# Patient Record
Sex: Female | Born: 1955
Health system: Southern US, Community
[De-identification: ages and names within clinical notes are randomized; demographics above are authoritative.]

## PROBLEM LIST (undated history)

## (undated) DIAGNOSIS — Z972 Presence of dental prosthetic device (complete) (partial): Secondary | ICD-10-CM

## (undated) DIAGNOSIS — E079 Disorder of thyroid, unspecified: Secondary | ICD-10-CM

## (undated) DIAGNOSIS — Z8719 Personal history of other diseases of the digestive system: Secondary | ICD-10-CM

## (undated) DIAGNOSIS — E119 Type 2 diabetes mellitus without complications: Secondary | ICD-10-CM

## (undated) DIAGNOSIS — K219 Gastro-esophageal reflux disease without esophagitis: Secondary | ICD-10-CM

## (undated) DIAGNOSIS — E785 Hyperlipidemia, unspecified: Secondary | ICD-10-CM

## (undated) DIAGNOSIS — F32A Depression, unspecified: Secondary | ICD-10-CM

## (undated) DIAGNOSIS — E039 Hypothyroidism, unspecified: Secondary | ICD-10-CM

## (undated) DIAGNOSIS — M1990 Unspecified osteoarthritis, unspecified site: Secondary | ICD-10-CM

## (undated) DIAGNOSIS — T4145XA Adverse effect of unspecified anesthetic, initial encounter: Secondary | ICD-10-CM

## (undated) DIAGNOSIS — R7303 Prediabetes: Secondary | ICD-10-CM

## (undated) DIAGNOSIS — I10 Essential (primary) hypertension: Secondary | ICD-10-CM

## (undated) DIAGNOSIS — R011 Cardiac murmur, unspecified: Secondary | ICD-10-CM

## (undated) DIAGNOSIS — F329 Major depressive disorder, single episode, unspecified: Secondary | ICD-10-CM

## (undated) DIAGNOSIS — T8859XA Other complications of anesthesia, initial encounter: Secondary | ICD-10-CM

## (undated) DIAGNOSIS — G5600 Carpal tunnel syndrome, unspecified upper limb: Secondary | ICD-10-CM

## (undated) HISTORY — PX: VAGINAL HYSTERECTOMY: SUR661

## (undated) HISTORY — DX: Hyperlipidemia, unspecified: E78.5

## (undated) HISTORY — PX: LYMPH NODE DISSECTION: SHX5087

## (undated) HISTORY — PX: KNEE SURGERY: SHX244

## (undated) HISTORY — DX: Depression, unspecified: F32.A

## (undated) HISTORY — DX: Major depressive disorder, single episode, unspecified: F32.9

## (undated) HISTORY — DX: Disorder of thyroid, unspecified: E07.9

## (undated) HISTORY — PX: TONSILLECTOMY: SUR1361

---

## 2005-08-16 ENCOUNTER — Ambulatory Visit: Payer: Self-pay | Admitting: Family Medicine

## 2008-06-11 ENCOUNTER — Ambulatory Visit: Payer: Self-pay | Admitting: Family Medicine

## 2008-11-29 ENCOUNTER — Ambulatory Visit: Payer: Self-pay | Admitting: Cardiovascular Disease

## 2010-03-12 HISTORY — PX: COLONOSCOPY: SHX174

## 2010-12-06 LAB — HM COLONOSCOPY

## 2011-03-13 HISTORY — PX: GASTRIC BYPASS: SHX52

## 2011-06-27 ENCOUNTER — Ambulatory Visit: Payer: Self-pay | Admitting: Family Medicine

## 2014-06-29 ENCOUNTER — Ambulatory Visit
Admit: 2014-06-29 | Disposition: A | Discharge status: Home / Self Care | Payer: Self-pay | Attending: Family Medicine | Admitting: Family Medicine

## 2015-01-05 ENCOUNTER — Ambulatory Visit (INDEPENDENT_AMBULATORY_CARE_PROVIDER_SITE_OTHER): Payer: BLUE CROSS/BLUE SHIELD | Admitting: Family Medicine

## 2015-01-05 ENCOUNTER — Encounter: Payer: Self-pay | Admitting: Family Medicine

## 2015-01-05 ENCOUNTER — Other Ambulatory Visit: Payer: Self-pay

## 2015-01-05 VITALS — BP 130/60 | HR 84 | Ht 63.0 in | Wt 176.0 lb

## 2015-01-05 DIAGNOSIS — E039 Hypothyroidism, unspecified: Secondary | ICD-10-CM | POA: Diagnosis not present

## 2015-01-05 DIAGNOSIS — J320 Chronic maxillary sinusitis: Secondary | ICD-10-CM

## 2015-01-05 DIAGNOSIS — E785 Hyperlipidemia, unspecified: Secondary | ICD-10-CM

## 2015-01-05 DIAGNOSIS — F3341 Major depressive disorder, recurrent, in partial remission: Secondary | ICD-10-CM | POA: Diagnosis not present

## 2015-01-05 DIAGNOSIS — I10 Essential (primary) hypertension: Secondary | ICD-10-CM

## 2015-01-05 MED ORDER — CITALOPRAM HYDROBROMIDE 10 MG PO TABS: 10.0000 mg | ORAL_TABLET | Freq: Every day | ORAL | Status: DC

## 2015-01-05 MED ORDER — SIMVASTATIN 20 MG PO TABS: 20.0000 mg | ORAL_TABLET | Freq: Every day | ORAL | Status: DC

## 2015-01-05 MED ORDER — HYDROCHLOROTHIAZIDE 25 MG PO TABS: 25.0000 mg | ORAL_TABLET | Freq: Every day | ORAL | Status: DC

## 2015-01-05 MED ORDER — LEVOTHYROXINE SODIUM 75 MCG PO TABS: 75.0000 ug | ORAL_TABLET | Freq: Every day | ORAL | Status: DC

## 2015-01-05 MED ORDER — DOXYCYCLINE HYCLATE 100 MG PO TABS: 100.0000 mg | ORAL_TABLET | Freq: Two times a day (BID) | ORAL | Status: DC

## 2015-01-05 NOTE — Progress Notes (Signed)
Name: Phyllis Wagner   MRN: [id number]    DOB: 02-12-1956   Date:01/05/2015       Progress Note  Subjective  Chief Complaint  Chief Complaint  Patient presents with  . Sinusitis    "wild onion taste in mouth and sense of smell"- taking otc sinus med- not helping  . Hyperlipidemia  . Hypothyroidism  . Depression    Sinusitis This is a new problem. The current episode started 1 to 4 weeks ago. The problem has been waxing and waning since onset. There has been no fever. Her pain is at a severity of 2/10. Associated symptoms include chills, congestion, coughing, headaches, sinus pressure, a sore throat and swollen glands. Pertinent negatives include no diaphoresis, ear pain, hoarse voice, neck pain, shortness of breath or sneezing. Past treatments include oral decongestants. The treatment provided no relief.  Hyperlipidemia This is a chronic problem. The current episode started more than 1 year ago. The problem is controlled. Recent lipid tests were reviewed and are normal. She has no history of chronic renal disease, diabetes, hypothyroidism, liver disease, obesity or nephrotic syndrome. Pertinent negatives include no chest pain, focal sensory loss, focal weakness, leg pain, myalgias or shortness of breath. Current antihyperlipidemic treatment includes statins. The current treatment provides mild improvement of lipids. There are no compliance problems.  Risk factors for coronary artery disease include dyslipidemia.  Depression      The patient presents with depression.  This is a chronic problem.  The current episode started more than 1 year ago.   The onset quality is gradual.   The problem occurs daily.  The problem has been gradually improving since onset.  Associated symptoms include headaches.  Associated symptoms include no decreased concentration, no fatigue, does not have insomnia, no decreased interest, no myalgias, not sad and no suicidal ideas.     The symptoms are aggravated  by nothing.  Past treatments include SSRIs - Selective serotonin reuptake inhibitors.  Compliance with treatment is good.  Past medical history includes thyroid problem, anxiety and depression.     Pertinent negatives include no chronic fatigue syndrome and no hypothyroidism. Thyroid Problem Symptoms include anxiety. Patient reports no cold intolerance, constipation, diaphoresis, diarrhea, fatigue, hoarse voice, menstrual problem, palpitations, weight gain or weight loss. The symptoms have been stable. Past treatments include levothyroxine. Her past medical history is significant for hyperlipidemia. There is no history of diabetes or heart failure.  Hypertension This is a chronic problem. The current episode started more than 1 year ago. The problem has been gradually improving since onset. Associated symptoms include anxiety and headaches. Pertinent negatives include no blurred vision, chest pain, malaise/fatigue, neck pain, palpitations or shortness of breath. Risk factors for coronary artery disease include dyslipidemia and obesity. Past treatments include diuretics. Hypertensive end-organ damage includes a thyroid problem. There is no history of angina, kidney disease, CAD/MI, CVA, heart failure, left ventricular hypertrophy, PVD, renovascular disease or retinopathy. There is no history of chronic renal disease.    No problem-specific assessment & plan notes found for this encounter.   Past Medical History  Diagnosis Date  . Depression   . Hyperlipidemia   . Thyroid disease     Past Surgical History  Procedure Laterality Date  . Vaginal hysterectomy    . Knee surgery Left   . Lymph node dissection    . Tonsillectomy    . Gastric bypass    . Colonoscopy  2012    cleared for 10 yrs- [facility]  Family History  Problem Relation Age of Onset  . Diabetes Maternal Grandmother     Social History   Social History  . Marital Status: Married    Spouse Name: N/A  . Number of  Children: N/A  . Years of Education: N/A   Occupational History  . Not on file.   Social History Main Topics  . Smoking status: Former Games developermoker  . Smokeless tobacco: Not on file  . Alcohol Use: 0.0 oz/week    0 Standard drinks or equivalent per week  . Drug Use: No  . Sexual Activity: Yes   Other Topics Concern  . Not on file   Social History Narrative  . No narrative on file    Allergies  Allergen Reactions  . Aspirin Other (See Comments)  . Penicillins Other (See Comments)     Review of Systems  Constitutional: Positive for chills. Negative for fever, weight loss, weight gain, malaise/fatigue, diaphoresis and fatigue.  HENT: Positive for congestion, sinus pressure and sore throat. Negative for ear discharge, ear pain, hoarse voice and sneezing.   Eyes: Negative for blurred vision.  Respiratory: Positive for cough. Negative for sputum production, shortness of breath and wheezing.   Cardiovascular: Negative for chest pain, palpitations and leg swelling.  Gastrointestinal: Negative for heartburn, nausea, abdominal pain, diarrhea, constipation, blood in stool and melena.  Genitourinary: Negative for dysuria, urgency, frequency, hematuria and menstrual problem.  Musculoskeletal: Negative for myalgias, back pain, joint pain and neck pain.  Skin: Negative for rash.  Neurological: Positive for headaches. Negative for dizziness, tingling, sensory change and focal weakness.  Endo/Heme/Allergies: Negative for environmental allergies, cold intolerance and polydipsia. Does not bruise/bleed easily.  Psychiatric/Behavioral: Positive for depression. Negative for suicidal ideas and decreased concentration. The patient is nervous/anxious. The patient does not have insomnia.      Objective  Filed Vitals:   01/05/15 0816  BP: 130/60  Pulse: 84  Height: 5\' 3"  (1.6 m)  Weight: 176 lb (79.833 kg)    Physical Exam  Constitutional: She is well-developed, well-nourished, and in no  distress. No distress.  HENT:  Head: Normocephalic and atraumatic.  Right Ear: External ear normal.  Left Ear: External ear normal.  Nose: Nose normal.  Mouth/Throat: Oropharynx is clear and moist.  Eyes: Conjunctivae and EOM are normal. Pupils are equal, round, and reactive to light. Right eye exhibits no discharge. Left eye exhibits no discharge.  Neck: Normal range of motion. Neck supple. No JVD present. No thyromegaly present.  Cardiovascular: Normal rate, regular rhythm, normal heart sounds and intact distal pulses.  Exam reveals no gallop and no friction rub.   No murmur heard. Pulmonary/Chest: Effort normal and breath sounds normal.  Abdominal: Soft. Bowel sounds are normal. She exhibits no mass. There is no tenderness. There is no guarding.  Musculoskeletal: Normal range of motion. She exhibits no edema.  Lymphadenopathy:    She has no cervical adenopathy.  Neurological: She is alert. She has normal reflexes.  Skin: Skin is warm and dry. She is not diaphoretic.  Psychiatric: Mood and affect normal.      Assessment & Plan  Problem List Items Addressed This Visit      Cardiovascular and Mediastinum   Essential hypertension - Primary   Relevant Medications   hydrochlorothiazide (HYDRODIURIL) 25 MG tablet   simvastatin (ZOCOR) 20 MG tablet   Other Relevant Orders   Renal Function Panel     Endocrine   Thyroid activity decreased   Relevant Medications   levothyroxine (  SYNTHROID, LEVOTHROID) 75 MCG tablet   Other Relevant Orders   TSH     Other   Hyperlipidemia   Relevant Medications   hydrochlorothiazide (HYDRODIURIL) 25 MG tablet   simvastatin (ZOCOR) 20 MG tablet   Other Relevant Orders   Lipid Profile   Recurrent major depressive disorder, in partial remission (HCC)   Relevant Medications   citalopram (CELEXA) 10 MG tablet    Other Visit Diagnoses    Chronic maxillary sinusitis        Relevant Medications    doxycycline (VIBRA-TABS) 100 MG tablet     Other Relevant Orders    Ambulatory referral to ENT         Dr. Elizabeth Sauer [facility]  01/05/2015

## 2015-01-06 LAB — RENAL FUNCTION PANEL
Albumin: 4.5 g/dL (ref 3.5–5.5)
BUN/Creatinine Ratio: 15 (ref 9–23)
BUN: 9 mg/dL (ref 6–24)
CO2: 29 mmol/L (ref 18–29)
Calcium: 9.5 mg/dL (ref 8.7–10.2)
Chloride: 97 mmol/L (ref 97–106)
Creatinine, Ser: 0.61 mg/dL (ref 0.57–1.00)
GFR calc Af Amer: 117 mL/min/{1.73_m2} (ref >59)
GFR calc non Af Amer: 102 mL/min/{1.73_m2} (ref >59)
Glucose: 133 mg/dL — ABNORMAL HIGH (ref 65–99)
Phosphorus: 3.3 mg/dL (ref 2.5–4.5)
Potassium: 4.2 mmol/L (ref 3.5–5.2)
Sodium: 141 mmol/L (ref 136–144)

## 2015-01-06 LAB — LIPID PANEL
Chol/HDL Ratio: 2.4 ratio units (ref 0.0–4.4)
Cholesterol, Total: 180 mg/dL (ref 100–199)
HDL: 75 mg/dL (ref >39)
LDL Calculated: 87 mg/dL (ref 0–99)
Triglycerides: 88 mg/dL (ref 0–149)
VLDL Cholesterol Cal: 18 mg/dL (ref 5–40)

## 2015-01-06 LAB — TSH: TSH: 6.51 u[IU]/mL — ABNORMAL HIGH (ref 0.450–4.500)

## 2015-01-06 MED ORDER — LEVOTHYROXINE SODIUM 88 MCG PO TABS: 88.0000 ug | ORAL_TABLET | Freq: Every day | ORAL | Status: DC

## 2015-01-06 NOTE — Addendum Note (Signed)
Addended by: Everitt AmberLYNCH, Karalina Tift L on: 01/06/2015 11:42 AM   Modules accepted: Orders, Medications

## 2015-01-18 ENCOUNTER — Ambulatory Visit (INDEPENDENT_AMBULATORY_CARE_PROVIDER_SITE_OTHER): Payer: BLUE CROSS/BLUE SHIELD | Admitting: Family Medicine

## 2015-01-18 ENCOUNTER — Encounter: Payer: Self-pay | Admitting: Family Medicine

## 2015-01-18 VITALS — BP 120/64 | HR 64 | Ht 63.0 in | Wt 174.0 lb

## 2015-01-18 DIAGNOSIS — L509 Urticaria, unspecified: Secondary | ICD-10-CM

## 2015-01-18 MED ORDER — PREDNISONE 10 MG PO TABS: ORAL_TABLET | ORAL | Status: DC

## 2015-01-18 MED ORDER — BETAMETHASONE VALERATE 0.1 % EX OINT: 1.0000 "application " | TOPICAL_OINTMENT | Freq: Two times a day (BID) | CUTANEOUS | Status: DC

## 2015-01-18 NOTE — Progress Notes (Signed)
Name: Phyllis HusbandsKathleen Boyer Lupinacci   MRN: [id number]    DOB: [birth year]   Date:01/18/2015       Progress Note  Subjective  Chief Complaint  Chief Complaint  Patient presents with  . Rash    was taking Biaxin- broke out into a rash, went to see Dr Andee PolesVaught and he changed to Avelox and a pred pak. Rash hasn't gotten any better.    Rash This is a new problem. The affected locations include the torso, chest, abdomen, left lower leg, right lowerleg, left arm, right arm, right axilla, left axilla, neck and head. The rash is characterized by redness. She was exposed to nothing. Pertinent negatives include no anorexia, congestion, cough, diarrhea, facial edema, fever, joint pain, shortness of breath or sore throat. Past treatments include antibiotics. The treatment provided no relief.    No problem-specific assessment & plan notes found for this encounter.   Past Medical History  Diagnosis Date  . Depression   . Hyperlipidemia   . Thyroid disease     Past Surgical History  Procedure Laterality Date  . Vaginal hysterectomy    . Knee surgery Left   . Lymph node dissection    . Tonsillectomy    . Gastric bypass    . Colonoscopy  2012    [facility] [place] [facility] yrs[facility]    Family History  Problem Relation Age of Onset  . Diabetes Maternal Grandmother     Social History   Social History  . Marital Status: Married    Spouse Name: N/A  . Number of Children: N/A  . Years of Education: N/A   Occupational History  . Not on file.   Social History Main Topics  . Smoking status: Former Games developermoker  . Smokeless tobacco: Not on file  . Alcohol Use: 0.0 oz/week    0 Standard drinks or equivalent per week  . Drug Use: No  . Sexual Activity: Yes   Other Topics Concern  . Not on file   Social History Narrative    Allergies  Allergen Reactions  . Aspirin Other (See Comments)  . Biaxin [Clarithromycin]   . Penicillins Other (See Comments)     Review of Systems  Constitutional:  Negative for fever, chills, weight loss and malaise/fatigue.  HENT: Negative for congestion, ear discharge, ear pain and sore throat.   Eyes: Negative for blurred vision.  Respiratory: Negative for cough, sputum production, shortness of breath and wheezing.   Cardiovascular: Negative for chest pain, palpitations and leg swelling.  Gastrointestinal: Negative for heartburn, nausea, abdominal pain, diarrhea, constipation, blood in stool, melena and anorexia.  Genitourinary: Negative for dysuria, urgency, frequency and hematuria.  Musculoskeletal: Negative for myalgias, back pain, joint pain and neck pain.  Skin: Positive for rash.  Neurological: Negative for dizziness, tingling, sensory change, focal weakness and headaches.  Endo/Heme/Allergies: Negative for environmental allergies and polydipsia. Does not bruise/bleed easily.  Psychiatric/Behavioral: Negative for depression and suicidal ideas. The patient is not nervous/anxious and does not have insomnia.      Objective  Filed Vitals:   01/18/15 1507  BP: 120/64  Pulse: 64  Height: 5\' 3"  (1.6 m)  Weight: 174 lb (78.926 kg)    Physical Exam  Constitutional: She is well-developed, well-nourished, and in no distress. No distress.  HENT:  Head: Normocephalic and atraumatic.  Right Ear: External ear normal.  Left Ear: External ear normal.  Nose: Nose normal.  Mouth/Throat: Oropharynx is clear and moist.  Eyes: Conjunctivae and EOM are normal.  Pupils are equal, round, and reactive to light. Right eye exhibits no discharge. Left eye exhibits no discharge.  Neck: Normal range of motion. Neck supple. No JVD present. No thyromegaly present.  Cardiovascular: Normal rate, regular rhythm, normal heart sounds and intact distal pulses.  Exam reveals no gallop and no friction rub.   No murmur heard. Pulmonary/Chest: Effort normal and breath sounds normal.  Abdominal: Soft. Bowel sounds are normal. She exhibits no mass. There is no tenderness.  There is no guarding.  Musculoskeletal: Normal range of motion. She exhibits no edema.  Lymphadenopathy:    She has no cervical adenopathy.  Neurological: She is alert. She has normal reflexes.  Skin: Skin is warm and dry. Rash noted. She is not diaphoretic. There is erythema.  Psychiatric: Mood and affect normal.  Nursing note and vitals reviewed.     Assessment & Plan  Problem List Items Addressed This Visit    None    Visit Diagnoses    Urticaria    -  Primary    Relevant Medications    betamethasone valerate ointment (VALISONE) 0.1 %    Other Relevant Orders    Ambulatory referral to Dermatology         Dr. Elizabeth Sauer [facility]  01/18/2015

## 2015-02-02 ENCOUNTER — Other Ambulatory Visit: Payer: Self-pay | Admitting: Family Medicine

## 2015-02-15 ENCOUNTER — Ambulatory Visit (INDEPENDENT_AMBULATORY_CARE_PROVIDER_SITE_OTHER): Payer: BLUE CROSS/BLUE SHIELD | Admitting: Family Medicine

## 2015-02-15 ENCOUNTER — Encounter: Payer: Self-pay | Admitting: Family Medicine

## 2015-02-15 VITALS — BP 120/70 | HR 72 | Ht 63.0 in | Wt 173.0 lb

## 2015-02-15 DIAGNOSIS — J4521 Mild intermittent asthma with (acute) exacerbation: Secondary | ICD-10-CM

## 2015-02-15 DIAGNOSIS — J40 Bronchitis, not specified as acute or chronic: Secondary | ICD-10-CM

## 2015-02-15 MED ORDER — ALBUTEROL SULFATE HFA 108 (90 BASE) MCG/ACT IN AERS: 2.0000 | INHALATION_SPRAY | Freq: Four times a day (QID) | RESPIRATORY_TRACT | Status: DC | PRN

## 2015-02-15 MED ORDER — LEVOFLOXACIN 500 MG PO TABS: 500.0000 mg | ORAL_TABLET | Freq: Every day | ORAL | Status: DC

## 2015-02-15 NOTE — Progress Notes (Signed)
Name: Phyllis Wagner   MRN: [id number]    DOB: [birth year]   Date:02/15/2015       Progress Note  Subjective  Chief Complaint  Chief Complaint  Patient presents with  . Bronchitis    coughing little production up- wheezing when going upstairs    Cough This is a recurrent problem. The current episode started in the past 7 days. The problem has been waxing and waning. The problem occurs every few hours. The cough is non-productive. Associated symptoms include chills, headaches, nasal congestion, postnasal drip, a sore throat, shortness of breath and wheezing. Pertinent negatives include no chest pain, ear congestion, ear pain, fever, heartburn, hemoptysis, myalgias, rash, rhinorrhea, sweats or weight loss. The symptoms are aggravated by cold air and exercise. She has tried oral steroids for the symptoms. The treatment provided mild relief. Her past medical history is significant for asthma and bronchitis. There is no history of environmental allergies.    No problem-specific assessment & plan notes found for this encounter.   Past Medical History  Diagnosis Date  . Depression   . Hyperlipidemia   . Thyroid disease     Past Surgical History  Procedure Laterality Date  . Vaginal hysterectomy    . Knee surgery Left   . Lymph node dissection    . Tonsillectomy    . Gastric bypass    . Colonoscopy  2012    cleared for 10 yrs- [place] [facility]    Family History  Problem Relation Age of Onset  . Diabetes Maternal Grandmother     Social History   Social History  . Marital Status: Married    Spouse Name: N/A  . Number of Children: N/A  . Years of Education: N/A   Occupational History  . Not on file.   Social History Main Topics  . Smoking status: Former Games developer  . Smokeless tobacco: Not on file  . Alcohol Use: 0.0 oz/week    0 Standard drinks or equivalent per week  . Drug Use: No  . Sexual Activity: Yes   Other Topics Concern  . Not on file   Social History  Narrative    Allergies  Allergen Reactions  . Aspirin Other (See Comments)  . Biaxin [Clarithromycin]   . Penicillins Other (See Comments)     Review of Systems  Constitutional: Positive for chills. Negative for fever, weight loss and malaise/fatigue.  HENT: Positive for postnasal drip and sore throat. Negative for ear discharge, ear pain and rhinorrhea.   Eyes: Negative for blurred vision.  Respiratory: Positive for cough, shortness of breath and wheezing. Negative for hemoptysis and sputum production.   Cardiovascular: Negative for chest pain, palpitations and leg swelling.  Gastrointestinal: Negative for heartburn, nausea, abdominal pain, diarrhea, constipation, blood in stool and melena.  Genitourinary: Negative for dysuria, urgency, frequency and hematuria.  Musculoskeletal: Negative for myalgias, back pain, joint pain and neck pain.  Skin: Negative for rash.  Neurological: Positive for headaches. Negative for dizziness, tingling, sensory change and focal weakness.  Endo/Heme/Allergies: Negative for environmental allergies and polydipsia. Does not bruise/bleed easily.  Psychiatric/Behavioral: Negative for depression and suicidal ideas. The patient is not nervous/anxious and does not have insomnia.      Objective  Filed Vitals:   02/15/15 0817  BP: 120/70  Pulse: 72  Height:  (1.6 m)  Weight: 173 lb (78.472 kg)    Physical Exam  Constitutional: She is well-developed, well-nourished, and in no distress. No distress.  HENT:  Head: Normocephalic  and atraumatic.  Right Ear: External ear normal.  Left Ear: External ear normal.  Nose: Nose normal.  Mouth/Throat: Oropharynx is clear and moist.  Eyes: Conjunctivae and EOM are normal. Pupils are equal, round, and reactive to light. Right eye exhibits no discharge. Left eye exhibits no discharge.  Neck: Normal range of motion. Neck supple. No JVD present. No thyromegaly present.  Cardiovascular: Normal rate, regular  rhythm, normal heart sounds and intact distal pulses.  Exam reveals no gallop and no friction rub.   No murmur heard. Pulmonary/Chest: Effort normal. No respiratory distress. She has wheezes. She has no rales.  Increased I/E   Abdominal: Soft. Bowel sounds are normal. She exhibits no mass. There is no tenderness. There is no guarding.  Musculoskeletal: Normal range of motion. She exhibits no edema.  Lymphadenopathy:    She has no cervical adenopathy.  Neurological: She is alert. She has normal reflexes.  Skin: Skin is warm and dry. She is not diaphoretic.  Psychiatric: Mood and affect normal.  Nursing note and vitals reviewed.     Assessment & Plan  Problem List Items Addressed This Visit    None    Visit Diagnoses    Bronchitis    -  Primary    Relevant Medications    levofloxacin (LEVAQUIN) 500 MG tablet    Reactive airway disease, mild intermittent, with acute exacerbation        Relevant Medications    albuterol (PROVENTIL HFA;VENTOLIN HFA) 108 (90 BASE) MCG/ACT inhaler         Dr. Hayden Rasmusseneanna Jones [facility]  02/15/2015

## 2015-03-04 ENCOUNTER — Other Ambulatory Visit: Payer: Self-pay | Admitting: Family Medicine

## 2015-03-15 DIAGNOSIS — J320 Chronic maxillary sinusitis: Secondary | ICD-10-CM | POA: Insufficient documentation

## 2015-03-15 DIAGNOSIS — J310 Chronic rhinitis: Secondary | ICD-10-CM | POA: Insufficient documentation

## 2015-03-15 DIAGNOSIS — J4520 Mild intermittent asthma, uncomplicated: Secondary | ICD-10-CM | POA: Insufficient documentation

## 2015-04-24 ENCOUNTER — Other Ambulatory Visit: Payer: Self-pay | Admitting: Family Medicine

## 2015-07-15 DIAGNOSIS — L728 Other follicular cysts of the skin and subcutaneous tissue: Secondary | ICD-10-CM | POA: Diagnosis not present

## 2015-07-15 DIAGNOSIS — R238 Other skin changes: Secondary | ICD-10-CM | POA: Diagnosis not present

## 2015-08-01 DIAGNOSIS — L570 Actinic keratosis: Secondary | ICD-10-CM | POA: Diagnosis not present

## 2015-09-08 ENCOUNTER — Ambulatory Visit (INDEPENDENT_AMBULATORY_CARE_PROVIDER_SITE_OTHER): Payer: BLUE CROSS/BLUE SHIELD | Admitting: Family Medicine

## 2015-09-08 ENCOUNTER — Encounter: Payer: Self-pay | Admitting: Family Medicine

## 2015-09-08 VITALS — BP 120/62 | HR 72 | Ht 63.0 in | Wt 168.0 lb

## 2015-09-08 DIAGNOSIS — L259 Unspecified contact dermatitis, unspecified cause: Secondary | ICD-10-CM | POA: Diagnosis not present

## 2015-09-08 DIAGNOSIS — E039 Hypothyroidism, unspecified: Secondary | ICD-10-CM | POA: Diagnosis not present

## 2015-09-08 DIAGNOSIS — R5383 Other fatigue: Secondary | ICD-10-CM

## 2015-09-08 DIAGNOSIS — E785 Hyperlipidemia, unspecified: Secondary | ICD-10-CM | POA: Diagnosis not present

## 2015-09-08 DIAGNOSIS — I10 Essential (primary) hypertension: Secondary | ICD-10-CM | POA: Diagnosis not present

## 2015-09-08 DIAGNOSIS — Z23 Encounter for immunization: Secondary | ICD-10-CM

## 2015-09-08 DIAGNOSIS — F3341 Major depressive disorder, recurrent, in partial remission: Secondary | ICD-10-CM

## 2015-09-08 DIAGNOSIS — K219 Gastro-esophageal reflux disease without esophagitis: Secondary | ICD-10-CM

## 2015-09-08 MED ORDER — CITALOPRAM HYDROBROMIDE 10 MG PO TABS: 10.0000 mg | ORAL_TABLET | Freq: Every day | ORAL | Status: DC

## 2015-09-08 MED ORDER — LEVOTHYROXINE SODIUM 88 MCG PO TABS: ORAL_TABLET | ORAL | Status: DC

## 2015-09-08 MED ORDER — TRIAMCINOLONE ACETONIDE 0.1 % EX CREA: 1.0000 "application " | TOPICAL_CREAM | Freq: Two times a day (BID) | CUTANEOUS | Status: DC

## 2015-09-08 MED ORDER — HYDROCHLOROTHIAZIDE 25 MG PO TABS: 25.0000 mg | ORAL_TABLET | Freq: Every day | ORAL | Status: DC

## 2015-09-08 MED ORDER — PANTOPRAZOLE SODIUM 40 MG PO TBEC: 40.0000 mg | DELAYED_RELEASE_TABLET | Freq: Every day | ORAL | Status: DC

## 2015-09-08 MED ORDER — SIMVASTATIN 20 MG PO TABS: 20.0000 mg | ORAL_TABLET | Freq: Every day | ORAL | Status: DC

## 2015-09-08 NOTE — Progress Notes (Signed)
Name: Phyllis Wagner   MRN: [id number]    DOB: 1955-11-27   Date:09/08/2015       Progress Note  Subjective  Chief Complaint  Chief Complaint  Patient presents with  . Hypertension  . Hyperlipidemia  . Depression  . Hypothyroidism  . Gastroesophageal Reflux    Hypertension This is a chronic problem. The current episode started more than 1 year ago. The problem has been waxing and waning since onset. The problem is controlled. Pertinent negatives include no anxiety, blurred vision, chest pain, headaches, malaise/fatigue, neck pain, orthopnea, palpitations, peripheral edema, PND, shortness of breath or sweats. There are no associated agents to hypertension. There are no known risk factors for coronary artery disease. Past treatments include diuretics. The current treatment provides mild improvement. There are no compliance problems.  Hypertensive end-organ damage includes a thyroid problem. There is no history of angina, kidney disease, CAD/MI, CVA, heart failure, left ventricular hypertrophy, PVD, renovascular disease or retinopathy. There is no history of chronic renal disease or a hypertension causing med.  Hyperlipidemia This is a chronic problem. The current episode started more than 1 year ago. The problem is controlled. Recent lipid tests were reviewed and are normal. She has no history of chronic renal disease, diabetes, hypothyroidism, liver disease, obesity or nephrotic syndrome. There are no known factors aggravating her hyperlipidemia. Pertinent negatives include no chest pain, focal weakness, myalgias or shortness of breath. Current antihyperlipidemic treatment includes statins. The current treatment provides mild improvement of lipids. There are no compliance problems.  Risk factors for coronary artery disease include dyslipidemia.  Depression      The patient presents with depression.  This is a chronic problem.  The current episode started more than 1 year ago.   The onset  quality is gradual.   The problem occurs intermittently.  The problem has been waxing and waning since onset.  Associated symptoms include fatigue.  Associated symptoms include no decreased concentration, no helplessness, no hopelessness, does not have insomnia, not irritable, no restlessness, no decreased interest, no appetite change, no body aches, no myalgias, no headaches, no indigestion, not sad and no suicidal ideas.     The symptoms are aggravated by work stress.  Past treatments include SSRIs - Selective serotonin reuptake inhibitors.  Compliance with treatment is good.  Past medical history includes thyroid problem and depression.     Pertinent negatives include no chronic fatigue syndrome, no hypothyroidism, no dementia and no anxiety. Gastroesophageal Reflux She complains of heartburn. She reports no abdominal pain, no belching, no chest pain, no coughing, no nausea, no sore throat or no wheezing. This is a chronic problem. The current episode started more than 1 year ago. The symptoms are aggravated by certain foods. Associated symptoms include fatigue and weight loss. Pertinent negatives include no melena. She has tried a PPI for the symptoms. The treatment provided mild relief.  Thyroid Problem Presents for follow-up visit. Symptoms include anxiety, cold intolerance, fatigue and weight loss. Patient reports no constipation, diaphoresis, diarrhea, dry skin, hair loss, heat intolerance, palpitations or weight gain. Past treatments include levothyroxine. Her past medical history is significant for hyperlipidemia. There is no history of diabetes or heart failure.    No problem-specific assessment & plan notes found for this encounter.   Past Medical History  Diagnosis Date  . Depression   . Hyperlipidemia   . Thyroid disease     Past Surgical History  Procedure Laterality Date  . Vaginal hysterectomy    . Knee  surgery Left   . Lymph node dissection    . Tonsillectomy    . Gastric  bypass    . Colonoscopy  2012    [facility] [place] [facility] yrs[facility]    Family History  Problem Relation Age of Onset  . Diabetes Maternal Grandmother     Social History   Social History  . Marital Status: Married    Spouse Name: N/A  . Number of Children: N/A  . Years of Education: N/A   Occupational History  . Not on file.   Social History Main Topics  . Smoking status: Former Games developermoker  . Smokeless tobacco: Not on file  . Alcohol Use: 0.0 oz/week    0 Standard drinks or equivalent per week  . Drug Use: No  . Sexual Activity: Yes   Other Topics Concern  . Not on file   Social History Narrative    Allergies  Allergen Reactions  . Aspirin Other (See Comments)  . Biaxin [Clarithromycin]   . Penicillins Other (See Comments)     Review of Systems  Constitutional: Positive for weight loss and fatigue. Negative for fever, chills, weight gain, malaise/fatigue, diaphoresis and appetite change.  HENT: Negative for ear discharge, ear pain and sore throat.   Eyes: Negative for blurred vision.  Respiratory: Negative for cough, sputum production, shortness of breath and wheezing.   Cardiovascular: Negative for chest pain, palpitations, orthopnea, leg swelling and PND.  Gastrointestinal: Positive for heartburn. Negative for nausea, abdominal pain, diarrhea, constipation, blood in stool and melena.  Genitourinary: Negative for dysuria, urgency, frequency and hematuria.  Musculoskeletal: Negative for myalgias, back pain, joint pain and neck pain.  Skin: Negative for rash.  Neurological: Negative for dizziness, tingling, sensory change, focal weakness and headaches.  Endo/Heme/Allergies: Positive for cold intolerance. Negative for environmental allergies, heat intolerance and polydipsia. Does not bruise/bleed easily.  Psychiatric/Behavioral: Positive for depression. Negative for suicidal ideas and decreased concentration. The patient is nervous/anxious. The patient does not have  insomnia.      Objective  Filed Vitals:   09/08/15 0847  BP: 120/62  Pulse: 72  Height: 5\' 3"  (1.6 m)  Weight: 168 lb (76.204 kg)    Physical Exam  Constitutional: She is well-developed, well-nourished, and in no distress. She is not irritable. No distress.  HENT:  Head: Normocephalic and atraumatic.  Right Ear: External ear normal.  Left Ear: External ear normal.  Nose: Nose normal.  Mouth/Throat: Oropharynx is clear and moist.  Eyes: Conjunctivae and EOM are normal. Pupils are equal, round, and reactive to light. Right eye exhibits no discharge. Left eye exhibits no discharge.  Neck: Normal range of motion. Neck supple. No JVD present. No thyromegaly present.  Cardiovascular: Normal rate, regular rhythm, normal heart sounds and intact distal pulses.  Exam reveals no gallop and no friction rub.   No murmur heard. Pulmonary/Chest: Effort normal and breath sounds normal. She has no wheezes. She has no rales.  Abdominal: Soft. Bowel sounds are normal. She exhibits no mass. There is no tenderness. There is no guarding.  Musculoskeletal: Normal range of motion. She exhibits no edema.  Lymphadenopathy:    She has no cervical adenopathy.  Neurological: She is alert.  Skin: Skin is warm and dry. She is not diaphoretic.  Psychiatric: Mood and affect normal.  Nursing note and vitals reviewed.     Assessment & Plan  Problem List Items Addressed This Visit      Cardiovascular and Mediastinum   Essential hypertension -  Primary   Relevant Medications   hydrochlorothiazide (HYDRODIURIL) 25 MG tablet   simvastatin (ZOCOR) 20 MG tablet   Other Relevant Orders   Renal Function Panel     Endocrine   Thyroid activity decreased   Relevant Medications   levothyroxine (SYNTHROID, LEVOTHROID) 88 MCG tablet   Other Relevant Orders   TSH     Other   Hyperlipidemia   Relevant Medications   hydrochlorothiazide (HYDRODIURIL) 25 MG tablet   simvastatin (ZOCOR) 20 MG tablet   Other  Relevant Orders   Lipid Profile   Recurrent major depressive disorder, in partial remission (HCC)   Relevant Medications   citalopram (CELEXA) 10 MG tablet    Other Visit Diagnoses    Gastroesophageal reflux disease, esophagitis presence not specified        Relevant Medications    pantoprazole (PROTONIX) 40 MG tablet    Other fatigue        Relevant Orders    CBC w/Diff/Platelet    TSH    Contact dermatitis        Relevant Medications    triamcinolone cream (KENALOG) 0.1 %    Need for Tdap vaccination        Relevant Orders    Tdap vaccine greater than or equal to 7yo IM (Completed)         Dr. Hayden Rasmusseneanna Shifra Swartzentruber [facility]  09/08/2015

## 2015-09-09 LAB — CBC WITH DIFFERENTIAL/PLATELET
Basophils Absolute: 0.1 10*3/uL (ref 0.0–0.2)
Basos: 1 %
EOS (ABSOLUTE): 0.2 10*3/uL (ref 0.0–0.4)
Eos: 3 %
Hematocrit: 39.9 % (ref 34.0–46.6)
Hemoglobin: 13.8 g/dL (ref 11.1–15.9)
Immature Grans (Abs): 0 10*3/uL (ref 0.0–0.1)
Immature Granulocytes: 0 %
Lymphocytes Absolute: 1.7 10*3/uL (ref 0.7–3.1)
Lymphs: 38 %
MCH: 30.6 pg (ref 26.6–33.0)
MCHC: 34.6 g/dL (ref 31.5–35.7)
MCV: 89 fL (ref 79–97)
Monocytes Absolute: 0.4 10*3/uL (ref 0.1–0.9)
Monocytes: 8 %
Neutrophils Absolute: 2.2 10*3/uL (ref 1.4–7.0)
Neutrophils: 50 %
Platelets: 229 10*3/uL (ref 150–379)
RBC: 4.51 x10E6/uL (ref 3.77–5.28)
RDW: 13.3 % (ref 12.3–15.4)
WBC: 4.6 10*3/uL (ref 3.4–10.8)

## 2015-09-09 LAB — RENAL FUNCTION PANEL
Albumin: 4.8 g/dL (ref 3.5–5.5)
BUN/Creatinine Ratio: 17 (ref 9–23)
BUN: 11 mg/dL (ref 6–24)
CO2: 26 mmol/L (ref 18–29)
Calcium: 9.5 mg/dL (ref 8.7–10.2)
Chloride: 96 mmol/L (ref 96–106)
Creatinine, Ser: 0.64 mg/dL (ref 0.57–1.00)
GFR calc Af Amer: 115 mL/min/{1.73_m2} (ref >59)
GFR calc non Af Amer: 99 mL/min/{1.73_m2} (ref >59)
Glucose: 130 mg/dL — ABNORMAL HIGH (ref 65–99)
Phosphorus: 3.7 mg/dL (ref 2.5–4.5)
Potassium: 4.4 mmol/L (ref 3.5–5.2)
Sodium: 140 mmol/L (ref 134–144)

## 2015-09-09 LAB — LIPID PANEL
Chol/HDL Ratio: 2.1 ratio units (ref 0.0–4.4)
Cholesterol, Total: 180 mg/dL (ref 100–199)
HDL: 86 mg/dL (ref >39)
LDL Calculated: 80 mg/dL (ref 0–99)
Triglycerides: 71 mg/dL (ref 0–149)
VLDL Cholesterol Cal: 14 mg/dL (ref 5–40)

## 2015-09-09 LAB — TSH: TSH: 4.12 u[IU]/mL (ref 0.450–4.500)

## 2015-10-20 DIAGNOSIS — H04123 Dry eye syndrome of bilateral lacrimal glands: Secondary | ICD-10-CM | POA: Diagnosis not present

## 2015-11-08 DIAGNOSIS — H40033 Anatomical narrow angle, bilateral: Secondary | ICD-10-CM | POA: Diagnosis not present

## 2015-11-22 DIAGNOSIS — H179 Unspecified corneal scar and opacity: Secondary | ICD-10-CM | POA: Diagnosis not present

## 2015-12-15 DIAGNOSIS — H2513 Age-related nuclear cataract, bilateral: Secondary | ICD-10-CM | POA: Diagnosis not present

## 2016-01-04 DIAGNOSIS — H1789 Other corneal scars and opacities: Secondary | ICD-10-CM | POA: Diagnosis not present

## 2016-01-04 DIAGNOSIS — H2513 Age-related nuclear cataract, bilateral: Secondary | ICD-10-CM | POA: Diagnosis not present

## 2016-01-31 DIAGNOSIS — H1789 Other corneal scars and opacities: Secondary | ICD-10-CM | POA: Diagnosis not present

## 2016-02-16 ENCOUNTER — Ambulatory Visit (INDEPENDENT_AMBULATORY_CARE_PROVIDER_SITE_OTHER): Payer: BLUE CROSS/BLUE SHIELD | Admitting: Family Medicine

## 2016-02-16 VITALS — BP 116/82 | HR 74 | Temp 98.3°F | Ht 63.0 in | Wt 169.0 lb

## 2016-02-16 DIAGNOSIS — J0100 Acute maxillary sinusitis, unspecified: Secondary | ICD-10-CM

## 2016-02-16 DIAGNOSIS — J40 Bronchitis, not specified as acute or chronic: Secondary | ICD-10-CM | POA: Diagnosis not present

## 2016-02-16 MED ORDER — GUAIFENESIN-CODEINE 100-10 MG/5ML PO SYRP: 5.0000 mL | ORAL_SOLUTION | Freq: Three times a day (TID) | ORAL | 0 refills | Status: DC | PRN

## 2016-02-16 MED ORDER — AZITHROMYCIN 250 MG PO TABS: ORAL_TABLET | ORAL | 0 refills | Status: DC

## 2016-02-16 NOTE — Progress Notes (Signed)
Name: Phyllis HusbandsKathleen Boyer Wagner   MRN: [id number]    DOB: 1956-01-21   Date:02/16/2016       Progress Note  Subjective  Chief Complaint  Chief Complaint  Patient presents with  . Sinus Problem    pt stated sinus pressure, cough, sore throat for about 1 week.    Sinus Problem  This is a new problem. The current episode started in the past 7 days. The problem has been waxing and waning since onset. There has been no fever. Her pain is at a severity of 3/10. The pain is mild. Associated symptoms include congestion, ear pain, headaches and sinus pressure. Pertinent negatives include no chills, coughing, diaphoresis, hoarse voice, neck pain, shortness of breath or sore throat. Past treatments include acetaminophen (mucinex). The treatment provided moderate relief.    No problem-specific Assessment & Plan notes found for this encounter.   Past Medical History:  Diagnosis Date  . Depression   . Hyperlipidemia   . Thyroid disease     Past Surgical History:  Procedure Laterality Date  . COLONOSCOPY  2012   [facility]rs[facility]  . GASTRIC BYPASS    . KNEE SURGERY Left   . LYMPH NODE DISSECTION    . TONSILLECTOMY    . VAGINAL HYSTERECTOMY      Family History  Problem Relation Age of Onset  . Diabetes Maternal Grandmother     Social History   Social History  . Marital status: Married    Spouse name: N/A  . Number of children: N/A  . Years of education: N/A   Occupational History  . Not on file.   Social History Main Topics  . Smoking status: Former Games developermoker  . Smokeless tobacco: Not on file  . Alcohol use 0.0 oz/week  . Drug use: No  . Sexual activity: Yes   Other Topics Concern  . Not on file   Social History Narrative  . No narrative on file    Allergies  Allergen Reactions  . Aspirin Other (See Comments)  . Biaxin [Clarithromycin]   . Penicillins Other (See Comments)  . Sulfa Antibiotics Hives     Review of Systems  Constitutional: Negative  for chills, diaphoresis, fever, malaise/fatigue and weight loss.  HENT: Positive for congestion, ear pain and sinus pressure. Negative for ear discharge, hoarse voice, sore throat and tinnitus.   Eyes: Negative for blurred vision.  Respiratory: Negative for cough, sputum production, shortness of breath and wheezing.   Cardiovascular: Negative for chest pain, palpitations and leg swelling.  Gastrointestinal: Negative for abdominal pain, blood in stool, constipation, diarrhea, heartburn, melena and nausea.  Genitourinary: Negative for dysuria, frequency, hematuria and urgency.  Musculoskeletal: Negative for back pain, joint pain, myalgias and neck pain.  Skin: Negative for rash.  Neurological: Positive for headaches. Negative for dizziness, tingling, sensory change and focal weakness.  Endo/Heme/Allergies: Negative for environmental allergies and polydipsia. Does not bruise/bleed easily.  Psychiatric/Behavioral: Negative for depression and suicidal ideas. The patient is not nervous/anxious and does not have insomnia.      Objective  Vitals:   02/16/16 1049  BP: 116/82  Pulse: 74  Temp: 98.3 F (36.8 C)  SpO2: 98%  Weight: 169 lb (76.7 kg)  Height: 5\' 3"  (1.6 m)    Physical Exam  Constitutional: She is well-developed, well-nourished, and in no distress. No distress.  HENT:  Head: Normocephalic and atraumatic.  Right Ear: External ear normal.  Left Ear: External ear normal.  Nose: Nose normal.  Mouth/Throat: Oropharynx is clear and moist.  Eyes: Conjunctivae and EOM are normal. Pupils are equal, round, and reactive to light. Right eye exhibits no discharge. Left eye exhibits no discharge.  Neck: Normal range of motion. Neck supple. No JVD present. No thyromegaly present.  Cardiovascular: Normal rate, regular rhythm, normal heart sounds and intact distal pulses.  Exam reveals no gallop and no friction rub.   No murmur heard. Pulmonary/Chest: Effort normal and breath sounds normal.   Abdominal: Soft. Bowel sounds are normal. She exhibits no mass. There is no tenderness. There is no guarding.  Musculoskeletal: Normal range of motion. She exhibits no edema.  Lymphadenopathy:    She has no cervical adenopathy.  Neurological: She is alert.  Skin: Skin is warm and dry. She is not diaphoretic.  Psychiatric: Mood and affect normal.  Nursing note and vitals reviewed.     Assessment & Plan  Problem List Items Addressed This Visit    None    Visit Diagnoses    Acute maxillary sinusitis, recurrence not specified    -  Primary   Relevant Medications   azithromycin (ZITHROMAX) 250 MG tablet   guaiFENesin-codeine (ROBITUSSIN AC) 100-10 MG/5ML syrup   Bronchitis       Relevant Medications   guaiFENesin-codeine (ROBITUSSIN AC) 100-10 MG/5ML syrup        Dr. Hayden Rasmusseneanna Jones [facility]  02/16/16

## 2016-02-22 ENCOUNTER — Other Ambulatory Visit: Payer: Self-pay

## 2016-02-24 ENCOUNTER — Other Ambulatory Visit: Payer: Self-pay

## 2016-02-24 MED ORDER — DOXYCYCLINE HYCLATE 100 MG PO TABS: 100.0000 mg | ORAL_TABLET | Freq: Two times a day (BID) | ORAL | 0 refills | Status: DC

## 2016-03-12 HISTORY — PX: EYE SURGERY: SHX253

## 2016-03-22 DIAGNOSIS — H16223 Keratoconjunctivitis sicca, not specified as Sjogren's, bilateral: Secondary | ICD-10-CM | POA: Diagnosis not present

## 2016-03-22 DIAGNOSIS — H2513 Age-related nuclear cataract, bilateral: Secondary | ICD-10-CM | POA: Diagnosis not present

## 2016-03-22 DIAGNOSIS — H1859 Other hereditary corneal dystrophies: Secondary | ICD-10-CM | POA: Diagnosis not present

## 2016-04-10 ENCOUNTER — Ambulatory Visit (INDEPENDENT_AMBULATORY_CARE_PROVIDER_SITE_OTHER): Payer: BLUE CROSS/BLUE SHIELD | Admitting: Family Medicine

## 2016-04-10 ENCOUNTER — Encounter: Payer: Self-pay | Admitting: Family Medicine

## 2016-04-10 VITALS — BP 120/54 | HR 64 | Temp 98.0°F | Ht 63.0 in | Wt 171.0 lb

## 2016-04-10 DIAGNOSIS — E860 Dehydration: Secondary | ICD-10-CM

## 2016-04-10 DIAGNOSIS — H8309 Labyrinthitis, unspecified ear: Secondary | ICD-10-CM

## 2016-04-10 DIAGNOSIS — R739 Hyperglycemia, unspecified: Secondary | ICD-10-CM | POA: Diagnosis not present

## 2016-04-10 MED ORDER — MECLIZINE HCL 25 MG PO TABS: 25.0000 mg | ORAL_TABLET | Freq: Three times a day (TID) | ORAL | 0 refills | Status: DC | PRN

## 2016-04-10 MED ORDER — AZITHROMYCIN 250 MG PO TABS: ORAL_TABLET | ORAL | 0 refills | Status: DC

## 2016-04-10 NOTE — Progress Notes (Signed)
Name: Phyllis Wagner   MRN: [id number]    DOB: 10-06-1955   Date:04/11/2016       Progress Note  Subjective  Chief Complaint  Chief Complaint  Patient presents with  . Dizziness    feels like "I'm swaying more to the right, but back and forth"- started last night when she got up off of couch. Gets nauseated when stands up/ room is spinning    Dizziness  This is a new problem. The current episode started yesterday. The problem occurs constantly. The problem has been waxing and waning. Associated symptoms include congestion, nausea and vertigo. Pertinent negatives include no abdominal pain, anorexia, arthralgias, change in bowel habit, chest pain, chills, coughing, diaphoresis, fatigue, fever, headaches, joint swelling, myalgias, neck pain, numbness, rash, sore throat, swollen glands, urinary symptoms, visual change, vomiting or weakness. The symptoms are aggravated by bending (turn head). Treatments tried: flonase. The treatment provided mild relief.    No problem-specific Assessment & Plan notes found for this encounter.   Past Medical History:  Diagnosis Date  . Depression   . Hyperlipidemia   . Thyroid disease     Past Surgical History:  Procedure Laterality Date  . COLONOSCOPY  2012   cleared for 10 yrs- [facility]  . GASTRIC BYPASS    . KNEE SURGERY Left   . LYMPH NODE DISSECTION    . TONSILLECTOMY    . VAGINAL HYSTERECTOMY      Family History  Problem Relation Age of Onset  . Diabetes Maternal Grandmother     Social History   Social History  . Marital status: Married    Spouse name: N/A  . Number of children: N/A  . Years of education: N/A   Occupational History  . Not on file.   Social History Main Topics  . Smoking status: Former Games developer  . Smokeless tobacco: Never Used  . Alcohol use 0.0 oz/week  . Drug use: No  . Sexual activity: Yes   Other Topics Concern  . Not on file   Social History Narrative  . No narrative on file     Allergies  Allergen Reactions  . Aspirin Other (See Comments)  . Biaxin [Clarithromycin]   . Penicillins Other (See Comments)  . Sulfa Antibiotics Hives     Review of Systems  Constitutional: Negative for chills, diaphoresis, fatigue, fever, malaise/fatigue and weight loss.  HENT: Positive for congestion, sinus pain and tinnitus. Negative for ear discharge, ear pain, hearing loss, nosebleeds and sore throat.   Eyes: Negative for blurred vision.  Respiratory: Negative for cough, sputum production, shortness of breath, wheezing and stridor.   Cardiovascular: Negative for chest pain, palpitations and leg swelling.  Gastrointestinal: Positive for nausea. Negative for abdominal pain, anorexia, blood in stool, change in bowel habit, constipation, diarrhea, heartburn, melena and vomiting.  Genitourinary: Negative for dysuria, frequency, hematuria and urgency.  Musculoskeletal: Negative for arthralgias, back pain, joint pain, joint swelling, myalgias and neck pain.  Skin: Negative for rash.  Neurological: Positive for dizziness and vertigo. Negative for tingling, sensory change, focal weakness, weakness, numbness and headaches.  Endo/Heme/Allergies: Negative for environmental allergies and polydipsia. Does not bruise/bleed easily.  Psychiatric/Behavioral: Negative for depression and suicidal ideas. The patient is not nervous/anxious and does not have insomnia.      Objective  Vitals:   04/10/16 1532  BP: (!) 120/54  Pulse: 64  Temp: 98 F (36.7 C)  TempSrc: Oral  Weight: 171 lb (77.6 kg)  Height: 5\' 3"  (1.6 m)  Physical Exam  Constitutional: She is well-developed, well-nourished, and in no distress. No distress.  HENT:  Head: Normocephalic and atraumatic.  Right Ear: External ear normal.  Left Ear: External ear normal.  Nose: Nose normal.  Mouth/Throat: Oropharynx is clear and moist.  Eyes: Conjunctivae and EOM are normal. Pupils are equal, round, and reactive to light.  Right eye exhibits no discharge. Left eye exhibits no discharge.  Neck: Normal range of motion. Neck supple. No JVD present. No thyromegaly present.  Cardiovascular: Normal rate, regular rhythm, normal heart sounds and intact distal pulses.  Exam reveals no gallop and no friction rub.   No murmur heard. Pulmonary/Chest: Effort normal and breath sounds normal. She has no wheezes. She has no rales.  Abdominal: Soft. Bowel sounds are normal. She exhibits no mass. There is no tenderness. There is no guarding.  Musculoskeletal: Normal range of motion. She exhibits no edema.  Lymphadenopathy:    She has no cervical adenopathy.  Neurological: She is alert. She has normal reflexes.  Skin: Skin is warm and dry. She is not diaphoretic.  Psychiatric: Mood and affect normal.      Assessment & Plan  Problem List Items Addressed This Visit    None    Visit Diagnoses    Labyrinthitis, unspecified laterality    -  Primary   Relevant Medications   meclizine (ANTIVERT) 25 MG tablet   azithromycin (ZITHROMAX) 250 MG tablet   Hyperglycemia       Relevant Orders   Hemoglobin A1c (Completed)   POCT CBG (Fasting - Glucose) (Completed)   Dehydration       hold hctz        Dr. Hayden Rasmusseneanna Maripat Borba [facility]  04/11/16

## 2016-04-11 LAB — HEMOGLOBIN A1C
Est. average glucose Bld gHb Est-mCnc: 154 mg/dL
Hgb A1c MFr Bld: 7 % — ABNORMAL HIGH (ref 4.8–5.6)

## 2016-04-11 LAB — POCT CBG (FASTING - GLUCOSE)-MANUAL ENTRY: Glucose Fasting, POC: 191 mg/dL — AB (ref 70–99)

## 2016-07-31 ENCOUNTER — Other Ambulatory Visit: Payer: Self-pay | Admitting: Family Medicine

## 2016-07-31 DIAGNOSIS — K219 Gastro-esophageal reflux disease without esophagitis: Secondary | ICD-10-CM

## 2016-07-31 DIAGNOSIS — E039 Hypothyroidism, unspecified: Secondary | ICD-10-CM

## 2016-07-31 DIAGNOSIS — F3341 Major depressive disorder, recurrent, in partial remission: Secondary | ICD-10-CM

## 2016-07-31 DIAGNOSIS — E785 Hyperlipidemia, unspecified: Secondary | ICD-10-CM

## 2016-08-15 ENCOUNTER — Other Ambulatory Visit: Payer: Self-pay

## 2016-09-24 ENCOUNTER — Encounter: Payer: Self-pay | Admitting: Family Medicine

## 2016-09-24 ENCOUNTER — Ambulatory Visit (INDEPENDENT_AMBULATORY_CARE_PROVIDER_SITE_OTHER): Payer: BLUE CROSS/BLUE SHIELD | Admitting: Family Medicine

## 2016-09-24 VITALS — BP 120/80 | HR 64 | Ht 63.0 in | Wt 176.0 lb

## 2016-09-24 DIAGNOSIS — Z026 Encounter for examination for insurance purposes: Secondary | ICD-10-CM

## 2016-09-24 DIAGNOSIS — R69 Illness, unspecified: Secondary | ICD-10-CM

## 2016-09-24 DIAGNOSIS — F3341 Major depressive disorder, recurrent, in partial remission: Secondary | ICD-10-CM

## 2016-09-24 DIAGNOSIS — E66811 Obesity, class 1: Secondary | ICD-10-CM

## 2016-09-24 DIAGNOSIS — J301 Allergic rhinitis due to pollen: Secondary | ICD-10-CM | POA: Diagnosis not present

## 2016-09-24 DIAGNOSIS — R5381 Other malaise: Secondary | ICD-10-CM

## 2016-09-24 DIAGNOSIS — E785 Hyperlipidemia, unspecified: Secondary | ICD-10-CM | POA: Diagnosis not present

## 2016-09-24 DIAGNOSIS — E039 Hypothyroidism, unspecified: Secondary | ICD-10-CM | POA: Diagnosis not present

## 2016-09-24 DIAGNOSIS — E860 Dehydration: Secondary | ICD-10-CM

## 2016-09-24 DIAGNOSIS — I10 Essential (primary) hypertension: Secondary | ICD-10-CM

## 2016-09-24 DIAGNOSIS — R7303 Prediabetes: Secondary | ICD-10-CM

## 2016-09-24 DIAGNOSIS — R5383 Other fatigue: Secondary | ICD-10-CM

## 2016-09-24 DIAGNOSIS — E669 Obesity, unspecified: Secondary | ICD-10-CM | POA: Diagnosis not present

## 2016-09-24 MED ORDER — CITALOPRAM HYDROBROMIDE 20 MG PO TABS: 20.0000 mg | ORAL_TABLET | Freq: Every day | ORAL | 3 refills | Status: DC

## 2016-09-24 MED ORDER — FLUTICASONE PROPIONATE 50 MCG/ACT NA SUSP: 2.0000 | Freq: Every day | NASAL | 6 refills | Status: DC

## 2016-09-24 NOTE — Progress Notes (Signed)
Name: Phyllis Wagner   MRN: [id number]    DOB: 04/10/1955   Date:09/24/2016       Progress Note  Subjective  Chief Complaint  Chief Complaint  Patient presents with  . Fatigue    out of thyroid med x 1 week- but feeling tired prior to this. Taking care of mom and working full time    Thyroid Problem  Presents for follow-up visit. Symptoms include anxiety, cold intolerance, depressed mood, fatigue, weight gain and weight loss. Patient reports no constipation, diaphoresis, diarrhea, dry skin, hair loss, heat intolerance, hoarse voice, menstrual problem, nail problem or palpitations. The symptoms have been worsening.  Depression       The patient presents with depression.  This is a chronic problem.  The onset quality is gradual.   The problem occurs intermittently.  The problem has been waxing and waning since onset.  Associated symptoms include fatigue, helplessness and sad.  Associated symptoms include no hopelessness, does not have insomnia, not irritable, no restlessness, no myalgias, no headaches, no indigestion and no suicidal ideas.  Past treatments include SSRIs - Selective serotonin reuptake inhibitors.  Compliance with treatment is good.  Past medical history includes thyroid problem and depression.     No problem-specific Assessment & Plan notes found for this encounter.   Past Medical History:  Diagnosis Date  . Depression   . Hyperlipidemia   . Thyroid disease     Past Surgical History:  Procedure Laterality Date  . COLONOSCOPY  2012   [facility]  . GASTRIC BYPASS    . KNEE SURGERY Left   . LYMPH NODE DISSECTION    . TONSILLECTOMY    . VAGINAL HYSTERECTOMY      Family History  Problem Relation Age of Onset  . Diabetes Maternal Grandmother     Social History   Social History  . Marital status: Married    Spouse name: N/A  . Number of children: N/A  . Years of education: N/A   Occupational History  . Not on file.   Social  History Main Topics  . Smoking status: Former Games developermoker  . Smokeless tobacco: Never Used  . Alcohol use 0.0 oz/week  . Drug use: No  . Sexual activity: Yes   Other Topics Concern  . Not on file   Social History Narrative  . No narrative on file    Allergies  Allergen Reactions  . Aspirin Other (See Comments)  . Biaxin [Clarithromycin]   . Penicillins Other (See Comments)  . Sulfa Antibiotics Hives    Outpatient Medications Prior to Visit  Medication Sig Dispense Refill  . cholecalciferol (VITAMIN D) 1000 UNITS tablet Take 1,000 Units by mouth daily.    Marland Kitchen. glucosamine-chondroitin 500-400 MG tablet Take 2 tablets by mouth daily.    . hydrochlorothiazide (HYDRODIURIL) 25 MG tablet Take 1 tablet (25 mg total) by mouth daily. 90 tablet 1  . levothyroxine (SYNTHROID, LEVOTHROID) 88 MCG tablet TAKE 1 TABLET BY MOUTH DAILY. GENERIC EQUIVALENT FOR SYNTHROID 30 tablet 0  . Omega-3 Fatty Acids (FISH OIL) 1000 MG CAPS Take 1 capsule by mouth daily.    . pantoprazole (PROTONIX) 40 MG tablet TAKE 1 TABLET BY MOUTH DAILY 90 tablet 0  . simvastatin (ZOCOR) 20 MG tablet TAKE 1 TABLET BY MOUTH DAILY 90 tablet 0  . vitamin B-12 (CYANOCOBALAMIN) 1000 MCG tablet Take 1,000 mcg by mouth daily.    . citalopram (CELEXA) 10 MG tablet TAKE 1 TABLET BY  MOUTH DAILY 90 tablet 0  . azithromycin (ZITHROMAX) 250 MG tablet 2 today then 1 a day for 4 days 6 tablet 0  . meclizine (ANTIVERT) 25 MG tablet Take 1 tablet (25 mg total) by mouth 3 (three) times daily as needed for dizziness. 30 tablet 0  . triamcinolone cream (KENALOG) 0.1 % Apply 1 application topically 2 (two) times daily. (Patient not taking: Reported on 02/16/2016) 30 g 0   No facility-administered medications prior to visit.     Review of Systems  Constitutional: Positive for fatigue, malaise/fatigue, weight gain and weight loss. Negative for chills, diaphoresis and fever.  HENT: Negative for ear discharge, ear pain, hoarse voice and sore throat.    Eyes: Negative for blurred vision.  Respiratory: Negative for cough, sputum production, shortness of breath and wheezing.   Cardiovascular: Negative for chest pain, palpitations and leg swelling.  Gastrointestinal: Negative for abdominal pain, blood in stool, constipation, diarrhea, heartburn, melena and nausea.  Genitourinary: Negative for dysuria, frequency, hematuria, menstrual problem and urgency.  Musculoskeletal: Negative for back pain, joint pain, myalgias and neck pain.  Skin: Negative for rash.  Neurological: Negative for dizziness, tingling, sensory change, focal weakness and headaches.  Endo/Heme/Allergies: Positive for cold intolerance. Negative for environmental allergies, heat intolerance and polydipsia. Does not bruise/bleed easily.  Psychiatric/Behavioral: Positive for depression. Negative for suicidal ideas. The patient is nervous/anxious. The patient does not have insomnia.      Objective  Vitals:   09/24/16 0800  BP: 120/80  Pulse: 64  Weight: 176 lb (79.8 kg)  Height: 5\' 3"  (1.6 m)    Physical Exam  Constitutional: She is well-developed, well-nourished, and in no distress. She is not irritable. No distress.  HENT:  Head: Normocephalic and atraumatic.  Right Ear: Tympanic membrane, external ear and ear canal normal.  Left Ear: Tympanic membrane, external ear and ear canal normal.  Nose: Nose normal.  Mouth/Throat: Oropharynx is clear and moist. Mucous membranes are not pale, dry and not cyanotic.  Eyes: Pupils are equal, round, and reactive to light. Conjunctivae, EOM and lids are normal. Right eye exhibits no discharge. Left eye exhibits no discharge.  Neck: Normal range of motion. Neck supple. No JVD present. No thyromegaly present.  Cardiovascular: Normal rate, regular rhythm, normal heart sounds and intact distal pulses.  Exam reveals no gallop and no friction rub.   No murmur heard. Pulmonary/Chest: Effort normal and breath sounds normal. She has no  wheezes. She has no rales.  Abdominal: Soft. Bowel sounds are normal. She exhibits no mass. There is no hepatosplenomegaly. There is no tenderness. There is no guarding.  Musculoskeletal: Normal range of motion. She exhibits no edema.  Lymphadenopathy:    She has no cervical adenopathy.  Neurological: She is alert. She has normal reflexes.  Skin: Skin is warm and dry. She is not diaphoretic.  Psychiatric: Mood and affect normal.  Nursing note and vitals reviewed.     Assessment & Plan  Problem List Items Addressed This Visit      Cardiovascular and Mediastinum   Essential hypertension   Relevant Orders   Renal Function Panel     Endocrine   Thyroid activity decreased   Relevant Orders   TSH     Other   Hyperlipidemia   Relevant Orders   Lipid Profile   Recurrent major depressive disorder, in partial remission (HCC)   Relevant Medications   citalopram (CELEXA) 20 MG tablet    Other Visit Diagnoses    Malaise and  fatigue    -  Primary   Relevant Medications   citalopram (CELEXA) 20 MG tablet   Taking medication for chronic disease       Relevant Orders   Hepatic Function Panel (6)   Prediabetes       Relevant Orders   Hemoglobin A1c   Encounter for insurance examination       Dehydration       Obesity (BMI 30.0-34.9)       Non-seasonal allergic rhinitis due to pollen          Meds ordered this encounter  Medications  . citalopram (CELEXA) 20 MG tablet    Sig: Take 1 tablet (20 mg total) by mouth daily.    Dispense:  30 tablet    Refill:  3  . fluticasone (FLONASE) 50 MCG/ACT nasal spray    Sig: Place 2 sprays into both nostrils daily.    Dispense:  16 g    Refill:  6   I spent 40 minutes with this patient, More than 50% of that time was spent in face to face education, counseling and care coordination.   Dr. [facility]  09/24/16

## 2016-09-25 ENCOUNTER — Other Ambulatory Visit: Payer: Self-pay

## 2016-09-25 DIAGNOSIS — E039 Hypothyroidism, unspecified: Secondary | ICD-10-CM

## 2016-09-25 LAB — HEPATIC FUNCTION PANEL (6)
ALT: 16 IU/L (ref 0–32)
AST: 16 IU/L (ref 0–40)
Alkaline Phosphatase: 63 IU/L (ref 39–117)
Bilirubin Total: 0.3 mg/dL (ref 0.0–1.2)
Bilirubin, Direct: 0.12 mg/dL (ref 0.00–0.40)

## 2016-09-25 LAB — LIPID PANEL
Chol/HDL Ratio: 2.2 ratio (ref 0.0–4.4)
Cholesterol, Total: 172 mg/dL (ref 100–199)
HDL: 78 mg/dL (ref >39)
LDL Calculated: 79 mg/dL (ref 0–99)
Triglycerides: 76 mg/dL (ref 0–149)
VLDL Cholesterol Cal: 15 mg/dL (ref 5–40)

## 2016-09-25 LAB — RENAL FUNCTION PANEL
Albumin: 4.4 g/dL (ref 3.5–5.5)
BUN/Creatinine Ratio: 15 (ref 9–23)
BUN: 10 mg/dL (ref 6–24)
CO2: 26 mmol/L (ref 20–29)
Calcium: 9.2 mg/dL (ref 8.7–10.2)
Chloride: 98 mmol/L (ref 96–106)
Creatinine, Ser: 0.67 mg/dL (ref 0.57–1.00)
GFR calc Af Amer: 112 mL/min/{1.73_m2} (ref >59)
GFR calc non Af Amer: 97 mL/min/{1.73_m2} (ref >59)
Glucose: 140 mg/dL — ABNORMAL HIGH (ref 65–99)
Phosphorus: 3.3 mg/dL (ref 2.5–4.5)
Potassium: 4.2 mmol/L (ref 3.5–5.2)
Sodium: 142 mmol/L (ref 134–144)

## 2016-09-25 LAB — TSH: TSH: 2.97 u[IU]/mL (ref 0.450–4.500)

## 2016-09-25 LAB — HEMOGLOBIN A1C
Est. average glucose Bld gHb Est-mCnc: 169 mg/dL
Hgb A1c MFr Bld: 7.5 % — ABNORMAL HIGH (ref 4.8–5.6)

## 2016-09-25 MED ORDER — GLIMEPIRIDE 1 MG PO TABS: 1.0000 mg | ORAL_TABLET | Freq: Every day | ORAL | 1 refills | Status: DC

## 2016-09-25 MED ORDER — LEVOTHYROXINE SODIUM 88 MCG PO TABS: 88.0000 ug | ORAL_TABLET | Freq: Every day | ORAL | 0 refills | Status: DC

## 2016-09-25 MED ORDER — LEVOTHYROXINE SODIUM 88 MCG PO TABS: 88.0000 ug | ORAL_TABLET | Freq: Every day | ORAL | 1 refills | Status: DC

## 2016-10-02 ENCOUNTER — Telehealth: Payer: Self-pay

## 2016-10-02 ENCOUNTER — Other Ambulatory Visit: Payer: Self-pay

## 2016-10-02 MED ORDER — AZITHROMYCIN 250 MG PO TABS: ORAL_TABLET | ORAL | 0 refills | Status: DC

## 2016-10-02 NOTE — Telephone Encounter (Signed)
Pt called wanting a "Zpack called in because has a sinus infection and mom died yesterday so doesn't want to come in if doesn't have to"- [place] [facility] [place]

## 2016-10-11 ENCOUNTER — Other Ambulatory Visit: Payer: Self-pay

## 2016-10-11 DIAGNOSIS — R5383 Other fatigue: Principal | ICD-10-CM

## 2016-10-11 DIAGNOSIS — F3341 Major depressive disorder, recurrent, in partial remission: Secondary | ICD-10-CM

## 2016-10-11 DIAGNOSIS — R5381 Other malaise: Secondary | ICD-10-CM

## 2016-10-11 MED ORDER — CITALOPRAM HYDROBROMIDE 20 MG PO TABS: 20.0000 mg | ORAL_TABLET | Freq: Every day | ORAL | 1 refills | Status: DC

## 2016-10-22 ENCOUNTER — Telehealth: Payer: Self-pay | Admitting: Family Medicine

## 2016-10-22 ENCOUNTER — Other Ambulatory Visit: Payer: Self-pay | Admitting: Family Medicine

## 2016-10-22 ENCOUNTER — Other Ambulatory Visit: Payer: Self-pay

## 2016-10-22 DIAGNOSIS — E039 Hypothyroidism, unspecified: Secondary | ICD-10-CM

## 2016-10-22 DIAGNOSIS — E785 Hyperlipidemia, unspecified: Secondary | ICD-10-CM

## 2016-10-22 DIAGNOSIS — K219 Gastro-esophageal reflux disease without esophagitis: Secondary | ICD-10-CM

## 2016-10-22 MED ORDER — LEVOTHYROXINE SODIUM 88 MCG PO TABS: 88.0000 ug | ORAL_TABLET | Freq: Every day | ORAL | 0 refills | Status: DC

## 2016-10-22 NOTE — Telephone Encounter (Signed)
The first time I received a request on these meds were this am- I've sent them [facility] and have notified the pt that it was not received until today and I have sent the 3 in

## 2016-10-22 NOTE — Telephone Encounter (Signed)
Patient has requested these refills through mail order. Patient states that the mail order pharmacy is still waiting for confirmation. Patient is currently out of these meds- simvastatin (ZOCOR) 20 MG tablet  pantoprazole (PROTONIX) 40 MG  levothyroxine (SYNTHROID, LEVOTHROID) 88 MCG tablet  [facility] [place] [id number] [employer] [address]

## 2017-01-28 ENCOUNTER — Other Ambulatory Visit: Payer: Self-pay | Admitting: Family Medicine

## 2017-01-28 DIAGNOSIS — H2513 Age-related nuclear cataract, bilateral: Secondary | ICD-10-CM | POA: Diagnosis not present

## 2017-02-15 ENCOUNTER — Other Ambulatory Visit: Payer: Self-pay | Admitting: Family Medicine

## 2017-02-15 DIAGNOSIS — E785 Hyperlipidemia, unspecified: Secondary | ICD-10-CM

## 2017-02-15 DIAGNOSIS — I10 Essential (primary) hypertension: Secondary | ICD-10-CM

## 2017-02-15 DIAGNOSIS — E039 Hypothyroidism, unspecified: Secondary | ICD-10-CM

## 2017-02-15 DIAGNOSIS — K219 Gastro-esophageal reflux disease without esophagitis: Secondary | ICD-10-CM

## 2017-02-20 DIAGNOSIS — H2511 Age-related nuclear cataract, right eye: Secondary | ICD-10-CM | POA: Diagnosis not present

## 2017-02-27 DIAGNOSIS — H2512 Age-related nuclear cataract, left eye: Secondary | ICD-10-CM | POA: Diagnosis not present

## 2017-03-04 ENCOUNTER — Other Ambulatory Visit: Payer: Self-pay | Admitting: Family Medicine

## 2017-03-30 ENCOUNTER — Other Ambulatory Visit: Payer: Self-pay | Admitting: Family Medicine

## 2017-06-03 ENCOUNTER — Other Ambulatory Visit: Payer: Self-pay

## 2017-06-03 DIAGNOSIS — I10 Essential (primary) hypertension: Secondary | ICD-10-CM

## 2017-06-03 DIAGNOSIS — R5381 Other malaise: Secondary | ICD-10-CM

## 2017-06-03 DIAGNOSIS — R5383 Other fatigue: Principal | ICD-10-CM

## 2017-06-03 DIAGNOSIS — E785 Hyperlipidemia, unspecified: Secondary | ICD-10-CM

## 2017-06-03 DIAGNOSIS — K219 Gastro-esophageal reflux disease without esophagitis: Secondary | ICD-10-CM

## 2017-06-03 DIAGNOSIS — E039 Hypothyroidism, unspecified: Secondary | ICD-10-CM

## 2017-06-03 DIAGNOSIS — F3341 Major depressive disorder, recurrent, in partial remission: Secondary | ICD-10-CM

## 2017-06-03 MED ORDER — LEVOTHYROXINE SODIUM 88 MCG PO TABS: 88.0000 ug | ORAL_TABLET | Freq: Every day | ORAL | 0 refills | Status: DC

## 2017-06-03 MED ORDER — SIMVASTATIN 20 MG PO TABS: 20.0000 mg | ORAL_TABLET | Freq: Every day | ORAL | 0 refills | Status: DC

## 2017-06-03 MED ORDER — HYDROCHLOROTHIAZIDE 25 MG PO TABS: 25.0000 mg | ORAL_TABLET | Freq: Every day | ORAL | 0 refills | Status: DC

## 2017-06-03 MED ORDER — PANTOPRAZOLE SODIUM 40 MG PO TBEC: 40.0000 mg | DELAYED_RELEASE_TABLET | Freq: Every day | ORAL | 0 refills | Status: DC

## 2017-06-03 MED ORDER — CITALOPRAM HYDROBROMIDE 20 MG PO TABS: 20.0000 mg | ORAL_TABLET | Freq: Every day | ORAL | 0 refills | Status: DC

## 2017-06-06 ENCOUNTER — Encounter: Payer: Self-pay | Admitting: Family Medicine

## 2017-06-06 ENCOUNTER — Ambulatory Visit (INDEPENDENT_AMBULATORY_CARE_PROVIDER_SITE_OTHER): Payer: BLUE CROSS/BLUE SHIELD | Admitting: Family Medicine

## 2017-06-06 VITALS — BP 120/80 | HR 72 | Ht 63.0 in | Wt 176.0 lb

## 2017-06-06 DIAGNOSIS — R1084 Generalized abdominal pain: Secondary | ICD-10-CM | POA: Diagnosis not present

## 2017-06-06 DIAGNOSIS — K2100 Gastro-esophageal reflux disease with esophagitis, without bleeding: Secondary | ICD-10-CM

## 2017-06-06 DIAGNOSIS — R7303 Prediabetes: Secondary | ICD-10-CM | POA: Diagnosis not present

## 2017-06-06 DIAGNOSIS — Z1211 Encounter for screening for malignant neoplasm of colon: Secondary | ICD-10-CM | POA: Diagnosis not present

## 2017-06-06 DIAGNOSIS — Z0289 Encounter for other administrative examinations: Secondary | ICD-10-CM | POA: Diagnosis not present

## 2017-06-06 NOTE — Progress Notes (Signed)
Name: Phyllis Wagner   MRN: [id number]    DOB: [birth year]   Date:06/06/2017       Progress Note  Subjective  Chief Complaint  Chief Complaint  Patient presents with  . Abdominal Pain    starts in lower abdomen and moves up to center of chest. Burning sensation, eating TUMS and taking protonix- doesn't help. Spells can happen at any time, Tramadol helps-     Patient provides paperwork for employment physical and associated lab work.  Abdominal Pain  This is a new (reflux for ever/"I know it's not my heart") problem. The onset quality is gradual. The problem occurs intermittently (3 months). The problem has been gradually improving. The pain is located in the generalized abdominal region. The pain is at a severity of 10/10. The pain is severe (duration 30-60 minutes). The quality of the pain is aching. Pain radiation: sternal /described as "burning" Pertinent negatives include no anorexia, arthralgias, belching, constipation, diarrhea, dysuria, fever, flatus, frequency, headaches, hematochezia, hematuria, melena, myalgias, nausea, vomiting or weight loss. Nothing aggravates the pain. The pain is relieved by nothing. She has tried antacids and oral narcotic analgesics for the symptoms. The treatment provided mild relief. There is no history of abdominal surgery, colon cancer, Crohn's disease, gallstones, GERD, irritable bowel syndrome, pancreatitis, PUD or ulcerative colitis.    No problem-specific Assessment & Plan notes found for this encounter.   Past Medical History:  Diagnosis Date  . Depression   . Hyperlipidemia   . Thyroid disease     Past Surgical History:  Procedure Laterality Date  . COLONOSCOPY  2012   [facility]  . GASTRIC BYPASS    . KNEE SURGERY Left   . LYMPH NODE DISSECTION    . TONSILLECTOMY    . VAGINAL HYSTERECTOMY      Family History  Problem Relation Age of Onset  . Diabetes Maternal Grandmother     Social History    Socioeconomic History  . Marital status: Married    Spouse name: Not on file  . Number of children: Not on file  . Years of education: Not on file  . Highest education level: Not on file  Occupational History  . Not on file  Social Needs  . Financial resource strain: Not on file  . Food insecurity:    Worry: Not on file    Inability: Not on file  . Transportation needs:    Medical: Not on file    Non-medical: Not on file  Tobacco Use  . Smoking status: Former Games developermoker  . Smokeless tobacco: Never Used  Substance and Sexual Activity  . Alcohol use: Yes    Alcohol/week: 0.0 oz  . Drug use: No  . Sexual activity: Yes  Lifestyle  . Physical activity:    Days per week: Not on file    Minutes per session: Not on file  . Stress: Not on file  Relationships  . Social connections:    Talks on phone: Not on file    Gets together: Not on file    Attends religious service: Not on file    Active member of club or organization: Not on file    Attends meetings of clubs or organizations: Not on file    Relationship status: Not on file  . Intimate partner violence:    Fear of current or ex partner: Not on file    Emotionally abused: Not on file    Physically abused: Not on file  Forced sexual activity: Not on file  Other Topics Concern  . Not on file  Social History Narrative  . Not on file    Allergies  Allergen Reactions  . Aspirin Other (See Comments)  . Biaxin [Clarithromycin]   . Penicillins Other (See Comments)  . Sulfa Antibiotics Hives    Outpatient Medications Prior to Visit  Medication Sig Dispense Refill  . cholecalciferol (VITAMIN D) 1000 UNITS tablet Take 1,000 Units by mouth daily.    . citalopram (CELEXA) 20 MG tablet Take 1 tablet (20 mg total) by mouth daily. 30 tablet 0  . fluticasone (FLONASE) 50 MCG/ACT nasal spray Place 2 sprays into both nostrils daily. 16 g 6  . glucosamine-chondroitin 500-400 MG tablet Take 2 tablets by mouth daily.    .  hydrochlorothiazide (HYDRODIURIL) 25 MG tablet Take 1 tablet (25 mg total) by mouth daily. 30 tablet 0  . levothyroxine (SYNTHROID, LEVOTHROID) 88 MCG tablet Take 1 tablet (88 mcg total) by mouth daily before breakfast. 30 tablet 0  . Omega-3 Fatty Acids (FISH OIL) 1000 MG CAPS Take 1 capsule by mouth daily.    . pantoprazole (PROTONIX) 40 MG tablet Take 1 tablet (40 mg total) by mouth daily. 30 tablet 0  . simvastatin (ZOCOR) 20 MG tablet Take 1 tablet (20 mg total) by mouth daily. 30 tablet 0  . vitamin B-12 (CYANOCOBALAMIN) 1000 MCG tablet Take 1,000 mcg by mouth daily.    Marland Kitchen glimepiride (AMARYL) 1 MG tablet TAKE 1 TABLET (1 MG TOTAL) BY MOUTH DAILY WITH BREAKFAST. (NEED APPT FOR FOLLOW UP AND LABS) (Patient not taking: Reported on 06/06/2017) 30 tablet 0  . azithromycin (ZITHROMAX) 250 MG tablet Use as directed- pt says she has taken with no issues 6 tablet 0  . glimepiride (AMARYL) 1 MG tablet TAKE 1 TABLET (1 MG TOTAL) BY MOUTH DAILY WITH BREAKFAST. (NEED APPT FOR FOLLOW UP AND LABS) 30 tablet 0   No facility-administered medications prior to visit.     Review of Systems  Constitutional: Negative for chills, fever, malaise/fatigue and weight loss.  HENT: Negative for ear discharge, ear pain and sore throat.   Eyes: Negative for blurred vision.  Respiratory: Negative for cough, sputum production, shortness of breath and wheezing.   Cardiovascular: Negative for chest pain, palpitations and leg swelling.  Gastrointestinal: Positive for abdominal pain. Negative for anorexia, blood in stool, constipation, diarrhea, flatus, heartburn, hematochezia, melena, nausea and vomiting.  Genitourinary: Negative for dysuria, frequency, hematuria and urgency.  Musculoskeletal: Negative for arthralgias, back pain, joint pain, myalgias and neck pain.  Skin: Negative for rash.  Neurological: Negative for dizziness, tingling, sensory change, focal weakness and headaches.  Endo/Heme/Allergies: Negative for  environmental allergies and polydipsia. Does not bruise/bleed easily.  Psychiatric/Behavioral: Negative for depression and suicidal ideas. The patient is not nervous/anxious and does not have insomnia.      Objective  Vitals:   06/06/17 0842  BP: 120/80  Pulse: 72  Weight: 176 lb (79.8 kg)  Height: 5\' 3"  (1.6 m)    Physical Exam  Constitutional: She is well-developed, well-nourished, and in no distress. No distress.  HENT:  Head: Normocephalic and atraumatic.  Right Ear: External ear normal.  Left Ear: External ear normal.  Nose: Nose normal.  Mouth/Throat: Oropharynx is clear and moist.  Eyes: Pupils are equal, round, and reactive to light. Conjunctivae and EOM are normal. Right eye exhibits no discharge. Left eye exhibits no discharge.  Neck: Normal range of motion. Neck supple. No JVD present.  No thyromegaly present.  Cardiovascular: Normal rate, regular rhythm, normal heart sounds and intact distal pulses. Exam reveals no gallop and no friction rub.  No murmur heard. Pulmonary/Chest: Effort normal and breath sounds normal. She has no wheezes. She has no rales.  Abdominal: Soft. Bowel sounds are normal. She exhibits no mass. There is no hepatosplenomegaly. There is tenderness in the epigastric area. There is no rigidity, no rebound, no guarding and no CVA tenderness.  Genitourinary: Rectum normal. Rectal exam shows no external hemorrhoid, no mass, no tenderness and guaiac negative stool.  Musculoskeletal: Normal range of motion. She exhibits no edema.  Lymphadenopathy:    She has no cervical adenopathy.  Neurological: She is alert. She has normal reflexes.  Skin: Skin is warm and dry. She is not diaphoretic.  Psychiatric: Mood and affect normal.  Nursing note and vitals reviewed.     Assessment & Plan  Problem List Items Addressed This Visit    None    Visit Diagnoses    Physical examination of employee    -  Primary   Generalized abdominal pain       Relevant  Orders   US Abdomen Complete   US Pelvis Complete   H. pylori antibody, IgG   Lipase   Hepatic Function Panel (6)   Ambulatory referral to Gastroenterology   Reflux esophagitis       Relevant Orders   Ambulatory referral to Gastroenterology   Colon cancer screening       Prediabetes       Relevant Orders   Hemoglobin A1c      No orders of the defined types were placed in this encounter.     Dr. [facility]  06/06/17

## 2017-06-07 LAB — HEPATIC FUNCTION PANEL (6)
ALT: 14 IU/L (ref 0–32)
AST: 16 IU/L (ref 0–40)
Albumin: 4.7 g/dL (ref 3.5–5.5)
Alkaline Phosphatase: 77 IU/L (ref 39–117)
Bilirubin Total: 0.4 mg/dL (ref 0.0–1.2)
Bilirubin, Direct: 0.12 mg/dL (ref 0.00–0.40)

## 2017-06-07 LAB — LIPID PANEL WITH LDL/HDL RATIO
Cholesterol, Total: 179 mg/dL (ref 100–199)
HDL: 79 mg/dL (ref >39)
LDL Calculated: 85 mg/dL (ref 0–99)
LDl/HDL Ratio: 1.1 ratio (ref 0.0–3.2)
Triglycerides: 73 mg/dL (ref 0–149)
VLDL Cholesterol Cal: 15 mg/dL (ref 5–40)

## 2017-06-07 LAB — GLUCOSE, RANDOM: Glucose: 141 mg/dL — ABNORMAL HIGH (ref 65–99)

## 2017-06-07 LAB — LIPASE: Lipase: 28 U/L (ref 14–72)

## 2017-06-07 LAB — HEMOGLOBIN A1C
Est. average glucose Bld gHb Est-mCnc: 160 mg/dL
Hgb A1c MFr Bld: 7.2 % — ABNORMAL HIGH (ref 4.8–5.6)

## 2017-06-07 LAB — H. PYLORI ANTIBODY, IGG: H. pylori, IgG AbS: <0.8 Index Value (ref 0.00–0.79)

## 2017-06-10 ENCOUNTER — Ambulatory Visit (INDEPENDENT_AMBULATORY_CARE_PROVIDER_SITE_OTHER): Payer: BLUE CROSS/BLUE SHIELD | Admitting: Gastroenterology

## 2017-06-10 ENCOUNTER — Encounter: Payer: Self-pay | Admitting: Gastroenterology

## 2017-06-10 VITALS — BP 125/83 | HR 61 | Ht 63.0 in | Wt 183.2 lb

## 2017-06-10 DIAGNOSIS — R109 Unspecified abdominal pain: Secondary | ICD-10-CM

## 2017-06-10 MED ORDER — SUCRALFATE 1 GM/10ML PO SUSP: 1.0000 g | Freq: Four times a day (QID) | ORAL | 1 refills | Status: DC

## 2017-06-10 NOTE — Progress Notes (Signed)
Wyline Mood MD, MRCP(U.K) [address]  [address]  [place], [place] [address]  Main: [phone]  Fax: [phone]   Gastroenterology Consultation  Referring Provider:     Duanne Limerick, MD Primary Care Physician:  Duanne Limerick, MD Primary Gastroenterologist:  Dr. Wyline Mood  Reason for Consultation:     Abdominal pain         HPI:   Phyllis Wagner is a 62 y.o. y/o female referred for consultation & management  by Dr. Duanne Limerick, MD.    She has been referred for abdominal pain .   She has had reflux for many years .   Abdominal pain: Onset: 3 months, getting worse, no pattern , few times a week , each episode lasts 45 mins to 60 mins  Site :lower abdomen ,moves to her chest ,  Radiation: chest  Nature of pain: burning in nature " like burning bricks"  Aggravating factors: nothing  Relieving factors : tramadol helps a bit  Weight loss: no , no wieght gain  NSAID use: none  PPI use :Prilosec once a day, morning with coffee  Gall bladder surgery: intact  Frequency of bowel movements: every day - soft  Change in bowel movements: no  Relief with bowel movements: no  Gas/Bloating/Abdominal distension: yes -unrelated to the pain .    Colonoscopy 2011 - no polyps,no family history of colon cancer or polyps.  She has had a gastric bypass 6 years back "resection ", hysterectomy too  She was told she had a small hernia previously   Past Medical History:  Diagnosis Date  . Depression   . Hyperlipidemia   . Thyroid disease     Past Surgical History:  Procedure Laterality Date  . COLONOSCOPY  2012   cleared for 10 yrs- [employer] [facility]  . GASTRIC BYPASS    . KNEE SURGERY Left   . LYMPH NODE DISSECTION    . TONSILLECTOMY    . VAGINAL HYSTERECTOMY      Prior to Admission medications   Medication Sig Start Date End Date Taking? Authorizing Provider  cholecalciferol (VITAMIN D) 1000 UNITS tablet Take 1,000 Units by mouth daily.   Yes [provider]  citalopram (CELEXA) 20 MG tablet Take 1 tablet (20 mg total) by mouth daily. 06/03/17  Yes Duanne Limerick, MD  glucosamine-chondroitin 500-400 MG tablet Take 2 tablets by mouth daily.   Yes [provider]  hydrochlorothiazide (HYDRODIURIL) 25 MG tablet Take 1 tablet (25 mg total) by mouth daily. 06/03/17  Yes Duanne Limerick, MD  levothyroxine (SYNTHROID, LEVOTHROID) 88 MCG tablet Take 1 tablet (88 mcg total) by mouth daily before breakfast. 06/03/17  Yes Duanne Limerick, MD  Omega-3 Fatty Acids (FISH OIL) 1000 MG CAPS Take 1 capsule by mouth daily.   Yes [provider]  pantoprazole (PROTONIX) 40 MG tablet Take 1 tablet (40 mg total) by mouth daily. 06/03/17  Yes Duanne Limerick, MD  simvastatin (ZOCOR) 20 MG tablet Take 1 tablet (20 mg total) by mouth daily. 06/03/17  Yes Duanne Limerick, MD  vitamin B-12 (CYANOCOBALAMIN) 1000 MCG tablet Take 1,000 mcg by mouth daily.   Yes [provider]  fluticasone (FLONASE) 50 MCG/ACT nasal spray Place 2 sprays into both nostrils daily. Patient not taking: Reported on 06/10/2017 09/24/16   Duanne Limerick, MD  glimepiride (AMARYL) 1 MG tablet TAKE 1 TABLET (1 MG TOTAL) BY MOUTH DAILY WITH BREAKFAST. (NEED APPT FOR FOLLOW  UP AND LABS) Patient not taking: Reported on 06/10/2017 03/04/17   Duanne LimerickJones, Deanna C, MD    Family History  Problem Relation Age of Onset  . Diabetes Maternal Grandmother      Social History   Tobacco Use  . Smoking status: Former Games developermoker  . Smokeless tobacco: Never Used  Substance Use Topics  . Alcohol use: Yes    Alcohol/week: 0.0 oz  . Drug use: No    Allergies as of 06/10/2017 - Review Complete 06/10/2017  Allergen Reaction Noted  . Nsaids Hives and Swelling 04/15/2015  . Aspirin Other (See Comments) 01/05/2015  . Biaxin [clarithromycin]  01/18/2015  . Penicillins Other (See Comments) 01/05/2015  . Sulfa antibiotics Hives 01/04/2016    Review of Systems:    All systems reviewed and  negative except where noted in HPI.   Physical Exam:  BP 125/83 (BP Location: Right Arm, Patient Position: Sitting, Cuff Size: Normal)   Pulse 61   Ht 5\' 3"  (1.6 m)   Wt 183 lb 3.2 oz (83.1 kg)   BMI 32.45 kg/m  No LMP recorded. Patient has had a hysterectomy. Psych:  Alert and cooperative. Normal mood and affect. General:   Alert,  Well-developed, well-nourished, pleasant and cooperative in NAD Head:  Normocephalic and atraumatic. Eyes:  Sclera clear, no icterus.   Conjunctiva pink. Ears:  Normal auditory acuity. Nose:  No deformity, discharge, or lesions. Mouth:  No deformity or lesions,oropharynx pink & moist. Neck:  Supple; no masses or thyromegaly. Lungs:  Respirations even and unlabored.  Clear throughout to auscultation.   No wheezes, crackles, or rhonchi. No acute distress. Heart:  Regular rate and rhythm; no murmurs, clicks, rubs, or gallops. Abdomen:  Normal bowel sounds.  No bruits.  Soft, non-tender and non-distended without masses, hepatosplenomegaly or hernias noted.  No guarding or rebound tenderness.    Extremities:  No clubbing or edema.  No cyanosis. Neurologic:  Alert and oriented x3;  grossly normal neurologically. Skin:  Intact without significant lesions or rashes. No jaundice. Lymph Nodes:  No significant cervical adenopathy. Psych:  Alert and cooperative. Normal mood and affect.  Imaging Studies: No results found.  Assessment and Plan:   Phyllis Wagner is a 62 y.o. y/o female has been referred for  abdominal pain . Ashby Dawesature of the pain is not biliary in nature, has some features of GERD   Plan  1. Continue PPI, increase to 40 mg BID prilosec 2. EGD+colonoscopy  3. Carafate QID as needed  4. If above negative will get CT abdomen   I have discussed alternative options, risks & benefits,  which include, but are not limited to, bleeding, infection, perforation,respiratory complication & drug reaction.  The patient agrees with this plan & written  consent will be obtained.    Follow up in 3 months   Dr Wyline MoodKiran Yoshiko Keleher MD,MRCP(U.K)

## 2017-06-11 ENCOUNTER — Encounter: Payer: Self-pay | Admitting: Family Medicine

## 2017-06-11 ENCOUNTER — Ambulatory Visit (INDEPENDENT_AMBULATORY_CARE_PROVIDER_SITE_OTHER): Payer: BLUE CROSS/BLUE SHIELD | Admitting: Family Medicine

## 2017-06-11 ENCOUNTER — Other Ambulatory Visit: Payer: Self-pay

## 2017-06-11 ENCOUNTER — Ambulatory Visit
Admission: RE | Admit: 2017-06-11 | Discharge: 2017-06-11 | Disposition: A | Discharge status: Home / Self Care | Payer: BLUE CROSS/BLUE SHIELD | Source: Ambulatory Visit | Attending: Family Medicine | Admitting: Family Medicine

## 2017-06-11 ENCOUNTER — Ambulatory Visit: Payer: Self-pay

## 2017-06-11 DIAGNOSIS — R109 Unspecified abdominal pain: Secondary | ICD-10-CM

## 2017-06-11 DIAGNOSIS — Z90710 Acquired absence of both cervix and uterus: Secondary | ICD-10-CM | POA: Diagnosis not present

## 2017-06-11 DIAGNOSIS — R1084 Generalized abdominal pain: Secondary | ICD-10-CM | POA: Insufficient documentation

## 2017-06-11 DIAGNOSIS — R932 Abnormal findings on diagnostic imaging of liver and biliary tract: Secondary | ICD-10-CM | POA: Diagnosis not present

## 2017-06-11 DIAGNOSIS — K8050 Calculus of bile duct without cholangitis or cholecystitis without obstruction: Secondary | ICD-10-CM

## 2017-06-11 DIAGNOSIS — R1030 Lower abdominal pain, unspecified: Secondary | ICD-10-CM | POA: Diagnosis not present

## 2017-06-11 NOTE — Progress Notes (Signed)
Name: Phyllis Wagner   MRN: [id number]    DOB: 1955/08/02   Date:06/11/2017       Progress Note  Subjective  Chief Complaint  Chief Complaint  Patient presents with  . Follow-up    abd pain- scheduled appt with Dr Lemar Livings tomorrow    Abdominal Pain  This is a recurrent problem. The current episode started more than 1 month ago (overall 2 years/ increased intensity for last 4 - months). The onset quality is gradual. The problem occurs every several days. The problem has been waxing and waning. The pain is at a severity of 5/10. The quality of the pain is colicky. The abdominal pain radiates to the back. Associated symptoms include nausea. Pertinent negatives include no anorexia, arthralgias, belching, constipation, diarrhea, dysuria, fever, flatus, frequency, headaches, hematochezia, hematuria, melena, myalgias, vomiting or weight loss. Nothing aggravates the pain. The pain is relieved by nothing. She has tried proton pump inhibitors for the symptoms. The treatment provided moderate relief. Prior diagnostic workup includes ultrasound. Her past medical history is significant for gallstones. There is no history of abdominal surgery, colon cancer, Crohn's disease, GERD, irritable bowel syndrome, pancreatitis, PUD or ulcerative colitis.    No problem-specific Assessment & Plan notes found for this encounter.   Past Medical History:  Diagnosis Date  . Depression   . Hyperlipidemia   . Thyroid disease     Past Surgical History:  Procedure Laterality Date  . COLONOSCOPY  2012   cleared for 10 yrs- [employer]  . GASTRIC BYPASS    . KNEE SURGERY Left   . LYMPH NODE DISSECTION    . TONSILLECTOMY    . VAGINAL HYSTERECTOMY      Family History  Problem Relation Age of Onset  . Diabetes Maternal Grandmother     Social History   Socioeconomic History  . Marital status: Married    Spouse name: Not on file  . Number of children: Not on file  . Years of education: Not on file   . Highest education level: Not on file  Occupational History  . Not on file  Social Needs  . Financial resource strain: Not on file  . Food insecurity:    Worry: Not on file    Inability: Not on file  . Transportation needs:    Medical: Not on file    Non-medical: Not on file  Tobacco Use  . Smoking status: Former Games developer  . Smokeless tobacco: Never Used  Substance and Sexual Activity  . Alcohol use: Yes    Alcohol/week: 0.0 oz  . Drug use: No  . Sexual activity: Yes  Lifestyle  . Physical activity:    Days per week: Not on file    Minutes per session: Not on file  . Stress: Not on file  Relationships  . Social connections:    Talks on phone: Not on file    Gets together: Not on file    Attends religious service: Not on file    Active member of club or organization: Not on file    Attends meetings of clubs or organizations: Not on file    Relationship status: Not on file  . Intimate partner violence:    Fear of current or ex partner: Not on file    Emotionally abused: Not on file    Physically abused: Not on file    Forced sexual activity: Not on file  Other Topics Concern  . Not on file  Social History Narrative  .  Not on file    Allergies  Allergen Reactions  . Nsaids Hives and Swelling  . Aspirin Other (See Comments)  . Biaxin [Clarithromycin]   . Penicillins Other (See Comments)  . Sulfa Antibiotics Hives    Outpatient Medications Prior to Visit  Medication Sig Dispense Refill  . cholecalciferol (VITAMIN D) 1000 UNITS tablet Take 1,000 Units by mouth daily.    . citalopram (CELEXA) 20 MG tablet Take 1 tablet (20 mg total) by mouth daily. 30 tablet 0  . fluticasone (FLONASE) 50 MCG/ACT nasal spray Place 2 sprays into both nostrils daily. (Patient not taking: Reported on 06/10/2017) 16 g 6  . glimepiride (AMARYL) 1 MG tablet TAKE 1 TABLET (1 MG TOTAL) BY MOUTH DAILY WITH BREAKFAST. (NEED APPT FOR FOLLOW UP AND LABS) (Patient not taking: Reported on 06/10/2017)  30 tablet 0  . glucosamine-chondroitin 500-400 MG tablet Take 2 tablets by mouth daily.    . hydrochlorothiazide (HYDRODIURIL) 25 MG tablet Take 1 tablet (25 mg total) by mouth daily. 30 tablet 0  . levothyroxine (SYNTHROID, LEVOTHROID) 88 MCG tablet Take 1 tablet (88 mcg total) by mouth daily before breakfast. 30 tablet 0  . Omega-3 Fatty Acids (FISH OIL) 1000 MG CAPS Take 1 capsule by mouth daily.    . pantoprazole (PROTONIX) 40 MG tablet Take 1 tablet (40 mg total) by mouth daily. 30 tablet 0  . simvastatin (ZOCOR) 20 MG tablet Take 1 tablet (20 mg total) by mouth daily. 30 tablet 0  . sucralfate (CARAFATE) 1 GM/10ML suspension Take 10 mLs (1 g total) by mouth 4 (four) times daily. 420 mL 1  . vitamin B-12 (CYANOCOBALAMIN) 1000 MCG tablet Take 1,000 mcg by mouth daily.     No facility-administered medications prior to visit.     Review of Systems  Constitutional: Negative for chills, fever, malaise/fatigue and weight loss.  HENT: Negative for ear discharge, ear pain and sore throat.   Eyes: Negative for blurred vision.  Respiratory: Negative for cough, sputum production, shortness of breath and wheezing.   Cardiovascular: Negative for chest pain, palpitations and leg swelling.  Gastrointestinal: Positive for abdominal pain and nausea. Negative for anorexia, blood in stool, constipation, diarrhea, flatus, heartburn, hematochezia, melena and vomiting.  Genitourinary: Negative for dysuria, frequency, hematuria and urgency.  Musculoskeletal: Negative for arthralgias, back pain, joint pain, myalgias and neck pain.  Skin: Negative for rash.  Neurological: Negative for dizziness, tingling, sensory change, focal weakness and headaches.  Endo/Heme/Allergies: Negative for environmental allergies and polydipsia. Does not bruise/bleed easily.  Psychiatric/Behavioral: Negative for depression and suicidal ideas. The patient is not nervous/anxious and does not have insomnia.      Objective  There  were no vitals filed for this visit.  Physical Exam  Constitutional: She is well-developed, well-nourished, and in no distress. No distress.  HENT:  Head: Normocephalic and atraumatic.  Right Ear: External ear normal.  Left Ear: External ear normal.  Nose: Nose normal.  Mouth/Throat: Oropharynx is clear and moist.  Eyes: Pupils are equal, round, and reactive to light. Conjunctivae and EOM are normal. Right eye exhibits no discharge. Left eye exhibits no discharge.  Neck: Normal range of motion. Neck supple. No JVD present. No thyromegaly present.  Cardiovascular: Normal rate, regular rhythm, normal heart sounds and intact distal pulses. Exam reveals no gallop and no friction rub.  No murmur heard. Pulmonary/Chest: Effort normal and breath sounds normal. She has no wheezes. She has no rales.  Abdominal: Soft. Bowel sounds are normal. She exhibits  no mass. There is no hepatosplenomegaly. There is tenderness in the right upper quadrant and epigastric area. There is no rigidity, no rebound, no guarding and no CVA tenderness.  Musculoskeletal: Normal range of motion. She exhibits no edema.  Lymphadenopathy:    She has no cervical adenopathy.  Neurological: She is alert. She has normal reflexes.  Skin: Skin is warm and dry. She is not diaphoretic.  Psychiatric: Mood and affect normal.  Nursing note and vitals reviewed.     Assessment & Plan  Problem List Items Addressed This Visit    None    Visit Diagnoses    Biliary colic    -  Primary   proceed with referral to Dr Birdie Sons      No orders of the defined types were placed in this encounter.     Dr. [facility]  06/11/17

## 2017-06-12 ENCOUNTER — Encounter: Payer: Self-pay | Admitting: General Surgery

## 2017-06-12 ENCOUNTER — Telehealth: Payer: Self-pay | Admitting: Gastroenterology

## 2017-06-12 ENCOUNTER — Ambulatory Visit (INDEPENDENT_AMBULATORY_CARE_PROVIDER_SITE_OTHER): Payer: BLUE CROSS/BLUE SHIELD | Admitting: General Surgery

## 2017-06-12 VITALS — BP 132/82 | HR 60 | Resp 14 | Ht 63.0 in | Wt 181.0 lb

## 2017-06-12 DIAGNOSIS — K8020 Calculus of gallbladder without cholecystitis without obstruction: Secondary | ICD-10-CM | POA: Diagnosis not present

## 2017-06-12 NOTE — Progress Notes (Signed)
Patient ID: Phyllis Wagner, female   DOB: 07/17/1955, 62 y.o.   MRN: [id number]  Chief Complaint  Patient presents with  . Abdominal Pain    HPI Phyllis Wagner is a 62 y.o. female.  Here for evaluation of her gall bladder referred by Dr Elizabeth Sauer. She states she has had reflux for years but for 3 months it has gotten worse and more severe. She has a "spell" twice a week. She states it starts with right lower abdominal pain up to her mid chest like "burning bricks", lasting 1-2 hours, no vomiting, but nausea is daily every morning. Nothing in common triggers this. She has tried tums, tramadol, tylenol and Prilosec with no relief. She states she even tried lying in a fetal position and walking around to see if it helps. Abdominal ultrasound was 06-11-17. She had a visit with Dr Tobi Bastos on 06-10-17.  He had proposed upper and lower endoscopy. She is here with her daughter, Phyllis Wagner. The patient lost 100 pounds with a Roux-en-Y gastric bypass completed.  April 28, 2009 by Dr.Yoo at [facility].  The patient's weight has been stable over the last several years.   Past Medical History:  Diagnosis Date  . Depression   . Hyperlipidemia   . Thyroid disease     Past Surgical History:  Procedure Laterality Date  . COLONOSCOPY  2012   cleared for 10 yrs- [employer]  . GASTRIC BYPASS  2013   Dr Ethelene Hal at [facility]  . KNEE SURGERY Left   . LYMPH NODE DISSECTION    . TONSILLECTOMY    . VAGINAL HYSTERECTOMY     ovaries remain    Family History  Problem Relation Age of Onset  . Diabetes Maternal Grandmother     Social History Social History   Tobacco Use  . Smoking status: Former Games developer  . Smokeless tobacco: Never Used  Substance Use Topics  . Alcohol use: Yes    Alcohol/week: 0.0 oz  . Drug use: No    Allergies  Allergen Reactions  . Nsaids Hives and Swelling  . Aspirin Other (See Comments)  . Biaxin [Clarithromycin]   . Penicillins Other (See  Comments)  . Sulfa Antibiotics Hives    Current Outpatient Medications  Medication Sig Dispense Refill  . cholecalciferol (VITAMIN D) 1000 UNITS tablet Take 1,000 Units by mouth daily.    . citalopram (CELEXA) 20 MG tablet Take 1 tablet (20 mg total) by mouth daily. 30 tablet 0  . fluticasone (FLONASE) 50 MCG/ACT nasal spray Place 2 sprays into both nostrils daily. 16 g 6  . glimepiride (AMARYL) 1 MG tablet TAKE 1 TABLET (1 MG TOTAL) BY MOUTH DAILY WITH BREAKFAST. (NEED APPT FOR FOLLOW UP AND LABS) 30 tablet 0  . glucosamine-chondroitin 500-400 MG tablet Take 2 tablets by mouth daily.    . hydrochlorothiazide (HYDRODIURIL) 25 MG tablet Take 1 tablet (25 mg total) by mouth daily. 30 tablet 0  . levothyroxine (SYNTHROID, LEVOTHROID) 88 MCG tablet Take 1 tablet (88 mcg total) by mouth daily before breakfast. 30 tablet 0  . Omega-3 Fatty Acids (FISH OIL) 1000 MG CAPS Take 1 capsule by mouth daily.    . pantoprazole (PROTONIX) 40 MG tablet Take 1 tablet (40 mg total) by mouth daily. 30 tablet 0  . simvastatin (ZOCOR) 20 MG tablet Take 1 tablet (20 mg total) by mouth daily. 30 tablet 0  . vitamin B-12 (CYANOCOBALAMIN) 1000 MCG tablet Take 1,000 mcg by mouth daily.    Marland Kitchen  sucralfate (CARAFATE) 1 GM/10ML suspension Take 10 mLs (1 g total) by mouth 4 (four) times daily. (Patient not taking: Reported on 06/12/2017) 420 mL 1   No current facility-administered medications for this visit.     Review of Systems Review of Systems  Constitutional: Negative.   Respiratory: Negative.   Cardiovascular: Negative.   Gastrointestinal: Positive for abdominal pain and nausea. Negative for constipation, diarrhea and vomiting.    Blood pressure 132/82, pulse 60, resp. rate 14, height 5\' 3"  (1.6 m), weight 181 lb (82.1 kg), SpO2 98 %.  Physical Exam Physical Exam  Constitutional: She is oriented to person, place, and time. She appears well-developed and well-nourished.  HENT:  Mouth/Throat: Oropharynx is clear  and moist.  Eyes: Conjunctivae are normal.  Neck: Neck supple.  Cardiovascular: Normal rate, regular rhythm, normal heart sounds and intact distal pulses.  Pulses:      Femoral pulses are 2+ on the right side, and 2+ on the left side. Pulmonary/Chest: Effort normal and breath sounds normal.  Abdominal: Soft. Normal appearance and bowel sounds are normal. There is no hepatomegaly. There is tenderness in the right upper quadrant.  Lymphadenopathy:    She has no cervical adenopathy.  Neurological: She is alert and oriented to person, place, and time.  Skin: Skin is warm and dry.  Psychiatric: Her behavior is normal.    Data Reviewed 2011 operative note.  Abdominal ultrasound dated June 11, 2017 showed a contracted gallbladder with mild gallbladder wall thickening at 4 mm.  There was a stone within the neck of the gallbladder measuring 1.1 mm.  Negative sonographic Murphy sign.  No pericholecystic fluid.  Were reported indeterminate for cholecystitis.  June 06, 2017 labs were reviewed.  Random blood sugar 141.  Liver function studies of the same date were entirely normal.  Lipase normal at 28.  CBC dated September 08, 2015 showed a hemoglobin of 13.8 with an MCV of 89, white blood cell count of 4600 with normal differential, platelet count of 229,000.  Assessment    Right upper quadrant pain with radiation to the epigastrium.    Plan    The patient's symptoms are most compatible with biliary colic.  She has had no dysphasia, vomiting without associated pain and long intervals, 3-7 days between episodes, which speaks against obstruction of the Roux loop or significant stenosis at the gastrojejunostomy.  I do not think colonoscopy is going to identify any pathology related to her present symptoms, and while upper endoscopy might show an ulcer her symptoms did not just a primary gastric/anastomotic source.  Laparoscopic Cholecystectomy with Intraoperative Cholangiogram. The procedure,  including it's potential risks and complications (including but not limited to infection, bleeding, injury to intra-abdominal organs or bile ducts, bile leak, poor cosmetic result, sepsis and death) were discussed with the patient in detail. Non-operative options, including their inherent risks (acute calculous cholecystitis with possible choledocholithiasis or gallstone pancreatitis, with the risk of ascending cholangitis, sepsis, and death) were discussed as well. The patient expressed and understanding of what we discussed and wishes to proceed with laparoscopic cholecystectomy. The patient further understands that if it is technically not possible, or it is unsafe to proceed laparoscopically, that I will convert to an open cholecystectomy.    I will arrange for a repeat CBC to be sure she has not developed a metabolic anemia since 2017.  HPI, Physical Exam, Assessment and Plan have been scribed under the direction and in the presence of Earline MayotteJeffrey W. Zanaria Morell, MD. Dorathy DaftMarsha Hatch,  RN  I have completed the exam and reviewed the above documentation for accuracy and completeness.  I agree with the above.  Museum/gallery conservator has been used and any errors in dictation or transcription are unintentional.  Donnalee Curry, M.D., F.A.C.S.  Merrily Pew Merary Garguilo 06/12/2017, 5:15 PM  Patient's surgery has been scheduled for 06-21-17 at [facility].   Nicholes Mango, CMA

## 2017-06-12 NOTE — Patient Instructions (Signed)

## 2017-06-12 NOTE — Telephone Encounter (Signed)
Pt left vm  To inform Dr. Servando SnareWohl pt has had u/s and confirmed she has Doreene NestGalstone she will be having surgery and would like to cancel her upper GI procedure please call pt

## 2017-06-17 ENCOUNTER — Other Ambulatory Visit: Payer: Self-pay

## 2017-06-17 ENCOUNTER — Encounter
Admission: RE | Admit: 2017-06-17 | Discharge: 2017-06-17 | Disposition: A | Discharge status: Home / Self Care | Payer: BLUE CROSS/BLUE SHIELD | Source: Ambulatory Visit | Attending: General Surgery | Admitting: General Surgery

## 2017-06-17 HISTORY — DX: Cardiac murmur, unspecified: R01.1

## 2017-06-17 HISTORY — DX: Other complications of anesthesia, initial encounter: T88.59XA

## 2017-06-17 HISTORY — DX: Personal history of other diseases of the digestive system: Z87.19

## 2017-06-17 HISTORY — DX: Essential (primary) hypertension: I10

## 2017-06-17 HISTORY — DX: Adverse effect of unspecified anesthetic, initial encounter: T41.45XA

## 2017-06-17 HISTORY — DX: Carpal tunnel syndrome, unspecified upper limb: G56.00

## 2017-06-17 HISTORY — DX: Hypothyroidism, unspecified: E03.9

## 2017-06-17 HISTORY — DX: Gastro-esophageal reflux disease without esophagitis: K21.9

## 2017-06-17 HISTORY — DX: Type 2 diabetes mellitus without complications: E11.9

## 2017-06-17 NOTE — Patient Instructions (Signed)
Your procedure is scheduled on: 06-21-17 FRIDAY Report to Same Day Surgery 2nd floor medical mall [facility] Entrance-take elevator on left to 2nd floor.  Check in with surgery information desk.) To find out your arrival time please call [phone] between 1PM - 3PM on 06-20-17 THURSDAY  Remember: Instructions that are not followed completely may result in serious medical risk, up to and including death, or upon the discretion of your surgeon and anesthesiologist your surgery may need to be rescheduled.    _x___ 1. Do not eat food after midnight the night before your procedure. NO GUM OR HARD CANDY AFTER MIDNIGHT.  You may drink WATER up to 2 hours before you are scheduled to arrive at the hospital for your procedure.  Do not drink WATER within 2 hours of your scheduled arrival to the hospital.  Type 1 and type 2 diabetics should only drink water.     __x__ 2. No Alcohol for 24 hours before or after surgery.   __x__3. No Smoking or e-cigarettes for 24 prior to surgery.  Do not use any chewable tobacco products for at least 6 hour prior to surgery   ____  4. Bring all medications with you on the day of surgery if instructed.    __x__ 5. Notify your doctor if there is any change in your medical condition     (cold, fever, infections).    x___6. On the morning of surgery brush your teeth with toothpaste and water.  You may rinse your mouth with mouth wash if you wish.  Do not swallow any toothpaste or mouthwash.   Do not wear jewelry, make-up, hairpins, clips or nail polish.  Do not wear lotions, powders, or perfumes. You may wear deodorant.  Do not shave 48 hours prior to surgery. Men may shave face and neck.  Do not bring valuables to the hospital.    [facility] is not responsible for any belongings or valuables.               Contacts, dentures or bridgework may not be worn into surgery.  Leave your suitcase in the car. After surgery it may be brought to your room.  For patients  admitted to the hospital, discharge time is determined by your treatment team.  _  Patients discharged the day of surgery will not be allowed to drive home.  You will need someone to drive you home and stay with you the night of your procedure.    Please read over the following fact sheets that you were given:   [facility] Preparing for Surgery and or MRSA Information   _x___ TAKE THE FOLLOWING MEDICATION THE MORNING OF SURGERY. These include:  1. LEVOTHYROXINE  2. CELEXA  3. ZOLOFT  4. PROTONIX  5. TAKE AN EXTRA PROTONIX THE NIGHT BEFORE SURGERY  6.  ____Fleets enema or Magnesium Citrate as directed.   _x___ Use CHG Soap or sage wipes as directed on instruction sheet   ____ Use inhalers on the day of surgery and bring to hospital day of surgery  ____ Stop Metformin and Janumet 2 days prior to surgery.    ____ Take 1/2 of usual insulin dose the night before surgery and none on the morning surgery.   ____ Follow recommendations from Cardiologist, Pulmonologist or PCP regarding stopping Aspirin, Coumadin, Plavix ,Eliquis, Effient, or Pradaxa, and Pletal.  ____Stop Anti-inflammatories such as Advil, Aleve, Ibuprofen, Motrin, Naproxen, Naprosyn, Goodies powders or aspirin products. OK to take Tylenol  _x___ Stop supplements until after surgery-STOP GLUCOSAMINE-CHONDROITIN AND FISH OIL NOW-MAY RESUME AFTER SURGERY   ____ Bring C-Pap to the hospital.

## 2017-06-18 ENCOUNTER — Ambulatory Visit: Admit: 2017-06-18 | Payer: BLUE CROSS/BLUE SHIELD | Admitting: Gastroenterology

## 2017-06-18 SURGERY — ESOPHAGOGASTRODUODENOSCOPY (EGD) WITH PROPOFOL
Anesthesia: General

## 2017-06-19 ENCOUNTER — Encounter
Admission: RE | Admit: 2017-06-19 | Discharge: 2017-06-19 | Disposition: A | Discharge status: Home / Self Care | Payer: BLUE CROSS/BLUE SHIELD | Source: Ambulatory Visit | Attending: General Surgery | Admitting: General Surgery

## 2017-06-19 ENCOUNTER — Other Ambulatory Visit: Payer: Self-pay | Admitting: General Surgery

## 2017-06-19 DIAGNOSIS — K8010 Calculus of gallbladder with chronic cholecystitis without obstruction: Secondary | ICD-10-CM | POA: Diagnosis not present

## 2017-06-19 DIAGNOSIS — I10 Essential (primary) hypertension: Secondary | ICD-10-CM | POA: Diagnosis not present

## 2017-06-19 DIAGNOSIS — Z882 Allergy status to sulfonamides status: Secondary | ICD-10-CM | POA: Diagnosis not present

## 2017-06-19 DIAGNOSIS — K219 Gastro-esophageal reflux disease without esophagitis: Secondary | ICD-10-CM | POA: Diagnosis not present

## 2017-06-19 DIAGNOSIS — Z01810 Encounter for preprocedural cardiovascular examination: Secondary | ICD-10-CM | POA: Diagnosis not present

## 2017-06-19 DIAGNOSIS — Z9884 Bariatric surgery status: Secondary | ICD-10-CM | POA: Diagnosis not present

## 2017-06-19 DIAGNOSIS — Z880 Allergy status to penicillin: Secondary | ICD-10-CM | POA: Diagnosis not present

## 2017-06-19 DIAGNOSIS — Z87891 Personal history of nicotine dependence: Secondary | ICD-10-CM | POA: Diagnosis not present

## 2017-06-19 DIAGNOSIS — K420 Umbilical hernia with obstruction, without gangrene: Secondary | ICD-10-CM | POA: Diagnosis not present

## 2017-06-19 DIAGNOSIS — Z886 Allergy status to analgesic agent status: Secondary | ICD-10-CM | POA: Diagnosis not present

## 2017-06-19 DIAGNOSIS — J45909 Unspecified asthma, uncomplicated: Secondary | ICD-10-CM | POA: Diagnosis not present

## 2017-06-19 DIAGNOSIS — Z79890 Hormone replacement therapy (postmenopausal): Secondary | ICD-10-CM | POA: Diagnosis not present

## 2017-06-19 DIAGNOSIS — E785 Hyperlipidemia, unspecified: Secondary | ICD-10-CM | POA: Diagnosis not present

## 2017-06-19 DIAGNOSIS — Z881 Allergy status to other antibiotic agents status: Secondary | ICD-10-CM | POA: Diagnosis not present

## 2017-06-19 DIAGNOSIS — Z79899 Other long term (current) drug therapy: Secondary | ICD-10-CM | POA: Diagnosis not present

## 2017-06-19 DIAGNOSIS — F329 Major depressive disorder, single episode, unspecified: Secondary | ICD-10-CM | POA: Diagnosis not present

## 2017-06-19 LAB — BASIC METABOLIC PANEL
Anion gap: 4 — ABNORMAL LOW (ref 5–15)
BUN: 11 mg/dL (ref 6–20)
CO2: 33 mmol/L — ABNORMAL HIGH (ref 22–32)
Calcium: 9 mg/dL (ref 8.9–10.3)
Chloride: 99 mmol/L — ABNORMAL LOW (ref 101–111)
Creatinine, Ser: 0.62 mg/dL (ref 0.44–1.00)
GFR calc Af Amer: >60 mL/min (ref >60)
GFR calc non Af Amer: >60 mL/min (ref >60)
Glucose, Bld: 124 mg/dL — ABNORMAL HIGH (ref 65–99)
Potassium: 3.4 mmol/L — ABNORMAL LOW (ref 3.5–5.1)
Sodium: 136 mmol/L (ref 135–145)

## 2017-06-19 MED ORDER — POTASSIUM CHLORIDE CRYS ER 20 MEQ PO TBCR: 20.0000 meq | EXTENDED_RELEASE_TABLET | Freq: Three times a day (TID) | ORAL | 0 refills | Status: DC

## 2017-06-19 NOTE — Progress Notes (Signed)
Received notice that the patient's potassium was low at 3.4.  Will supplement tonight and tomorrow in the morning of surgery.  Patient has been contacted and is aware of instructions for use.

## 2017-06-19 NOTE — Care Management (Signed)
EKG reviewed.Poor R wave progression. Otherwise OK. No further consults required.

## 2017-06-19 NOTE — Pre-Procedure Instructions (Signed)
CALLED DR Maisie FusHOMAS REGARDING ABNORMAL EKG-MR # GIVEN TO DR Maisie FusHOMAS AND HE WILL MAKE NOTE IN Epic AS TO WHAT NEEDS TO BE DONE PRIOR TO SURGERY ON 06-21-17

## 2017-06-20 LAB — CBC WITH DIFFERENTIAL/PLATELET
Band Neutrophils: 0 %
Basophils Absolute: 0 10*3/uL (ref 0–0.1)
Basophils Relative: 0 %
Blasts: 0 %
Eosinophils Absolute: 0.2 10*3/uL (ref 0–0.7)
Eosinophils Relative: 4 %
HCT: 35.3 % (ref 35.0–47.0)
Hemoglobin: 12.3 g/dL (ref 12.0–16.0)
Lymphocytes Relative: 29 %
Lymphs Abs: 1.5 10*3/uL (ref 1.0–3.6)
MCH: 31.7 pg (ref 26.0–34.0)
MCHC: 35 g/dL (ref 32.0–36.0)
MCV: 90.5 fL (ref 80.0–100.0)
Metamyelocytes Relative: 0 %
Monocytes Absolute: 0.4 10*3/uL (ref 0.2–0.9)
Monocytes Relative: 8 %
Myelocytes: 0 %
Neutro Abs: 3.2 10*3/uL (ref 1.4–6.5)
Neutrophils Relative %: 59 %
Other: 0 %
Platelets: 200 10*3/uL (ref 150–440)
Promyelocytes Relative: 0 %
RBC: 3.9 MIL/uL (ref 3.80–5.20)
RDW: 13.3 % (ref 11.5–14.5)
WBC: 5.3 10*3/uL (ref 3.6–11.0)
nRBC: 0 /100 WBC

## 2017-06-20 MED ORDER — CEFAZOLIN SODIUM-DEXTROSE 2-4 GM/100ML-% IV SOLN
2.0000 g | INTRAVENOUS | Status: AC
  Administered 2017-06-21: 2 g via INTRAVENOUS
  Filled 2017-06-20: qty 100

## 2017-06-21 ENCOUNTER — Ambulatory Visit
Admission: RE | Admit: 2017-06-21 | Discharge: 2017-06-21 | Disposition: A | Discharge status: Home / Self Care | Payer: BLUE CROSS/BLUE SHIELD | Source: Ambulatory Visit | Attending: General Surgery | Admitting: General Surgery

## 2017-06-21 ENCOUNTER — Encounter
Admission: RE | Disposition: A | Discharge status: Home / Self Care | Payer: Self-pay | Source: Ambulatory Visit | Attending: General Surgery

## 2017-06-21 ENCOUNTER — Ambulatory Visit: Payer: BLUE CROSS/BLUE SHIELD

## 2017-06-21 ENCOUNTER — Other Ambulatory Visit: Payer: Self-pay

## 2017-06-21 ENCOUNTER — Encounter: Payer: Self-pay | Admitting: *Deleted

## 2017-06-21 ENCOUNTER — Ambulatory Visit: Payer: BLUE CROSS/BLUE SHIELD | Admitting: Anesthesiology

## 2017-06-21 DIAGNOSIS — Z87891 Personal history of nicotine dependence: Secondary | ICD-10-CM | POA: Diagnosis not present

## 2017-06-21 DIAGNOSIS — Z886 Allergy status to analgesic agent status: Secondary | ICD-10-CM | POA: Diagnosis not present

## 2017-06-21 DIAGNOSIS — K8020 Calculus of gallbladder without cholecystitis without obstruction: Secondary | ICD-10-CM

## 2017-06-21 DIAGNOSIS — F329 Major depressive disorder, single episode, unspecified: Secondary | ICD-10-CM | POA: Insufficient documentation

## 2017-06-21 DIAGNOSIS — J45909 Unspecified asthma, uncomplicated: Secondary | ICD-10-CM | POA: Diagnosis not present

## 2017-06-21 DIAGNOSIS — K219 Gastro-esophageal reflux disease without esophagitis: Secondary | ICD-10-CM | POA: Insufficient documentation

## 2017-06-21 DIAGNOSIS — Z9884 Bariatric surgery status: Secondary | ICD-10-CM | POA: Diagnosis not present

## 2017-06-21 DIAGNOSIS — Z79899 Other long term (current) drug therapy: Secondary | ICD-10-CM | POA: Diagnosis not present

## 2017-06-21 DIAGNOSIS — Z880 Allergy status to penicillin: Secondary | ICD-10-CM | POA: Diagnosis not present

## 2017-06-21 DIAGNOSIS — Z881 Allergy status to other antibiotic agents status: Secondary | ICD-10-CM | POA: Diagnosis not present

## 2017-06-21 DIAGNOSIS — Z48815 Encounter for surgical aftercare following surgery on the digestive system: Secondary | ICD-10-CM | POA: Diagnosis not present

## 2017-06-21 DIAGNOSIS — K420 Umbilical hernia with obstruction, without gangrene: Secondary | ICD-10-CM | POA: Diagnosis not present

## 2017-06-21 DIAGNOSIS — K8010 Calculus of gallbladder with chronic cholecystitis without obstruction: Secondary | ICD-10-CM | POA: Diagnosis not present

## 2017-06-21 DIAGNOSIS — Z419 Encounter for procedure for purposes other than remedying health state, unspecified: Secondary | ICD-10-CM

## 2017-06-21 DIAGNOSIS — Z79890 Hormone replacement therapy (postmenopausal): Secondary | ICD-10-CM | POA: Diagnosis not present

## 2017-06-21 DIAGNOSIS — E785 Hyperlipidemia, unspecified: Secondary | ICD-10-CM | POA: Diagnosis not present

## 2017-06-21 DIAGNOSIS — I10 Essential (primary) hypertension: Secondary | ICD-10-CM | POA: Insufficient documentation

## 2017-06-21 DIAGNOSIS — Z882 Allergy status to sulfonamides status: Secondary | ICD-10-CM | POA: Diagnosis not present

## 2017-06-21 HISTORY — PX: UMBILICAL HERNIA REPAIR: SHX196

## 2017-06-21 HISTORY — PX: CHOLECYSTECTOMY: SHX55

## 2017-06-21 LAB — GLUCOSE, CAPILLARY: Glucose-Capillary: 99 mg/dL (ref 65–99)

## 2017-06-21 SURGERY — LAPAROSCOPIC CHOLECYSTECTOMY WITH INTRAOPERATIVE CHOLANGIOGRAM
Anesthesia: General | Wound class: Clean Contaminated

## 2017-06-21 MED ORDER — ONDANSETRON HCL 4 MG/2ML IJ SOLN: 4.0000 mg | Freq: Once | INTRAMUSCULAR | Status: DC | PRN

## 2017-06-21 MED ORDER — ONDANSETRON HCL 4 MG/2ML IJ SOLN
INTRAMUSCULAR | Status: DC | PRN
  Administered 2017-06-21: 4 mg via INTRAVENOUS

## 2017-06-21 MED ORDER — BUPIVACAINE-EPINEPHRINE (PF) 0.5% -1:200000 IJ SOLN
INTRAMUSCULAR | Status: AC
  Filled 2017-06-21: qty 30

## 2017-06-21 MED ORDER — ACETAMINOPHEN 10 MG/ML IV SOLN
INTRAVENOUS | Status: DC | PRN
  Administered 2017-06-21: 1000 mg via INTRAVENOUS

## 2017-06-21 MED ORDER — PHENYLEPHRINE HCL 10 MG/ML IJ SOLN
INTRAMUSCULAR | Status: DC | PRN
  Administered 2017-06-21: 100 ug via INTRAVENOUS

## 2017-06-21 MED ORDER — MIDAZOLAM HCL 2 MG/2ML IJ SOLN
INTRAMUSCULAR | Status: DC | PRN
  Administered 2017-06-21: 2 mg via INTRAVENOUS

## 2017-06-21 MED ORDER — SUCCINYLCHOLINE CHLORIDE 20 MG/ML IJ SOLN
INTRAMUSCULAR | Status: DC | PRN
  Administered 2017-06-21: 100 mg via INTRAVENOUS

## 2017-06-21 MED ORDER — PROPOFOL 10 MG/ML IV BOLUS
INTRAVENOUS | Status: DC | PRN
  Administered 2017-06-21: 150 mg via INTRAVENOUS

## 2017-06-21 MED ORDER — SUGAMMADEX SODIUM 200 MG/2ML IV SOLN
INTRAVENOUS | Status: DC | PRN
  Administered 2017-06-21: 350 mg via INTRAVENOUS

## 2017-06-21 MED ORDER — HYDROCODONE-ACETAMINOPHEN 5-325 MG PO TABS: 1.0000 | ORAL_TABLET | ORAL | 0 refills | Status: DC | PRN

## 2017-06-21 MED ORDER — EPHEDRINE SULFATE 50 MG/ML IJ SOLN
INTRAMUSCULAR | Status: DC | PRN
  Administered 2017-06-21: 15 mg via INTRAVENOUS

## 2017-06-21 MED ORDER — LIDOCAINE HCL (CARDIAC) 20 MG/ML IV SOLN
INTRAVENOUS | Status: DC | PRN
  Administered 2017-06-21: 100 mg via INTRAVENOUS

## 2017-06-21 MED ORDER — SUGAMMADEX SODIUM 200 MG/2ML IV SOLN
INTRAVENOUS | Status: AC
  Filled 2017-06-21: qty 2

## 2017-06-21 MED ORDER — FENTANYL CITRATE (PF) 100 MCG/2ML IJ SOLN
INTRAMUSCULAR | Status: AC
  Administered 2017-06-21: 25 ug via INTRAVENOUS
  Filled 2017-06-21: qty 2

## 2017-06-21 MED ORDER — GLYCOPYRROLATE 0.2 MG/ML IJ SOLN
INTRAMUSCULAR | Status: DC | PRN
  Administered 2017-06-21: 0.2 mg via INTRAVENOUS

## 2017-06-21 MED ORDER — ROCURONIUM BROMIDE 100 MG/10ML IV SOLN
INTRAVENOUS | Status: DC | PRN
  Administered 2017-06-21: 40 mg via INTRAVENOUS
  Administered 2017-06-21: 20 mg via INTRAVENOUS
  Administered 2017-06-21: 10 mg via INTRAVENOUS

## 2017-06-21 MED ORDER — FENTANYL CITRATE (PF) 100 MCG/2ML IJ SOLN
INTRAMUSCULAR | Status: DC | PRN
  Administered 2017-06-21: 50 ug via INTRAVENOUS
  Administered 2017-06-21: 100 ug via INTRAVENOUS

## 2017-06-21 MED ORDER — SODIUM CHLORIDE 0.9 % IV SOLN
INTRAVENOUS | Status: DC
  Administered 2017-06-21 (×2): via INTRAVENOUS

## 2017-06-21 MED ORDER — FENTANYL CITRATE (PF) 100 MCG/2ML IJ SOLN
INTRAMUSCULAR | Status: AC
Start: 2017-06-21 — End: ?
  Filled 2017-06-21: qty 4

## 2017-06-21 MED ORDER — DEXAMETHASONE SODIUM PHOSPHATE 10 MG/ML IJ SOLN
INTRAMUSCULAR | Status: DC | PRN
  Administered 2017-06-21: 10 mg via INTRAVENOUS

## 2017-06-21 MED ORDER — SODIUM CHLORIDE 0.9 % IJ SOLN
INTRAMUSCULAR | Status: AC
  Filled 2017-06-21: qty 50

## 2017-06-21 MED ORDER — SODIUM CHLORIDE 0.9 % IV SOLN
INTRAVENOUS | Status: DC | PRN
  Administered 2017-06-21: 18 mL

## 2017-06-21 MED ORDER — CEFAZOLIN SODIUM-DEXTROSE 2-3 GM-%(50ML) IV SOLR
INTRAVENOUS | Status: AC
  Filled 2017-06-21: qty 50

## 2017-06-21 MED ORDER — BUPIVACAINE-EPINEPHRINE (PF) 0.5% -1:200000 IJ SOLN
INTRAMUSCULAR | Status: DC | PRN
  Administered 2017-06-21: 25 mL via PERINEURAL

## 2017-06-21 MED ORDER — ACETAMINOPHEN 10 MG/ML IV SOLN
INTRAVENOUS | Status: AC
  Filled 2017-06-21: qty 100

## 2017-06-21 MED ORDER — ROCURONIUM BROMIDE 50 MG/5ML IV SOLN
INTRAVENOUS | Status: AC
  Filled 2017-06-21: qty 1

## 2017-06-21 MED ORDER — MIDAZOLAM HCL 2 MG/2ML IJ SOLN
INTRAMUSCULAR | Status: AC
  Filled 2017-06-21: qty 2

## 2017-06-21 MED ORDER — FENTANYL CITRATE (PF) 100 MCG/2ML IJ SOLN
25.0000 ug | INTRAMUSCULAR | Status: DC | PRN
  Administered 2017-06-21 (×3): 25 ug via INTRAVENOUS

## 2017-06-21 SURGICAL SUPPLY — 39 items
APPLIER CLIP ROT 10 11.4 M/L (STAPLE) ×3
BLADE SURG 11 STRL SS SAFETY (MISCELLANEOUS) ×3 IMPLANT
CANISTER SUCT 1200ML W/VALVE (MISCELLANEOUS) ×3 IMPLANT
CANNULA DILATOR 10 W/SLV (CANNULA) ×3 IMPLANT
CANNULA DILATOR 5 W/SLV (CANNULA) ×6 IMPLANT
CATH CHOLANG 76X19 KUMAR (CATHETERS) ×3 IMPLANT
CHLORAPREP W/TINT 26ML (MISCELLANEOUS) ×3 IMPLANT
CLIP APPLIE ROT 10 11.4 M/L (STAPLE) ×2 IMPLANT
CONRAY 60ML FOR OR (MISCELLANEOUS) ×3 IMPLANT
DISSECTOR KITTNER STICK (MISCELLANEOUS) ×2 IMPLANT
DISSECTORS/KITTNER STICK (MISCELLANEOUS) ×3
DRAPE SHEET LG 3/4 BI-LAMINATE (DRAPES) ×3 IMPLANT
DRSG TEGADERM 2-3/8X2-3/4 SM (GAUZE/BANDAGES/DRESSINGS) ×12 IMPLANT
DRSG TELFA 4X3 1S NADH ST (GAUZE/BANDAGES/DRESSINGS) ×3 IMPLANT
ELECT REM PT RETURN 9FT ADLT (ELECTROSURGICAL) ×3
ELECTRODE REM PT RTRN 9FT ADLT (ELECTROSURGICAL) ×2 IMPLANT
GLOVE BIO SURGEON STRL SZ7.5 (GLOVE) ×3 IMPLANT
GLOVE INDICATOR 8.0 STRL GRN (GLOVE) ×3 IMPLANT
GOWN STRL REUS W/ TWL LRG LVL3 (GOWN DISPOSABLE) ×6 IMPLANT
GOWN STRL REUS W/TWL LRG LVL3 (GOWN DISPOSABLE) ×3
IRRIGATION STRYKERFLOW (MISCELLANEOUS) ×2 IMPLANT
IRRIGATOR STRYKERFLOW (MISCELLANEOUS) ×3
IV LACTATED RINGERS 1000ML (IV SOLUTION) ×3 IMPLANT
KIT TURNOVER KIT A (KITS) ×3 IMPLANT
LABEL OR SOLS (LABEL) ×3 IMPLANT
NDL INSUFF ACCESS 14 VERSASTEP (NEEDLE) ×3 IMPLANT
NS IRRIG 500ML POUR BTL (IV SOLUTION) ×3 IMPLANT
PACK LAP CHOLECYSTECTOMY (MISCELLANEOUS) ×3 IMPLANT
POUCH SPECIMEN RETRIEVAL 10MM (ENDOMECHANICALS) ×3 IMPLANT
SCISSORS METZENBAUM CVD 33 (INSTRUMENTS) ×3 IMPLANT
STRIP CLOSURE SKIN 1/2X4 (GAUZE/BANDAGES/DRESSINGS) ×3 IMPLANT
SUT MAXON ABS #0 GS21 30IN (SUTURE) ×3 IMPLANT
SUT VIC AB 0 CT2 27 (SUTURE) ×3 IMPLANT
SUT VIC AB 4-0 FS2 27 (SUTURE) ×3 IMPLANT
SWABSTK COMLB BENZOIN TINCTURE (MISCELLANEOUS) ×3 IMPLANT
TROCAR XCEL BLUNT TIP 100MML (ENDOMECHANICALS) ×3 IMPLANT
TROCAR XCEL NON-BLD 11X100MML (ENDOMECHANICALS) ×3 IMPLANT
TUBING INSUFFLATION (TUBING) ×3 IMPLANT
WATER STERILE IRR 1000ML POUR (IV SOLUTION) ×3 IMPLANT

## 2017-06-21 NOTE — Progress Notes (Signed)
She developed a rash in the day surgery area about 3 hours after Kefzol administration.  Past history of rash with penicillin.  No respiratory symptoms.  Normal oximetry and vital signs.  She will make use of Claritin at home followed by Benadryl if the rash persists.  She knows to come promptly to the hospital if she develops any respiratory distress.

## 2017-06-21 NOTE — Op Note (Signed)
Preoperative diagnosis: Chronic cholecystitis and cholelithiasis.  Postoperative diagnosis: Same, with umbilical hernia and incarcerated fat.  Operative procedure: Laparoscopic cholecystectomy with intraoperative cholangiograms, repair of umbilical hernia.  Operating Surgeon: Donnalee CurryJeffrey Juluis Fitzsimmons, MD.  Anesthesia: General endotracheal, Marcaine 0.5% with 1-200,000 units of epinephrine, 25 cc.  Estimated blood loss: 10 cc.  Clinical note: This 62 year old woman has had chronic, recurrent episodic right upper quadrant pain.  Ultrasound showed evidence of a 12 mm stone lodged in the neck of the gallbladder.  She was felt to be a candidate for cholecystectomy.  She received Kefzol prior to the procedure.  SCD stockings for DVT prevention.  Operative note: The patient tolerated general endotracheal anesthesia well.  Palpation of the abdomen showed a nonreducible mass at the umbilicus.  The abdomen was cleansed with ChloraPrep and draped.  An incision was made above the level of the umbilicus along the midline.  The skin was incised sharply.  The deep tissue was spread to expose the incarcerated fat at the umbilicus.  The fascia was opened to allow the fat to be reduced in the intra-abdominal cavity.  A 0 Maxon suture was used followed by placement of a Hossein cannula.  No evidence of injury from initial port placement.  The patient was placed in the reverse Trendelenburg position and rolled to the left.  An 11 mm XL port was placed in the epigastrium.  2-5 mm Step ports were placed in the right lateral abdominal wall.  The gallbladder was noted to be thickened and chronically scarred.  The gallbladder was placed on cephalad traction.  Examination of the neck of the gallbladder showed close approximation of the neck to the common bile duct and examination showed a broad short cystic duct.  This was carefully dissected free from the common hepatic duct.  Fluoroscopic cholangiograms were completed using 18 cc of  one half strength Conray 60 with a Kumar catheter.  This showed anatomy as described above.  The cystic duct was doubly clipped and divided.  The gallbladder was wedged into the fossa and a tedious dissection of this area was undertaken.  Branches of the cystic artery were doubly clipped and divided.  The gallbladder was then removed from the liver bed making use of hook cautery dissection.  The gallbladder was placed into an Endo Catch bag and then delivered to the umbilical port site.  Inspection from the epigastric site showed no evidence of injury from initial port placement.  The right upper quadrant was irrigated with lactated Ringer solution.  Good hemostasis was noted.  Clips were intact.  In particular the cystic duct was completely encompassed by both clips.  The common bile duct and common hepatic duct were preserved and not impinged.  The abdomen was then desufflated and ports removed under direct vision.  The fascia at the umbilicus was closed with 0 Maxon figure-of-eight sutures.  Subcutaneous tissue approximated with 3-0 Vicryl figure-of-eight suture.  The skin was closed with a running 4-0 Vicryl subcuticular suture.  Benzoin, Steri-Strips, Telfa and Tegaderm dressings were applied.  The patient tolerated the procedure well and was taken recovery room in stable condition.

## 2017-06-21 NOTE — Anesthesia Procedure Notes (Signed)
Procedure Name: Intubation Date/Time: 06/21/2017 2:10 PM Performed by: Nelda Marseille, CRNA Pre-anesthesia Checklist: Patient identified, Patient being monitored, Timeout performed, Emergency Drugs available and Suction available Patient Re-evaluated:Patient Re-evaluated prior to induction Oxygen Delivery Method: Circle System Utilized Preoxygenation: Pre-oxygenation with 100% oxygen Induction Type: IV induction Ventilation: Mask ventilation without difficulty Laryngoscope Size: Mac and 3 Grade View: Grade II Tube type: Oral Tube size: 7.0 mm Number of attempts: 1 Airway Equipment and Method: Stylet Placement Confirmation: ETT inserted through vocal cords under direct vision,  positive ETCO2 and breath sounds checked- equal and bilateral Secured at: 21 cm Tube secured with: Tape Dental Injury: Teeth and Oropharynx as per pre-operative assessment

## 2017-06-21 NOTE — H&P (Signed)
No interval change in clinical history or exam.  For cholecystectomy.  

## 2017-06-21 NOTE — Discharge Instructions (Signed)

## 2017-06-21 NOTE — Transfer of Care (Signed)
Immediate Anesthesia Transfer of Care Note  Patient: Phyllis Wagner  Procedure(s) Performed: Procedure(s): LAPAROSCOPIC CHOLECYSTECTOMY WITH INTRAOPERATIVE CHOLANGIOGRAM (N/A) HERNIA REPAIR UMBILICAL ADULT  Patient Location: PACU  Anesthesia Type:General  Level of Consciousness: sedated  Airway & Oxygen Therapy: Patient Spontanous Breathing and Patient connected to face mask oxygen  Post-op Assessment: Report given to RN and Post -op Vital signs reviewed and stable  Post vital signs: Reviewed and stable  Last Vitals:  Vitals:   06/21/17 1217 06/21/17 1544  BP: 136/86 127/72  Pulse: (!) 56 87  Resp: 16 16  Temp: (!) 36.1 C (!) 36.4 C  SpO2: 99% 99%    Complications: No apparent anesthesia complications

## 2017-06-21 NOTE — OR Nursing (Signed)
Pt with rash to arms/neck/chest.  Denies other symptoms.  Dr. Maisie Fushomas notified of same, and in to see pt.  Pt. Refuses benadryl, advises she will take benadryl at home if needed and will call 911 if breathing issues occur.  Dr. Lemar LivingsByrnett in to see pt as well 1720, aware re same and advises ok to d/c pt to home.

## 2017-06-21 NOTE — Anesthesia Postprocedure Evaluation (Signed)
Anesthesia Post Note  Patient: Phyllis Wagner  Procedure(s) Performed: LAPAROSCOPIC CHOLECYSTECTOMY WITH INTRAOPERATIVE CHOLANGIOGRAM (N/A ) HERNIA REPAIR UMBILICAL ADULT  Patient location during evaluation: PACU Anesthesia Type: General Level of consciousness: awake and alert Pain management: pain level controlled Vital Signs Assessment: post-procedure vital signs reviewed and stable Respiratory status: spontaneous breathing, nonlabored ventilation, respiratory function stable and patient connected to nasal cannula oxygen Cardiovascular status: blood pressure returned to baseline and stable Postop Assessment: no apparent nausea or vomiting Anesthetic complications: no     Last Vitals:  Vitals:   06/21/17 1637 06/21/17 1701  BP: 125/73 (!) 109/59  Pulse: 70 77  Resp: 18 18  Temp: 36.6 C 36.6 C  SpO2: 95% 97%    Last Pain:  Vitals:   06/21/17 1701  TempSrc: Temporal  PainSc: 2                  Mckyle Solanki S

## 2017-06-21 NOTE — Anesthesia Post-op Follow-up Note (Signed)
Anesthesia QCDR form completed.        

## 2017-06-21 NOTE — Anesthesia Preprocedure Evaluation (Signed)
Anesthesia Evaluation  Patient identified by MRN, date of birth, ID band Patient awake    Reviewed: Allergy & Precautions, NPO status , Patient's Chart, lab work & pertinent test results  History of Anesthesia Complications (+) PROLONGED EMERGENCE and history of anesthetic complications  Airway Mallampati: II  TM Distance: >3 FB     Dental no notable dental hx.    Pulmonary asthma , former smoker,    Pulmonary exam normal        Cardiovascular hypertension, Normal cardiovascular exam+ Valvular Problems/Murmurs      Neuro/Psych Depression  Neuromuscular disease    GI/Hepatic Neg liver ROS, hiatal hernia, GERD  ,  Endo/Other  diabetesHypothyroidism   Renal/GU negative Renal ROS  negative genitourinary   Musculoskeletal negative musculoskeletal ROS (+)   Abdominal Normal abdominal exam  (+)   Peds negative pediatric ROS (+)  Hematology negative hematology ROS (+)   Anesthesia Other Findings   Reproductive/Obstetrics                             Anesthesia Physical Anesthesia Plan  ASA: II  Anesthesia Plan: General   Post-op Pain Management:    Induction: Intravenous  PONV Risk Score and Plan:   Airway Management Planned: Oral ETT  Additional Equipment:   Intra-op Plan:   Post-operative Plan: Extubation in OR  Informed Consent: I have reviewed the patients History and Physical, chart, labs and discussed the procedure including the risks, benefits and alternatives for the proposed anesthesia with the patient or authorized representative who has indicated his/her understanding and acceptance.   Dental advisory given  Plan Discussed with: CRNA and Surgeon  Anesthesia Plan Comments:         Anesthesia Quick Evaluation

## 2017-06-22 ENCOUNTER — Encounter: Payer: Self-pay | Admitting: General Surgery

## 2017-06-24 ENCOUNTER — Other Ambulatory Visit: Payer: BLUE CROSS/BLUE SHIELD

## 2017-06-25 LAB — SURGICAL PATHOLOGY

## 2017-07-02 ENCOUNTER — Ambulatory Visit (INDEPENDENT_AMBULATORY_CARE_PROVIDER_SITE_OTHER): Payer: BLUE CROSS/BLUE SHIELD | Admitting: General Surgery

## 2017-07-02 ENCOUNTER — Encounter: Payer: Self-pay | Admitting: General Surgery

## 2017-07-02 VITALS — BP 130/80 | HR 84 | Resp 14 | Ht 64.0 in | Wt 181.0 lb

## 2017-07-02 DIAGNOSIS — K8020 Calculus of gallbladder without cholecystitis without obstruction: Secondary | ICD-10-CM

## 2017-07-02 NOTE — Patient Instructions (Addendum)
  Return as needed. Proper lifting techniques reviewed. The patient is aware to call back for any questions or concerns.    

## 2017-07-02 NOTE — Progress Notes (Signed)
Patient ID: Phyllis Wagner, female   DOB: Jan 16, 1956, 62 y.o.   MRN: [id number]  Chief Complaint  Patient presents with  . Routine Post Op    HPI Phyllis Wagner is a 62 y.o. female here today for her post op cholecystectomy done on 06/21/2017. Patient states she is doing well.Moving her bowels daily.   HPI  Past Medical History:  Diagnosis Date  . Carpal tunnel syndrome   . Complication of anesthesia    HARD TO WAKE UP AFTER LYMPH NODE REMOVAL AND CHILDBIRTH  . Depression   . Diabetes mellitus without complication (HCC)   . GERD (gastroesophageal reflux disease)   . Heart murmur   . History of hiatal hernia   . Hyperlipidemia   . Hypertension   . Hypothyroidism   . Thyroid disease     Past Surgical History:  Procedure Laterality Date  . CHOLECYSTECTOMY N/A 06/21/2017   Procedure: LAPAROSCOPIC CHOLECYSTECTOMY WITH INTRAOPERATIVE CHOLANGIOGRAM;  Surgeon: Earline Mayotte, MD;  Location: [facility] ORS;  Service: General;  Laterality: N/A;  . COLONOSCOPY  2012   cleared for 10 yrs- [employer]  . EYE SURGERY Bilateral 2018  . GASTRIC BYPASS  2013   Dr Ethelene Hal at [facility]  . KNEE SURGERY Left   . LYMPH NODE DISSECTION    . TONSILLECTOMY    . UMBILICAL HERNIA REPAIR  06/21/2017   Procedure: HERNIA REPAIR UMBILICAL ADULT;  Surgeon: Earline Mayotte, MD;  Location: [facility] ORS;  Service: General;;  . VAGINAL HYSTERECTOMY     ovaries remain    Family History  Problem Relation Age of Onset  . Diabetes Maternal Grandmother     Social History Social History   Tobacco Use  . Smoking status: Former Smoker    Packs/day: 1.00    Years: 20.00    Pack years: 20.00    Types: Cigarettes    Last attempt to quit: 06/18/2010    Years since quitting: 7.0  . Smokeless tobacco: Never Used  Substance Use Topics  . Alcohol use: Not Currently    Alcohol/week: 0.0 oz  . Drug use: No    Allergies  Allergen Reactions  . Aspirin Anaphylaxis, Hives and Shortness Of Breath  .  Biaxin [Clarithromycin] Anaphylaxis, Hives and Shortness Of Breath  . Nsaids Hives and Swelling  . Penicillins Anaphylaxis, Hives and Swelling    Has patient had a PCN reaction causing immediate rash, facial/tongue/throat swelling, SOB or lightheadedness with hypotension:Yes Has patient had a PCN reaction causing severe rash involving mucus membranes or skin necrosis: Unknown Has patient had a PCN reaction that required hospitalization: Unknown Has patient had a PCN reaction occurring within the last 10 years: No Childhood reaction. If all of the above answers are "NO", then may proceed with Cephalosporin use.   . Sulfa Antibiotics Hives  . Ancef [Cefazolin] Rash    Current Outpatient Medications  Medication Sig Dispense Refill  . acetaminophen (TYLENOL 8 HOUR ARTHRITIS PAIN) 650 MG CR tablet Take 1,300-1,950 mg by mouth every 8 (eight) hours as needed (for joint pain.).    Marland Kitchen acetaminophen (TYLENOL) 500 MG tablet Take 1,000-1,500 mg by mouth every 6 (six) hours as needed (for pain/headaches.).    Marland Kitchen cholecalciferol (VITAMIN D) 1000 UNITS tablet Take 1,000 Units by mouth daily.    . citalopram (CELEXA) 20 MG tablet Take 1 tablet (20 mg total) by mouth daily. (Patient taking differently: Take 20 mg by mouth every morning. ) 30 tablet 0  . fluticasone (FLONASE)  50 MCG/ACT nasal spray Place 2 sprays into both nostrils daily. (Patient taking differently: Place 2 sprays into both nostrils as needed. ) 16 g 6  . glimepiride (AMARYL) 1 MG tablet TAKE 1 TABLET (1 MG TOTAL) BY MOUTH DAILY WITH BREAKFAST. (NEED APPT FOR FOLLOW UP AND LABS) 30 tablet 0  . Glucosamine-Chondroitin (COSAMIN DS PO) Take 2 tablets by mouth daily.    . hydrochlorothiazide (HYDRODIURIL) 25 MG tablet Take 1 tablet (25 mg total) by mouth daily. 30 tablet 0  . levothyroxine (SYNTHROID, LEVOTHROID) 88 MCG tablet Take 1 tablet (88 mcg total) by mouth daily before breakfast. 30 tablet 0  . Lifitegrast (XIIDRA) 5 % SOLN Place 1 drop  into both eyes 2 (two) times daily.    . Omega-3 Fatty Acids (FISH OIL) 1000 MG CAPS Take 1,000 mg by mouth daily.     . pantoprazole (PROTONIX) 40 MG tablet Take 1 tablet (40 mg total) by mouth daily. (Patient taking differently: Take 40 mg by mouth every morning. ) 30 tablet 0  . Polyethyl Glycol-Propyl Glycol (SYSTANE ULTRA) 0.4-0.3 % SOLN Place 1 drop into both eyes 3 (three) times daily as needed (for dry eyes.).    Marland Kitchen. potassium chloride SA (K-DUR,KLOR-CON) 20 MEQ tablet Take 1 tablet (20 mEq total) by mouth 3 (three) times daily. Take one dose today, three doses tomorrow and one dose the morning of surgery. 10 tablet 0  . simvastatin (ZOCOR) 20 MG tablet Take 1 tablet (20 mg total) by mouth daily. (Patient taking differently: Take 20 mg by mouth every morning. ) 30 tablet 0  . sucralfate (CARAFATE) 1 GM/10ML suspension Take 10 mLs (1 g total) by mouth 4 (four) times daily. 420 mL 1  . vitamin B-12 (CYANOCOBALAMIN) 500 MCG tablet Take 500 mcg by mouth daily.     No current facility-administered medications for this visit.     Review of Systems Review of Systems  Constitutional: Negative.   Respiratory: Negative.   Cardiovascular: Negative.     Blood pressure 130/80, pulse 84, resp. rate 14, height 5\' 4"  (1.626 m), weight 181 lb (82.1 kg).  Physical Exam Physical Exam  Constitutional: She is oriented to person, place, and time. She appears well-developed and well-nourished.  Cardiovascular: Normal rate, regular rhythm and normal heart sounds.  Pulmonary/Chest: Effort normal and breath sounds normal.  Abdominal: Soft. Bowel sounds are normal. There is no tenderness.  Port sites are clean and healing well.   Neurological: She is alert and oriented to person, place, and time.  Skin: Skin is warm and dry.    Data Reviewed DIAGNOSIS:  A. GALLBLADDER; CHOLECYSTECTOMY:  - CHRONIC CHOLECYSTITIS AND CHOLELITHIASIS.   Assessment    Doing well post cholecystectomy.    Plan Return  as needed.Proper lifting techniques reviewed.The patient is aware to call back for any questions or concerns.  The patient was encouraged to use proper lifting techniques.  She should not be lifting bales of hay for the next few weeks. HPI, Physical Exam, Assessment and Plan have been scribed under the direction and in the presence of Donnalee CurryJeffrey Shenoa Hattabaugh, MD.  Ples SpecterJessica Qualls, CMA   I have completed the exam and reviewed the above documentation for accuracy and completeness.  I agree with the above.  [employer] has been used and any errors in dictation or transcription are unintentional.  Donnalee CurryJeffrey Nayelly Laughman, M.D., F.A.C.S.  Merrily PewJeffrey W Bosten Newstrom 07/02/2017, 10:36 AM

## 2017-07-09 DIAGNOSIS — H04123 Dry eye syndrome of bilateral lacrimal glands: Secondary | ICD-10-CM | POA: Diagnosis not present

## 2017-07-09 DIAGNOSIS — H018 Other specified inflammations of eyelid: Secondary | ICD-10-CM | POA: Diagnosis not present

## 2017-07-11 ENCOUNTER — Other Ambulatory Visit: Payer: Self-pay | Admitting: Family Medicine

## 2017-07-11 DIAGNOSIS — K219 Gastro-esophageal reflux disease without esophagitis: Secondary | ICD-10-CM

## 2017-07-11 DIAGNOSIS — E039 Hypothyroidism, unspecified: Secondary | ICD-10-CM

## 2017-07-11 DIAGNOSIS — I10 Essential (primary) hypertension: Secondary | ICD-10-CM

## 2017-07-11 DIAGNOSIS — E785 Hyperlipidemia, unspecified: Secondary | ICD-10-CM

## 2017-09-05 DIAGNOSIS — M1812 Unilateral primary osteoarthritis of first carpometacarpal joint, left hand: Secondary | ICD-10-CM | POA: Diagnosis not present

## 2017-09-05 DIAGNOSIS — S63502A Unspecified sprain of left wrist, initial encounter: Secondary | ICD-10-CM | POA: Diagnosis not present

## 2017-09-05 DIAGNOSIS — S20212A Contusion of left front wall of thorax, initial encounter: Secondary | ICD-10-CM | POA: Diagnosis not present

## 2017-09-05 DIAGNOSIS — R0781 Pleurodynia: Secondary | ICD-10-CM | POA: Diagnosis not present

## 2017-09-05 DIAGNOSIS — S299XXA Unspecified injury of thorax, initial encounter: Secondary | ICD-10-CM | POA: Diagnosis not present

## 2017-09-05 DIAGNOSIS — M25542 Pain in joints of left hand: Secondary | ICD-10-CM | POA: Diagnosis not present

## 2017-09-23 DIAGNOSIS — S63502A Unspecified sprain of left wrist, initial encounter: Secondary | ICD-10-CM | POA: Diagnosis not present

## 2017-09-23 DIAGNOSIS — M1812 Unilateral primary osteoarthritis of first carpometacarpal joint, left hand: Secondary | ICD-10-CM | POA: Diagnosis not present

## 2017-10-16 ENCOUNTER — Telehealth: Payer: Self-pay | Admitting: Gastroenterology

## 2017-10-16 NOTE — Telephone Encounter (Signed)
Pt  Is calling to speak to office manager she states she had left a prev. Message about this. She saw Dr. Tobi BastosAnna 06/10/17 and saw him for only 10 minutes to get misdiagnosed she states she received a bill for $424 that she feels she should not have to pay for misdiagnosis please call pt.

## 2017-10-21 DIAGNOSIS — M25569 Pain in unspecified knee: Secondary | ICD-10-CM | POA: Diagnosis not present

## 2017-10-22 ENCOUNTER — Other Ambulatory Visit: Payer: Self-pay | Admitting: Family Medicine

## 2017-10-22 DIAGNOSIS — R5381 Other malaise: Secondary | ICD-10-CM

## 2017-10-22 DIAGNOSIS — F3341 Major depressive disorder, recurrent, in partial remission: Secondary | ICD-10-CM

## 2017-10-22 DIAGNOSIS — R5383 Other fatigue: Principal | ICD-10-CM

## 2017-11-23 ENCOUNTER — Other Ambulatory Visit: Payer: Self-pay | Admitting: Family Medicine

## 2017-11-23 DIAGNOSIS — F3341 Major depressive disorder, recurrent, in partial remission: Secondary | ICD-10-CM

## 2017-11-23 DIAGNOSIS — R5383 Other fatigue: Principal | ICD-10-CM

## 2017-11-23 DIAGNOSIS — R5381 Other malaise: Secondary | ICD-10-CM

## 2017-11-24 ENCOUNTER — Other Ambulatory Visit: Payer: Self-pay | Admitting: Family Medicine

## 2017-11-24 DIAGNOSIS — E785 Hyperlipidemia, unspecified: Secondary | ICD-10-CM

## 2017-11-24 DIAGNOSIS — E039 Hypothyroidism, unspecified: Secondary | ICD-10-CM

## 2017-11-28 ENCOUNTER — Other Ambulatory Visit: Payer: Self-pay | Admitting: Family Medicine

## 2017-11-28 DIAGNOSIS — E039 Hypothyroidism, unspecified: Secondary | ICD-10-CM

## 2017-12-14 DIAGNOSIS — J069 Acute upper respiratory infection, unspecified: Secondary | ICD-10-CM | POA: Diagnosis not present

## 2017-12-19 ENCOUNTER — Other Ambulatory Visit: Payer: Self-pay | Admitting: Family Medicine

## 2017-12-19 DIAGNOSIS — R5383 Other fatigue: Principal | ICD-10-CM

## 2017-12-19 DIAGNOSIS — F3341 Major depressive disorder, recurrent, in partial remission: Secondary | ICD-10-CM

## 2017-12-19 DIAGNOSIS — E039 Hypothyroidism, unspecified: Secondary | ICD-10-CM

## 2017-12-19 DIAGNOSIS — R5381 Other malaise: Secondary | ICD-10-CM

## 2018-01-03 ENCOUNTER — Other Ambulatory Visit: Payer: Self-pay | Admitting: Family Medicine

## 2018-01-03 DIAGNOSIS — E039 Hypothyroidism, unspecified: Secondary | ICD-10-CM

## 2018-01-07 ENCOUNTER — Other Ambulatory Visit: Payer: Self-pay | Admitting: Family Medicine

## 2018-01-07 DIAGNOSIS — R5383 Other fatigue: Principal | ICD-10-CM

## 2018-01-07 DIAGNOSIS — R5381 Other malaise: Secondary | ICD-10-CM

## 2018-01-07 DIAGNOSIS — F3341 Major depressive disorder, recurrent, in partial remission: Secondary | ICD-10-CM

## 2018-01-13 DIAGNOSIS — H04123 Dry eye syndrome of bilateral lacrimal glands: Secondary | ICD-10-CM | POA: Diagnosis not present

## 2018-01-13 DIAGNOSIS — M25569 Pain in unspecified knee: Secondary | ICD-10-CM | POA: Diagnosis not present

## 2018-01-28 ENCOUNTER — Other Ambulatory Visit: Payer: Self-pay | Admitting: Family Medicine

## 2018-01-28 DIAGNOSIS — R5383 Other fatigue: Secondary | ICD-10-CM

## 2018-01-28 DIAGNOSIS — E039 Hypothyroidism, unspecified: Secondary | ICD-10-CM

## 2018-01-28 DIAGNOSIS — R5381 Other malaise: Secondary | ICD-10-CM

## 2018-01-28 DIAGNOSIS — F3341 Major depressive disorder, recurrent, in partial remission: Secondary | ICD-10-CM

## 2018-01-28 MED ORDER — LEVOTHYROXINE SODIUM 88 MCG PO TABS: 88.0000 ug | ORAL_TABLET | Freq: Every day | ORAL | 0 refills | Status: DC

## 2018-01-28 MED ORDER — CITALOPRAM HYDROBROMIDE 20 MG PO TABS: 20.0000 mg | ORAL_TABLET | Freq: Every day | ORAL | 0 refills | Status: DC

## 2018-02-04 ENCOUNTER — Ambulatory Visit (INDEPENDENT_AMBULATORY_CARE_PROVIDER_SITE_OTHER): Payer: BLUE CROSS/BLUE SHIELD | Admitting: Family Medicine

## 2018-02-04 ENCOUNTER — Encounter: Payer: Self-pay | Admitting: Family Medicine

## 2018-02-04 VITALS — BP 130/80 | HR 76 | Ht 63.0 in | Wt 177.0 lb

## 2018-02-04 DIAGNOSIS — R5383 Other fatigue: Secondary | ICD-10-CM

## 2018-02-04 DIAGNOSIS — E119 Type 2 diabetes mellitus without complications: Secondary | ICD-10-CM | POA: Diagnosis not present

## 2018-02-04 DIAGNOSIS — E782 Mixed hyperlipidemia: Secondary | ICD-10-CM

## 2018-02-04 DIAGNOSIS — E039 Hypothyroidism, unspecified: Secondary | ICD-10-CM

## 2018-02-04 DIAGNOSIS — J301 Allergic rhinitis due to pollen: Secondary | ICD-10-CM

## 2018-02-04 DIAGNOSIS — I10 Essential (primary) hypertension: Secondary | ICD-10-CM

## 2018-02-04 DIAGNOSIS — F3341 Major depressive disorder, recurrent, in partial remission: Secondary | ICD-10-CM

## 2018-02-04 DIAGNOSIS — R5381 Other malaise: Secondary | ICD-10-CM | POA: Diagnosis not present

## 2018-02-04 DIAGNOSIS — E785 Hyperlipidemia, unspecified: Secondary | ICD-10-CM | POA: Diagnosis not present

## 2018-02-04 DIAGNOSIS — E876 Hypokalemia: Secondary | ICD-10-CM

## 2018-02-04 DIAGNOSIS — Z23 Encounter for immunization: Secondary | ICD-10-CM | POA: Diagnosis not present

## 2018-02-04 MED ORDER — SIMVASTATIN 20 MG PO TABS: 20.0000 mg | ORAL_TABLET | Freq: Every day | ORAL | 5 refills | Status: DC

## 2018-02-04 MED ORDER — HYDROCHLOROTHIAZIDE 25 MG PO TABS: 25.0000 mg | ORAL_TABLET | Freq: Every day | ORAL | 5 refills | Status: DC

## 2018-02-04 MED ORDER — LEVOTHYROXINE SODIUM 88 MCG PO TABS: ORAL_TABLET | ORAL | 5 refills | Status: DC

## 2018-02-04 MED ORDER — CITALOPRAM HYDROBROMIDE 20 MG PO TABS: 20.0000 mg | ORAL_TABLET | Freq: Every day | ORAL | 5 refills | Status: DC

## 2018-02-04 MED ORDER — POTASSIUM CHLORIDE CRYS ER 20 MEQ PO TBCR: 20.0000 meq | EXTENDED_RELEASE_TABLET | Freq: Three times a day (TID) | ORAL | 5 refills | Status: DC

## 2018-02-04 MED ORDER — FLUTICASONE PROPIONATE 50 MCG/ACT NA SUSP: 2.0000 | Freq: Every day | NASAL | 6 refills | Status: DC

## 2018-02-04 NOTE — Progress Notes (Signed)
Date:  02/04/2018   Name:  Phyllis Wagner   DOB:  20-Jul-1955   MRN:  [id number]   Chief Complaint: Hypertension; Hypothyroidism; Depression; Hyperlipidemia; Flu Vaccine; and Diabetes (NOT TAKING aMARYL) Hypertension  This is a chronic problem. The current episode started more than 1 year ago. The problem is unchanged. The problem is controlled. Associated symptoms include headaches. Pertinent negatives include no anxiety, blurred vision, chest pain, malaise/fatigue, neck pain, orthopnea, palpitations, peripheral edema, PND, shortness of breath or sweats. There are no associated agents to hypertension. Risk factors for coronary artery disease include diabetes mellitus, dyslipidemia and post-menopausal state. Past treatments include diuretics. The current treatment provides moderate improvement. There are no compliance problems.  There is no history of angina, kidney disease, CAD/MI, CVA, heart failure, left ventricular hypertrophy, PVD or retinopathy. Identifiable causes of hypertension include a thyroid problem. There is no history of chronic renal disease, a hypertension causing med or renovascular disease.  Depression         This is a chronic problem.  The current episode started more than 1 year ago.   The onset quality is sudden.   The problem occurs intermittently.  The problem has been gradually worsening since onset.  Associated symptoms include headaches.  Associated symptoms include no decreased concentration, no fatigue, no helplessness, no hopelessness, does not have insomnia, not irritable, no restlessness, no decreased interest, no appetite change, no body aches, no myalgias, no indigestion, not sad and no suicidal ideas.     The symptoms are aggravated by nothing.  Past treatments include SSRIs - Selective serotonin reuptake inhibitors.  Compliance with treatment is good.  Previous treatment provided mild relief.  Past medical history includes hypothyroidism and thyroid  problem.     Pertinent negatives include no anxiety. Hyperlipidemia  This is a chronic problem. Recent lipid tests were reviewed and are normal. Exacerbating diseases include diabetes and hypothyroidism. She has no history of chronic renal disease or liver disease. Factors aggravating her hyperlipidemia include thiazides. Pertinent negatives include no chest pain, focal sensory loss, focal weakness, leg pain, myalgias or shortness of breath. Current antihyperlipidemic treatment includes statins. The current treatment provides moderate improvement of lipids.  Diabetes  She presents for her follow-up diabetic visit. She has type 2 diabetes mellitus. Hypoglycemia symptoms include headaches. Pertinent negatives for hypoglycemia include no confusion, dizziness, hunger, mood changes, nervousness/anxiousness, pallor, seizures, sleepiness, speech difficulty, sweats or tremors. Pertinent negatives for diabetes include no blurred vision, no chest pain, no fatigue, no foot paresthesias, no foot ulcerations, no polydipsia, no polyphagia, no polyuria, no visual change, no weakness and no weight loss. There are no hypoglycemic complications. Symptoms are stable. There are no diabetic complications. Pertinent negatives for diabetic complications include no CVA, heart disease, PVD or retinopathy. Risk factors for coronary artery disease include diabetes mellitus, dyslipidemia, hypertension and female sex. Current diabetic treatment includes diet. She is compliant with treatment most of the time. Her weight is stable. She is following a generally healthy diet. She participates in exercise three times a week. An ACE inhibitor/angiotensin II receptor blocker is not being taken. She does not see a podiatrist.Eye exam is not current.  Thyroid Problem  Presents for follow-up visit. Symptoms include hair loss. Patient reports no anxiety, cold intolerance, constipation, depressed mood, diaphoresis, diarrhea, dry skin, fatigue, heat  intolerance, hoarse voice, leg swelling, menstrual problem, nail problem, palpitations, tremors, visual change, weight gain or weight loss. The symptoms have been stable. Her past medical history is significant  for diabetes and hyperlipidemia. There is no history of heart failure.     Review of Systems  Constitutional: Negative.  Negative for appetite change, chills, diaphoresis, fatigue, fever, malaise/fatigue, unexpected weight change, weight gain and weight loss.  HENT: Negative for congestion, ear discharge, ear pain, hoarse voice, rhinorrhea, sinus pressure, sneezing and sore throat.   Eyes: Negative for blurred vision, photophobia, pain, discharge, redness and itching.  Respiratory: Negative for cough, shortness of breath, wheezing and stridor.   Cardiovascular: Negative for chest pain, palpitations, orthopnea and PND.  Gastrointestinal: Negative for abdominal pain, blood in stool, constipation, diarrhea, nausea and vomiting.  Endocrine: Negative for cold intolerance, heat intolerance, polydipsia, polyphagia and polyuria.  Genitourinary: Negative for dysuria, flank pain, frequency, hematuria, menstrual problem, pelvic pain, urgency, vaginal bleeding and vaginal discharge.  Musculoskeletal: Negative for arthralgias, back pain, myalgias and neck pain.  Skin: Negative for pallor and rash.  Allergic/Immunologic: Negative for environmental allergies and food allergies.  Neurological: Positive for headaches. Negative for dizziness, tremors, focal weakness, seizures, speech difficulty, weakness, light-headedness and numbness.  Hematological: Negative for adenopathy. Does not bruise/bleed easily.  Psychiatric/Behavioral: Positive for depression. Negative for confusion, decreased concentration, dysphoric mood and suicidal ideas. The patient is not nervous/anxious and does not have insomnia.     Patient Active Problem List   Diagnosis Date Noted  . Calculus of gallbladder without cholecystitis  without obstruction 06/12/2017  . Chronic maxillary sinusitis 03/15/2015  . Chronic rhinitis 03/15/2015  . Mild intermittent asthma without complication 03/15/2015  . Oroantral fistula 03/15/2015  . Essential hypertension 01/05/2015  . Thyroid activity decreased 01/05/2015  . Hyperlipidemia 01/05/2015  . Recurrent major depressive disorder, in partial remission (HCC) 01/05/2015    Allergies  Allergen Reactions  . Aspirin Anaphylaxis, Hives and Shortness Of Breath  . Biaxin [Clarithromycin] Anaphylaxis, Hives and Shortness Of Breath  . Nsaids Hives and Swelling  . Penicillins Anaphylaxis, Hives and Swelling    Has patient had a PCN reaction causing immediate rash, facial/tongue/throat swelling, SOB or lightheadedness with hypotension:Yes Has patient had a PCN reaction causing severe rash involving mucus membranes or skin necrosis: Unknown Has patient had a PCN reaction that required hospitalization: Unknown Has patient had a PCN reaction occurring within the last 10 years: No Childhood reaction. If all of the above answers are "NO", then may proceed with Cephalosporin use.   . Sulfa Antibiotics Hives  . Ancef [Cefazolin] Rash    Past Surgical History:  Procedure Laterality Date  . CHOLECYSTECTOMY N/A 06/21/2017   Procedure: LAPAROSCOPIC CHOLECYSTECTOMY WITH INTRAOPERATIVE CHOLANGIOGRAM;  Surgeon: Earline Mayotte, MD;  Location: [facility] ORS;  Service: General;  Laterality: N/A;  . COLONOSCOPY  2012   cleared for 10 yrs- [employer]  . EYE SURGERY Bilateral 2018  . GASTRIC BYPASS  2013   Dr Ethelene Hal at [facility]  . KNEE SURGERY Left   . LYMPH NODE DISSECTION    . TONSILLECTOMY    . UMBILICAL HERNIA REPAIR  06/21/2017   Procedure: HERNIA REPAIR UMBILICAL ADULT;  Surgeon: Earline Mayotte, MD;  Location: [facility] ORS;  Service: General;;  . VAGINAL HYSTERECTOMY     ovaries remain    Social History   Tobacco Use  . Smoking status: Former Smoker    Packs/day: 1.00    Years: 20.00     Pack years: 20.00    Types: Cigarettes    Last attempt to quit: 06/18/2010    Years since quitting: 7.6  . Smokeless tobacco: Never Used  Substance  Use Topics  . Alcohol use: Not Currently    Alcohol/week: 0.0 standard drinks  . Drug use: No     Medication list has been reviewed and updated.  Current Meds  Medication Sig  . acetaminophen (TYLENOL) 500 MG tablet Take 1,000-1,500 mg by mouth every 6 (six) hours as needed (for pain/headaches.).  Marland Kitchen cholecalciferol (VITAMIN D) 1000 UNITS tablet Take 1,000 Units by mouth daily.  . citalopram (CELEXA) 20 MG tablet Take 1 tablet (20 mg total) by mouth daily. (Patient taking differently: Take 20 mg by mouth every morning. )  . citalopram (CELEXA) 20 MG tablet Take 1 tablet (20 mg total) by mouth daily.  . fluticasone (FLONASE) 50 MCG/ACT nasal spray Place 2 sprays into both nostrils daily.  . Glucosamine-Chondroitin (COSAMIN DS PO) Take 2 tablets by mouth daily.  . hydrochlorothiazide (HYDRODIURIL) 25 MG tablet Take 1 tablet (25 mg total) by mouth daily.  Marland Kitchen levothyroxine (SYNTHROID, LEVOTHROID) 88 MCG tablet TAKE 1 TABLET (88 MCG TOTAL) BY MOUTH DAILY BEFORE BREAKFAST. (NEEDS OV AND LABS)  . Lifitegrast (XIIDRA) 5 % SOLN Place 1 drop into both eyes 2 (two) times daily.  . Omega-3 Fatty Acids (FISH OIL) 1000 MG CAPS Take 1,000 mg by mouth daily.   Bertram Gala Glycol-Propyl Glycol (SYSTANE ULTRA) 0.4-0.3 % SOLN Place 1 drop into both eyes 3 (three) times daily as needed (for dry eyes.).  Marland Kitchen potassium chloride SA (K-DUR,KLOR-CON) 20 MEQ tablet Take 1 tablet (20 mEq total) by mouth 3 (three) times daily. Take one dose today, three doses tomorrow and one dose the morning of surgery.  . simvastatin (ZOCOR) 20 MG tablet Take 1 tablet (20 mg total) by mouth daily.  . vitamin B-12 (CYANOCOBALAMIN) 500 MCG tablet Take 500 mcg by mouth daily.  . [DISCONTINUED] citalopram (CELEXA) 20 MG tablet Take 1 tablet (20 mg total) by mouth daily.  . [DISCONTINUED]  fluticasone (FLONASE) 50 MCG/ACT nasal spray Place 2 sprays into both nostrils daily. (Patient taking differently: Place 2 sprays into both nostrils as needed. )  . [DISCONTINUED] hydrochlorothiazide (HYDRODIURIL) 25 MG tablet TAKE 1 TABLET BY MOUTH EVERY DAY  . [DISCONTINUED] levothyroxine (SYNTHROID, LEVOTHROID) 88 MCG tablet TAKE 1 TABLET (88 MCG TOTAL) BY MOUTH DAILY BEFORE BREAKFAST. (NEEDS OV AND LABS)  . [DISCONTINUED] potassium chloride SA (K-DUR,KLOR-CON) 20 MEQ tablet Take 1 tablet (20 mEq total) by mouth 3 (three) times daily. Take one dose today, three doses tomorrow and one dose the morning of surgery.  . [DISCONTINUED] simvastatin (ZOCOR) 20 MG tablet TAKE 1 TABLET BY MOUTH EVERY DAY    PHQ 2/9 Scores 02/04/2018 06/06/2017 09/08/2015  PHQ - 2 Score 0 0 0  PHQ- 9 Score 1 3 -    Physical Exam  Constitutional: She is oriented to person, place, and time. She appears well-developed and well-nourished. She is not irritable.  HENT:  Head: Normocephalic.  Right Ear: External ear normal.  Left Ear: External ear normal.  Mouth/Throat: Oropharynx is clear and moist.  Eyes: Pupils are equal, round, and reactive to light. Conjunctivae and EOM are normal. Lids are everted and swept, no foreign bodies found. Left eye exhibits no hordeolum. No foreign body present in the left eye. Right conjunctiva is not injected. Left conjunctiva is not injected. No scleral icterus.  Neck: Normal range of motion. Neck supple. No JVD present. No tracheal deviation present. No thyromegaly present.  Cardiovascular: Normal rate, regular rhythm, normal heart sounds and intact distal pulses. Exam reveals no gallop and no friction  rub.  No murmur heard. Pulmonary/Chest: Effort normal and breath sounds normal. No respiratory distress. She has no wheezes. She has no rales.  Abdominal: Soft. Bowel sounds are normal. She exhibits no mass. There is no hepatosplenomegaly. There is no tenderness. There is no rebound and no  guarding.  Musculoskeletal: Normal range of motion. She exhibits no edema or tenderness.  Lymphadenopathy:    She has no cervical adenopathy.  Neurological: She is alert and oriented to person, place, and time. She has normal strength. She displays normal reflexes. No cranial nerve deficit.  Skin: Skin is warm. No rash noted.  Psychiatric: She has a normal mood and affect. Her mood appears not anxious. She does not exhibit a depressed mood.  Nursing note and vitals reviewed.   BP 130/80   Pulse 76   Ht 5\' 3"  (1.6 m)   Wt 177 lb (80.3 kg)   BMI 31.35 kg/m   Assessment and Plan:  1. Essential hypertension Stable on meds- refill HCTZ/ draw renal panel - hydrochlorothiazide (HYDRODIURIL) 25 MG tablet; Take 1 tablet (25 mg total) by mouth daily.  Dispense: 30 tablet; Refill: 5 - Renal Function Panel  2. Hyperlipidemia Stable on med- refill Simvastatin/ draw lipid panel - simvastatin (ZOCOR) 20 MG tablet; Take 1 tablet (20 mg total) by mouth daily.  Dispense: 30 tablet; Refill: 5 - Lipid panel  3. Hypothyroidism, unspecified type Stable on med- refill Levothyroxine/ draw TSH - levothyroxine (SYNTHROID, LEVOTHROID) 88 MCG tablet; TAKE 1 TABLET (88 MCG TOTAL) BY MOUTH DAILY BEFORE BREAKFAST. (NEEDS OV AND LABS)  Dispense: 30 tablet; Refill: 5 - TSH  4. Malaise and fatigue Stable on med- refill citalopram - citalopram (CELEXA) 20 MG tablet; Take 1 tablet (20 mg total) by mouth daily.  Dispense: 30 tablet; Refill: 5  5. Recurrent major depressive disorder, in partial remission (HCC) Stable on med- refill citalopram - citalopram (CELEXA) 20 MG tablet; Take 1 tablet (20 mg total) by mouth daily.  Dispense: 30 tablet; Refill: 5  6. Hypokalemia Stable on med- refill potassium chloride - potassium chloride SA (K-DUR,KLOR-CON) 20 MEQ tablet; Take 1 tablet (20 mEq total) by mouth 3 (three) times daily. Take one dose today, three doses tomorrow and one dose the morning of surgery.   Dispense: 30 tablet; Refill: 5  7. Seasonal allergic rhinitis due to pollen Stable on med- refill Flonase use as directed - fluticasone (FLONASE) 50 MCG/ACT nasal spray; Place 2 sprays into both nostrils daily.  Dispense: 16 g; Refill: 6  8. Type 2 diabetes mellitus without complication, without long-term current use of insulin (HCC) Pt stopped Amaryl/ draw A1C and renal panel - Hemoglobin A1c - Renal Function Panel  9. Flu vaccine need Discussed and administered - Flu Vaccine QUAD 6+ mos PF IM (Fluarix Quad PF)   Dr. Elizabeth Sauer [facility]  02/04/2018

## 2018-02-05 ENCOUNTER — Other Ambulatory Visit: Payer: Self-pay

## 2018-02-05 DIAGNOSIS — E039 Hypothyroidism, unspecified: Secondary | ICD-10-CM

## 2018-02-05 LAB — HEMOGLOBIN A1C
Est. average glucose Bld gHb Est-mCnc: 160 mg/dL
Hgb A1c MFr Bld: 7.2 % — ABNORMAL HIGH (ref 4.8–5.6)

## 2018-02-05 LAB — TSH: TSH: 14.97 u[IU]/mL — ABNORMAL HIGH (ref 0.450–4.500)

## 2018-02-05 LAB — RENAL FUNCTION PANEL
Albumin: 4.9 g/dL (ref 3.5–5.5)
BUN/Creatinine Ratio: 26 — ABNORMAL HIGH (ref 9–23)
BUN: 16 mg/dL (ref 6–24)
CO2: 25 mmol/L (ref 20–29)
Calcium: 9.5 mg/dL (ref 8.7–10.2)
Chloride: 96 mmol/L (ref 96–106)
Creatinine, Ser: 0.62 mg/dL (ref 0.57–1.00)
GFR calc Af Amer: 114 mL/min/{1.73_m2} (ref >59)
GFR calc non Af Amer: 99 mL/min/{1.73_m2} (ref >59)
Glucose: 123 mg/dL — ABNORMAL HIGH (ref 65–99)
Phosphorus: 3.9 mg/dL (ref 2.5–4.5)
Potassium: 4 mmol/L (ref 3.5–5.2)
Sodium: 137 mmol/L (ref 134–144)

## 2018-02-05 LAB — LIPID PANEL
Chol/HDL Ratio: 2.5 ratio (ref 0.0–4.4)
Cholesterol, Total: 197 mg/dL (ref 100–199)
HDL: 80 mg/dL (ref >39)
LDL Calculated: 101 mg/dL — ABNORMAL HIGH (ref 0–99)
Triglycerides: 79 mg/dL (ref 0–149)
VLDL Cholesterol Cal: 16 mg/dL (ref 5–40)

## 2018-04-17 ENCOUNTER — Other Ambulatory Visit: Payer: Self-pay | Admitting: Family Medicine

## 2018-04-21 DIAGNOSIS — H04123 Dry eye syndrome of bilateral lacrimal glands: Secondary | ICD-10-CM | POA: Diagnosis not present

## 2018-05-15 ENCOUNTER — Other Ambulatory Visit: Payer: Self-pay | Admitting: Family Medicine

## 2018-05-19 ENCOUNTER — Ambulatory Visit: Payer: Self-pay

## 2018-05-29 ENCOUNTER — Ambulatory Visit: Payer: Self-pay

## 2018-06-03 ENCOUNTER — Ambulatory Visit: Payer: Self-pay | Admitting: Family Medicine

## 2018-06-10 ENCOUNTER — Other Ambulatory Visit: Payer: Self-pay | Admitting: Family Medicine

## 2018-06-11 ENCOUNTER — Other Ambulatory Visit: Payer: Self-pay

## 2018-07-04 ENCOUNTER — Encounter: Payer: Self-pay | Admitting: Family Medicine

## 2018-07-04 ENCOUNTER — Ambulatory Visit (INDEPENDENT_AMBULATORY_CARE_PROVIDER_SITE_OTHER): Payer: BLUE CROSS/BLUE SHIELD | Admitting: Family Medicine

## 2018-07-04 ENCOUNTER — Other Ambulatory Visit: Payer: Self-pay

## 2018-07-04 VITALS — BP 120/62 | HR 80 | Ht 63.0 in | Wt 185.0 lb

## 2018-07-04 DIAGNOSIS — E782 Mixed hyperlipidemia: Secondary | ICD-10-CM

## 2018-07-04 DIAGNOSIS — R5381 Other malaise: Secondary | ICD-10-CM

## 2018-07-04 DIAGNOSIS — E039 Hypothyroidism, unspecified: Secondary | ICD-10-CM

## 2018-07-04 DIAGNOSIS — R69 Illness, unspecified: Secondary | ICD-10-CM | POA: Diagnosis not present

## 2018-07-04 DIAGNOSIS — R7303 Prediabetes: Secondary | ICD-10-CM

## 2018-07-04 DIAGNOSIS — I10 Essential (primary) hypertension: Secondary | ICD-10-CM | POA: Diagnosis not present

## 2018-07-04 DIAGNOSIS — F3341 Major depressive disorder, recurrent, in partial remission: Secondary | ICD-10-CM

## 2018-07-04 DIAGNOSIS — R5383 Other fatigue: Secondary | ICD-10-CM

## 2018-07-04 MED ORDER — CITALOPRAM HYDROBROMIDE 20 MG PO TABS: 20.0000 mg | ORAL_TABLET | Freq: Every day | ORAL | 1 refills | Status: DC

## 2018-07-04 MED ORDER — LEVOTHYROXINE SODIUM 88 MCG PO TABS: ORAL_TABLET | ORAL | 1 refills | Status: DC

## 2018-07-04 MED ORDER — HYDROCHLOROTHIAZIDE 25 MG PO TABS: 25.0000 mg | ORAL_TABLET | Freq: Every day | ORAL | 1 refills | Status: DC

## 2018-07-04 MED ORDER — SIMVASTATIN 20 MG PO TABS: 20.0000 mg | ORAL_TABLET | Freq: Every day | ORAL | 1 refills | Status: DC

## 2018-07-04 MED ORDER — GLIMEPIRIDE 1 MG PO TABS: ORAL_TABLET | ORAL | 1 refills | Status: DC

## 2018-07-04 NOTE — Progress Notes (Signed)
Date:  07/04/2018   Name:  Phyllis Wagner   DOB:  [birth year]   MRN:  [id number]   Chief Complaint: Hypertension; Hypothyroidism; Hyperlipidemia; Diabetes; and Depression (PHQ9=2)  Hypertension  This is a chronic problem. The current episode started more than 1 year ago. The problem has been gradually worsening since onset. The problem is controlled. Pertinent negatives include no anxiety, blurred vision, chest pain, headaches, malaise/fatigue, neck pain, orthopnea, palpitations, peripheral edema, PND, shortness of breath or sweats. There are no associated agents to hypertension. Risk factors for coronary artery disease include diabetes mellitus, dyslipidemia and obesity. Past treatments include diuretics. The current treatment provides moderate improvement. There are no compliance problems.  There is no history of angina, kidney disease, CAD/MI, CVA, heart failure, left ventricular hypertrophy or retinopathy. Identifiable causes of hypertension include a thyroid problem. There is no history of chronic renal disease, a hypertension causing med or renovascular disease.  Hyperlipidemia  This is a chronic problem. The current episode started more than 1 year ago. The problem is controlled. Recent lipid tests were reviewed and are normal. She has no history of chronic renal disease, diabetes, hypothyroidism, liver disease, obesity or nephrotic syndrome. Factors aggravating her hyperlipidemia include thiazides. Pertinent negatives include no chest pain, focal sensory loss, focal weakness, leg pain, myalgias or shortness of breath. Current antihyperlipidemic treatment includes statins. The current treatment provides moderate improvement of lipids. There are no compliance problems.  Risk factors for coronary artery disease include dyslipidemia, diabetes mellitus, hypertension, female sex and post-menopausal.  Diabetes  She presents for her follow-up diabetic visit. She has type 2 diabetes mellitus.  Her disease course has been stable. There are no hypoglycemic associated symptoms. Pertinent negatives for hypoglycemia include no dizziness, headaches, nervousness/anxiousness, sweats or tremors. Pertinent negatives for diabetes include no blurred vision, no chest pain, no fatigue, no foot paresthesias, no foot ulcerations, no polydipsia, no polyphagia, no polyuria, no visual change, no weakness and no weight loss. There are no hypoglycemic complications. Symptoms are stable. There are no diabetic complications. Pertinent negatives for diabetic complications include no autonomic neuropathy, CVA, heart disease, impotence, nephropathy, peripheral neuropathy or retinopathy. Risk factors for coronary artery disease include diabetes mellitus and dyslipidemia. Current diabetic treatment includes oral agent (monotherapy). She is compliant with treatment most of the time. Meal planning includes avoidance of concentrated sweets and carbohydrate counting. She participates in exercise intermittently. There is no change in her home blood glucose trend. Her breakfast blood glucose is taken between 8-9 am. Her breakfast blood glucose range is generally 110-130 mg/dl. She does not see a podiatrist.Eye exam is not current.  Depression         This is a chronic (PHQ 2) problem.  The current episode started more than 1 year ago.   The problem occurs intermittently.  The problem has been gradually improving since onset.  Associated symptoms include no decreased concentration, no fatigue, no helplessness, no hopelessness, no appetite change, no body aches, no myalgias, no headaches and not sad.     The symptoms are aggravated by work stress.  Past treatments include SSRIs - Selective serotonin reuptake inhibitors.  Compliance with treatment is good.  Past compliance problems include medical issues.  Previous treatment provided moderate relief.  Past medical history includes thyroid problem.     Pertinent negatives include no  hypothyroidism and no anxiety. Thyroid Problem  Presents for follow-up visit. Patient reports no anxiety, cold intolerance, constipation, depressed mood, diaphoresis, diarrhea, dry skin, fatigue, hair loss,  heat intolerance, hoarse voice, leg swelling, menstrual problem, nail problem, palpitations, tremors, visual change, weight gain or weight loss. (HYSTERECTOMY) The symptoms have been stable. Her past medical history is significant for hyperlipidemia. There is no history of diabetes or heart failure.    Review of Systems  Constitutional: Negative.  Negative for appetite change, chills, diaphoresis, fatigue, fever, malaise/fatigue, unexpected weight change, weight gain and weight loss.  HENT: Negative for congestion, ear discharge, ear pain, hoarse voice, rhinorrhea, sinus pressure, sneezing and sore throat.   Eyes: Negative for blurred vision, photophobia, pain, discharge, redness and itching.  Respiratory: Negative for cough, shortness of breath, wheezing and stridor.   Cardiovascular: Negative for chest pain, palpitations, orthopnea and PND.  Gastrointestinal: Negative for abdominal pain, blood in stool, constipation, diarrhea, nausea and vomiting.  Endocrine: Negative for cold intolerance, heat intolerance, polydipsia, polyphagia and polyuria.  Genitourinary: Negative for dysuria, flank pain, frequency, hematuria, impotence, menstrual problem, pelvic pain, urgency, vaginal bleeding and vaginal discharge.  Musculoskeletal: Negative for arthralgias, back pain, myalgias and neck pain.  Skin: Negative for rash.  Allergic/Immunologic: Negative for environmental allergies and food allergies.  Neurological: Negative for dizziness, tremors, focal weakness, weakness, light-headedness, numbness and headaches.  Hematological: Negative for adenopathy. Does not bruise/bleed easily.  Psychiatric/Behavioral: Positive for depression. Negative for decreased concentration and dysphoric mood. The patient is not  nervous/anxious.     Patient Active Problem List   Diagnosis Date Noted   Calculus of gallbladder without cholecystitis without obstruction 06/12/2017   Chronic maxillary sinusitis 03/15/2015   Chronic rhinitis 03/15/2015   Mild intermittent asthma without complication 03/15/2015   Oroantral fistula 03/15/2015   Essential hypertension 01/05/2015   Thyroid activity decreased 01/05/2015   Hyperlipidemia 01/05/2015   Recurrent major depressive disorder, in partial remission (HCC) 01/05/2015    Allergies  Allergen Reactions   Aspirin Anaphylaxis, Hives and Shortness Of Breath   Biaxin [Clarithromycin] Anaphylaxis, Hives and Shortness Of Breath   Nsaids Hives and Swelling   Penicillins Anaphylaxis, Hives and Swelling    Has patient had a PCN reaction causing immediate rash, facial/tongue/throat swelling, SOB or lightheadedness with hypotension:Yes Has patient had a PCN reaction causing severe rash involving mucus membranes or skin necrosis: Unknown Has patient had a PCN reaction that required hospitalization: Unknown Has patient had a PCN reaction occurring within the last 10 years: No Childhood reaction. If all of the above answers are "NO", then may proceed with Cephalosporin use.    Sulfa Antibiotics Hives   Ancef [Cefazolin] Rash    Past Surgical History:  Procedure Laterality Date   CHOLECYSTECTOMY N/A 06/21/2017   Procedure: LAPAROSCOPIC CHOLECYSTECTOMY WITH INTRAOPERATIVE CHOLANGIOGRAM;  Surgeon: Earline MayotteByrnett, Jeffrey W, MD;  Location: [facility] OR[facility];  Service: General;  Laterality: N/A;   COLONOSCOPY  2012   cleared for 10 yrs- [place] [facility]   EYE SURGERY Bilateral 2018   [facility]   KNEE SURGERY Left    LYMPH NODE DISSECTION     TONSILLECTOMY     UMBILICAL HERNIA REPAIR  06/21/2017   Procedure: HERNIA REPAIR UMBILICAL ADULT;  Surgeon: Earline MayotteByrnett, Jeffrey W, MD;  Location: [facility] OR[facility];  Service: General;;   VAGINAL HYSTERECTOMY      ovaries remain    Social History   Tobacco Use   Smoking status: Former Smoker    Packs/day: 1.00    Years: 20.00    Pack years: 20.00    Types: Cigarettes    Last attempt to quit:  06/18/2010    Years since quitting: 8.0   Smokeless tobacco: Never Used  Substance Use Topics   Alcohol use: Not Currently    Alcohol/week: 0.0 standard drinks   Drug use: No     Medication list has been reviewed and updated.  Current Meds  Medication Sig   acetaminophen (TYLENOL) 500 MG tablet Take 1,000-1,500 mg by mouth every 6 (six) hours as needed (for pain/headaches.).   ascorbic acid (VITAMIN C) 250 MG CHEW Chew 250 mg by mouth daily.   cholecalciferol (VITAMIN D) 1000 UNITS tablet Take 1,000 Units by mouth daily.   citalopram (CELEXA) 20 MG tablet Take 1 tablet (20 mg total) by mouth daily. (Patient taking differently: Take 20 mg by mouth every morning. )   fluticasone (FLONASE) 50 MCG/ACT nasal spray Place 2 sprays into both nostrils daily.   glimepiride (AMARYL) 1 MG tablet TAKE 1 TABLET (1 MG TOTAL) BY MOUTH DAILY WITH BREAKFAST. (NEED APPT FOR FOLLOW UP AND LABS) (Patient taking differently: Half tablet daily)   Glucosamine-Chondroitin (COSAMIN DS PO) Take 2 tablets by mouth daily.   hydrochlorothiazide (HYDRODIURIL) 25 MG tablet Take 1 tablet (25 mg total) by mouth daily.   levothyroxine (SYNTHROID, LEVOTHROID) 88 MCG tablet TAKE 1 TABLET (88 MCG TOTAL) BY MOUTH DAILY BEFORE BREAKFAST. (NEEDS OV AND LABS)   Omega-3 Fatty Acids (FISH OIL) 1000 MG CAPS Take 1,000 mg by mouth daily.    Polyethyl Glycol-Propyl Glycol (SYSTANE ULTRA) 0.4-0.3 % SOLN Place 1 drop into both eyes 3 (three) times daily as needed (for dry eyes.).   Potassium 99 MG TABS Take 2 tablets by mouth daily. otc   simvastatin (ZOCOR) 20 MG tablet Take 1 tablet (20 mg total) by mouth daily.   vitamin B-12 (CYANOCOBALAMIN) 500 MCG tablet Take 500 mcg by mouth daily.   [DISCONTINUED] citalopram (CELEXA) 20 MG  tablet Take 1 tablet (20 mg total) by mouth daily.    PHQ 2/9 Scores 07/04/2018 02/04/2018 06/06/2017 09/08/2015  PHQ - 2 Score 0 0 0 0  PHQ- 9 Score 2 1 3  -    BP Readings from Last 3 Encounters:  07/04/18 120/62  02/04/18 130/80  07/02/17 130/80    Physical Exam Vitals signs and nursing note reviewed.  Constitutional:      General: She is not in acute distress.    Appearance: She is not diaphoretic.  HENT:     Head: Normocephalic and atraumatic.     Right Ear: External ear normal.     Left Ear: External ear normal.     Nose: Nose normal.     Mouth/Throat:     Mouth: Mucous membranes are moist.  Eyes:     General:        Right eye: No discharge.        Left eye: No discharge.     Conjunctiva/sclera: Conjunctivae normal.     Pupils: Pupils are equal, round, and reactive to light.  Neck:     Musculoskeletal: Normal range of motion and neck supple.     Thyroid: No thyromegaly.     Vascular: No JVD.  Cardiovascular:     Rate and Rhythm: Normal rate and regular rhythm.     Heart sounds: Normal heart sounds. No murmur. No friction rub. No gallop.   Pulmonary:     Effort: Pulmonary effort is normal.     Breath sounds: Normal breath sounds.  Abdominal:     General: Bowel sounds are normal.     Palpations:  Abdomen is soft. There is no mass.     Tenderness: There is no abdominal tenderness. There is no guarding.  Musculoskeletal: Normal range of motion.  Lymphadenopathy:     Cervical: No cervical adenopathy.  Skin:    General: Skin is warm and dry.     Capillary Refill: Capillary refill takes less than 2 seconds.  Neurological:     Mental Status: She is alert.     Deep Tendon Reflexes: Reflexes are normal and symmetric.     Wt Readings from Last 3 Encounters:  07/04/18 185 lb (83.9 kg)  02/04/18 177 lb (80.3 kg)  07/02/17 181 lb (82.1 kg)    BP 120/62    Pulse 80    Ht  (1.6 m)    Wt 185 lb (83.9 kg)    BMI 32.77 kg/m   Assessment and Plan:  1. Malaise  and fatigue Patient with a history of depression for which she is taking citalopram 20 mg once a day.  We will continue this regimen PHQ was noted to be 2 - citalopram (CELEXA) 20 MG tablet; Take 1 tablet (20 mg total) by mouth daily.  Dispense: 90 tablet; Refill: 1  2. Recurrent major depressive disorder, in partial remission (HCC) As noted PHQ is 2 and patient is tolerating citalopram 10 mg once a day - citalopram (CELEXA) 20 MG tablet; Take 1 tablet (20 mg total) by mouth daily.  Dispense: 90 tablet; Refill: 1  3. Essential hypertension Chronic controlled continue hydrochlorothiazide 25 mg once a day recheck renal function panel. - Renal Function Panel - hydrochlorothiazide (HYDRODIURIL) 25 MG tablet; Take 1 tablet (25 mg total) by mouth daily.  Dispense: 90 tablet; Refill: 1  4. Hypothyroidism, unspecified type Chronic controlled last TSH was elevated because patient was not taking medication patient is currently back on 88 mcg levothyroxine and will check TSH today - TSH - levothyroxine (SYNTHROID) 88 MCG tablet; TAKE 1 TABLET (88 MCG TOTAL) BY MOUTH DAILY BEFORE BREAKFAST. (NEEDS OV AND LABS)  Dispense: 90 tablet; Refill: 1  5. Mixed hyperlipidemia  Conti simvastatin 20 mg once a day will check lipid panel nue - Lipid Panel With LDL/HDL Ratio - simvastatin (ZOCOR) 20 MG tablet; Take 1 tablet (20 mg total) by mouth daily.  Dispense: 90 tablet; Refill: 1  6. Prediabetes Chronic controlled continue Amaryl 1 mg a day will check microalbuminuria and hemoglobin A1c. - Microalbumin, urine - HgB A1c  7. Taking medication for chronic disease Currently on statin and will check hepatic function panel to evaluate for hepatotoxicity. - Hepatic function panel

## 2018-07-05 LAB — RENAL FUNCTION PANEL
Albumin: 4.5 g/dL (ref 3.8–4.9)
BUN/Creatinine Ratio: 15 (ref 12–28)
BUN: 11 mg/dL (ref 8–27)
CO2: 25 mmol/L (ref 20–29)
Calcium: 9.8 mg/dL (ref 8.7–10.3)
Chloride: 100 mmol/L (ref 96–106)
Creatinine, Ser: 0.71 mg/dL (ref 0.57–1.00)
GFR calc Af Amer: 107 mL/min/{1.73_m2} (ref >59)
GFR calc non Af Amer: 93 mL/min/{1.73_m2} (ref >59)
Glucose: 131 mg/dL — ABNORMAL HIGH (ref 65–99)
Phosphorus: 3.8 mg/dL (ref 3.0–4.3)
Potassium: 4.3 mmol/L (ref 3.5–5.2)
Sodium: 140 mmol/L (ref 134–144)

## 2018-07-05 LAB — HEPATIC FUNCTION PANEL
ALT: 31 IU/L (ref 0–32)
AST: 35 IU/L (ref 0–40)
Alkaline Phosphatase: 79 IU/L (ref 39–117)
Bilirubin Total: 0.3 mg/dL (ref 0.0–1.2)
Bilirubin, Direct: 0.06 mg/dL (ref 0.00–0.40)
Total Protein: 7.9 g/dL (ref 6.0–8.5)

## 2018-07-05 LAB — LIPID PANEL WITH LDL/HDL RATIO
Cholesterol, Total: 190 mg/dL (ref 100–199)
HDL: 71 mg/dL (ref >39)
LDL Calculated: 96 mg/dL (ref 0–99)
LDl/HDL Ratio: 1.4 ratio (ref 0.0–3.2)
Triglycerides: 116 mg/dL (ref 0–149)
VLDL Cholesterol Cal: 23 mg/dL (ref 5–40)

## 2018-07-05 LAB — HEMOGLOBIN A1C
Est. average glucose Bld gHb Est-mCnc: 151 mg/dL
Hgb A1c MFr Bld: 6.9 % — ABNORMAL HIGH (ref 4.8–5.6)

## 2018-07-05 LAB — MICROALBUMIN, URINE: Microalbumin, Urine: 20.6 ug/mL

## 2018-07-05 LAB — TSH: TSH: 0.65 u[IU]/mL (ref 0.450–4.500)

## 2018-09-08 DIAGNOSIS — M2012 Hallux valgus (acquired), left foot: Secondary | ICD-10-CM | POA: Diagnosis not present

## 2018-09-08 DIAGNOSIS — M2011 Hallux valgus (acquired), right foot: Secondary | ICD-10-CM | POA: Diagnosis not present

## 2019-01-06 ENCOUNTER — Other Ambulatory Visit: Payer: Self-pay | Admitting: Family Medicine

## 2019-01-12 ENCOUNTER — Ambulatory Visit: Payer: Self-pay | Admitting: Family Medicine

## 2019-01-14 ENCOUNTER — Other Ambulatory Visit: Payer: Self-pay

## 2019-01-14 ENCOUNTER — Encounter: Payer: Self-pay | Admitting: Family Medicine

## 2019-01-14 ENCOUNTER — Ambulatory Visit (INDEPENDENT_AMBULATORY_CARE_PROVIDER_SITE_OTHER): Payer: BC Managed Care – PPO | Admitting: Family Medicine

## 2019-01-14 VITALS — BP 120/70 | HR 64 | Ht 63.0 in | Wt 185.0 lb

## 2019-01-14 DIAGNOSIS — E782 Mixed hyperlipidemia: Secondary | ICD-10-CM | POA: Diagnosis not present

## 2019-01-14 DIAGNOSIS — J301 Allergic rhinitis due to pollen: Secondary | ICD-10-CM

## 2019-01-14 DIAGNOSIS — I10 Essential (primary) hypertension: Secondary | ICD-10-CM

## 2019-01-14 DIAGNOSIS — F3341 Major depressive disorder, recurrent, in partial remission: Secondary | ICD-10-CM

## 2019-01-14 DIAGNOSIS — R5381 Other malaise: Secondary | ICD-10-CM | POA: Diagnosis not present

## 2019-01-14 DIAGNOSIS — Z23 Encounter for immunization: Secondary | ICD-10-CM | POA: Diagnosis not present

## 2019-01-14 DIAGNOSIS — E039 Hypothyroidism, unspecified: Secondary | ICD-10-CM

## 2019-01-14 DIAGNOSIS — R5383 Other fatigue: Secondary | ICD-10-CM

## 2019-01-14 DIAGNOSIS — H6983 Other specified disorders of Eustachian tube, bilateral: Secondary | ICD-10-CM

## 2019-01-14 DIAGNOSIS — R7303 Prediabetes: Secondary | ICD-10-CM

## 2019-01-14 DIAGNOSIS — H6993 Unspecified Eustachian tube disorder, bilateral: Secondary | ICD-10-CM

## 2019-01-14 MED ORDER — LEVOTHYROXINE SODIUM 88 MCG PO TABS: ORAL_TABLET | ORAL | 1 refills | Status: DC

## 2019-01-14 MED ORDER — METFORMIN HCL ER 500 MG PO TB24: 500.0000 mg | ORAL_TABLET | Freq: Every day | ORAL | 1 refills | Status: DC

## 2019-01-14 MED ORDER — GLIMEPIRIDE 1 MG PO TABS: ORAL_TABLET | ORAL | 0 refills | Status: DC

## 2019-01-14 MED ORDER — FLUTICASONE PROPIONATE 50 MCG/ACT NA SUSP: 2.0000 | Freq: Every day | NASAL | 11 refills | Status: DC

## 2019-01-14 MED ORDER — CITALOPRAM HYDROBROMIDE 20 MG PO TABS: 20.0000 mg | ORAL_TABLET | Freq: Every day | ORAL | 1 refills | Status: DC

## 2019-01-14 MED ORDER — HYDROCHLOROTHIAZIDE 25 MG PO TABS: 25.0000 mg | ORAL_TABLET | Freq: Every day | ORAL | 1 refills | Status: DC

## 2019-01-14 MED ORDER — SIMVASTATIN 20 MG PO TABS: 20.0000 mg | ORAL_TABLET | Freq: Every day | ORAL | 1 refills | Status: DC

## 2019-01-14 NOTE — Progress Notes (Signed)
Date:  01/14/2019   Name:  Phyllis Wagner   DOB:  January 29, 1956   MRN:  [id number]   Chief Complaint: Depression, Diabetes, Allergic Rhinitis , Hypertension, Hyperlipidemia, and Hypothyroidism  Depression        This is a chronic problem.  The current episode started more than 1 year ago.   The onset quality is sudden.   The problem occurs intermittently.  The problem has been gradually improving since onset.  Associated symptoms include fatigue.  Associated symptoms include no decreased concentration, no helplessness, no hopelessness, does not have insomnia, not irritable, no restlessness, no decreased interest, no appetite change, no body aches, no myalgias, no headaches, no indigestion, not sad and no suicidal ideas.  Past treatments include SSRIs - Selective serotonin reuptake inhibitors.  Compliance with treatment is good.  Previous treatment provided moderate relief.  Past medical history includes thyroid problem.     Pertinent negatives include no anxiety. Diabetes She presents for her follow-up diabetic visit. She has type 2 diabetes mellitus. Her disease course has been stable. Pertinent negatives for hypoglycemia include no confusion, dizziness, headaches, hunger, mood changes, nervousness/anxiousness, pallor, seizures, sleepiness, speech difficulty, sweats or tremors. Associated symptoms include fatigue. Pertinent negatives for diabetes include no blurred vision, no chest pain, no foot paresthesias, no foot ulcerations, no polydipsia, no polyphagia, no polyuria, no visual change, no weakness and no weight loss. Symptoms are stable. Pertinent negatives for diabetic complications include no CVA, PVD or retinopathy. There are no known risk factors for coronary artery disease. Current diabetic treatment includes oral agent (monotherapy) (amaryl). She is compliant with treatment most of the time. Her weight is stable. She is following a generally unhealthy (skips meal) diet. Her breakfast  blood glucose range is generally 70-90 mg/dl.  Hypertension This is a chronic problem. The current episode started in the past 7 days. The problem is controlled. Pertinent negatives include no anxiety, blurred vision, chest pain, headaches, malaise/fatigue, neck pain, orthopnea, palpitations, peripheral edema, PND, shortness of breath or sweats. Risk factors for coronary artery disease include dyslipidemia and diabetes mellitus. Past treatments include diuretics. The current treatment provides moderate improvement. There are no compliance problems.  There is no history of angina, kidney disease, CAD/MI, CVA, heart failure, left ventricular hypertrophy, PVD or retinopathy. Identifiable causes of hypertension include a thyroid problem. There is no history of chronic renal disease, a hypertension causing med or renovascular disease.  Hyperlipidemia This is a chronic problem. The problem is controlled. Recent lipid tests were reviewed and are normal. She has no history of chronic renal disease. Factors aggravating her hyperlipidemia include thiazides. Pertinent negatives include no chest pain, myalgias or shortness of breath. Current antihyperlipidemic treatment includes statins. The current treatment provides mild improvement of lipids. There are no compliance problems.   Thyroid Problem Presents for follow-up visit. Symptoms include fatigue. Patient reports no anxiety, constipation, depressed mood, diaphoresis, diarrhea, hair loss, palpitations, tremors, visual change or weight loss. (Pica) The symptoms have been stable. Her past medical history is significant for hyperlipidemia. There is no history of heart failure.  Otalgia  There is pain in the right ear. This is a new problem. The current episode started in the past 7 days. The pain is moderate. Pertinent negatives include no abdominal pain, coughing, diarrhea, ear discharge, headaches, hearing loss, neck pain, rash, rhinorrhea, sore throat or vomiting.  She has tried NSAIDs for the symptoms. The treatment provided mild relief.    Review of Systems  Constitutional: Positive for  fatigue. Negative for appetite change, chills, diaphoresis, fever, malaise/fatigue and weight loss.  HENT: Positive for ear pain. Negative for drooling, ear discharge, hearing loss, rhinorrhea and sore throat.   Eyes: Negative for blurred vision.  Respiratory: Negative for cough, shortness of breath and wheezing.   Cardiovascular: Negative for chest pain, palpitations, orthopnea, leg swelling and PND.  Gastrointestinal: Negative for abdominal pain, blood in stool, constipation, diarrhea, nausea and vomiting.  Endocrine: Negative for polydipsia, polyphagia and polyuria.  Genitourinary: Negative for dysuria, frequency, hematuria and urgency.  Musculoskeletal: Negative for back pain, myalgias and neck pain.  Skin: Negative for pallor and rash.  Allergic/Immunologic: Negative for environmental allergies.  Neurological: Negative for dizziness, tremors, seizures, speech difficulty, weakness and headaches.  Hematological: Does not bruise/bleed easily.  Psychiatric/Behavioral: Positive for depression. Negative for confusion, decreased concentration and suicidal ideas. The patient is not nervous/anxious and does not have insomnia.     Patient Active Problem List   Diagnosis Date Noted  . Calculus of gallbladder without cholecystitis without obstruction 06/12/2017  . Chronic maxillary sinusitis 03/15/2015  . Chronic rhinitis 03/15/2015  . Mild intermittent asthma without complication 03/15/2015  . Oroantral fistula 03/15/2015  . Essential hypertension 01/05/2015  . Thyroid activity decreased 01/05/2015  . Hyperlipidemia 01/05/2015  . Recurrent major depressive disorder, in partial remission (HCC) 01/05/2015    Allergies  Allergen Reactions  . Aspirin Anaphylaxis, Hives and Shortness Of Breath  . Biaxin [Clarithromycin] Anaphylaxis, Hives and Shortness Of Breath  .  Nsaids Hives and Swelling  . Penicillins Anaphylaxis, Hives and Swelling    Has patient had a PCN reaction causing immediate rash, facial/tongue/throat swelling, SOB or lightheadedness with hypotension:Yes Has patient had a PCN reaction causing severe rash involving mucus membranes or skin necrosis: Unknown Has patient had a PCN reaction that required hospitalization: Unknown Has patient had a PCN reaction occurring within the last 10 years: No Childhood reaction. If all of the above answers are "NO", then may proceed with Cephalosporin use.   . Sulfa Antibiotics Hives  . Ancef [Cefazolin] Rash    Past Surgical History:  Procedure Laterality Date  . CHOLECYSTECTOMY N/A 06/21/2017   Procedure: LAPAROSCOPIC CHOLECYSTECTOMY WITH INTRAOPERATIVE CHOLANGIOGRAM;  Surgeon: Earline Mayotte, MD;  Location: [facility] ORS;  Service: General;  Laterality: N/A;  . COLONOSCOPY  2012   cleared for 10 yrs- [employer]  . EYE SURGERY Bilateral 2018  . GASTRIC BYPASS  2013   Dr Ethelene Hal at [facility]  . KNEE SURGERY Left   . LYMPH NODE DISSECTION    . TONSILLECTOMY    . UMBILICAL HERNIA REPAIR  06/21/2017   Procedure: HERNIA REPAIR UMBILICAL ADULT;  Surgeon: Earline Mayotte, MD;  Location: [facility] ORS;  Service: General;;  . VAGINAL HYSTERECTOMY     ovaries remain    Social History   Tobacco Use  . Smoking status: Former Smoker    Packs/day: 1.00    Years: 20.00    Pack years: 20.00    Types: Cigarettes    Quit date: 06/18/2010    Years since quitting: 8.5  . Smokeless tobacco: Never Used  Substance Use Topics  . Alcohol use: Not Currently    Alcohol/week: 0.0 standard drinks  . Drug use: No     Medication list has been reviewed and updated.  Current Meds  Medication Sig  . ascorbic acid (VITAMIN C) 250 MG CHEW Chew 250 mg by mouth daily.  . cholecalciferol (VITAMIN D) 1000 UNITS tablet Take 1,000 Units by mouth  daily.  Marland Kitchen CINNAMON PO Take 8,000 mg by mouth daily.  . citalopram (CELEXA) 20 MG  tablet Take 1 tablet (20 mg total) by mouth daily.  . fluticasone (FLONASE) 50 MCG/ACT nasal spray Place 2 sprays into both nostrils daily.  . Glucosamine-Chondroitin (COSAMIN DS PO) Take 2 tablets by mouth daily.  . hydrochlorothiazide (HYDRODIURIL) 25 MG tablet Take 1 tablet (25 mg total) by mouth daily.  Marland Kitchen levothyroxine (SYNTHROID) 88 MCG tablet TAKE 1 TABLET (88 MCG TOTAL) BY MOUTH DAILY BEFORE BREAKFAST. (NEEDS OV AND LABS)  . Omega-3 Fatty Acids (FISH OIL) 1000 MG CAPS Take 1,000 mg by mouth daily.   Bertram Gala Glycol-Propyl Glycol (SYSTANE ULTRA) 0.4-0.3 % SOLN Place 1 drop into both eyes 3 (three) times daily as needed (for dry eyes.).  Marland Kitchen simvastatin (ZOCOR) 20 MG tablet Take 1 tablet (20 mg total) by mouth daily.    PHQ 2/9 Scores 01/14/2019 07/04/2018 02/04/2018 06/06/2017  PHQ - 2 Score 0 0 0 0  PHQ- 9 Score 4 2 1 3     BP Readings from Last 3 Encounters:  01/14/19 120/70  07/04/18 120/62  02/04/18 130/80    Physical Exam Vitals signs and nursing note reviewed.  Constitutional:      General: She is not irritable.    Appearance: She is well-developed.  HENT:     Head: Normocephalic.     Jaw: There is normal jaw occlusion.     Right Ear: External ear normal. Tympanic membrane is retracted.     Left Ear: External ear normal. Tympanic membrane is retracted.  Eyes:     General: Lids are everted, no foreign bodies appreciated. No scleral icterus.       Left eye: No foreign body or hordeolum.     Conjunctiva/sclera: Conjunctivae normal.     Right eye: Right conjunctiva is not injected.     Left eye: Left conjunctiva is not injected.     Pupils: Pupils are equal, round, and reactive to light.  Neck:     Musculoskeletal: Normal range of motion and neck supple.     Thyroid: No thyromegaly.     Vascular: No JVD.     Trachea: No tracheal deviation.  Cardiovascular:     Rate and Rhythm: Normal rate and regular rhythm.     Heart sounds: Normal heart sounds, S1 normal and S2  normal. No murmur. No systolic murmur. No diastolic murmur. No friction rub. No gallop. No S3 or S4 sounds.   Pulmonary:     Effort: Pulmonary effort is normal. No respiratory distress.     Breath sounds: Normal breath sounds. No wheezing or rales.  Abdominal:     General: Bowel sounds are normal.     Palpations: Abdomen is soft. There is no mass.     Tenderness: There is no abdominal tenderness. There is no guarding or rebound.  Musculoskeletal: Normal range of motion.        General: No tenderness.     Right lower leg: No edema.     Left lower leg: No edema.  Lymphadenopathy:     Cervical: No cervical adenopathy.  Skin:    General: Skin is warm.     Findings: No rash.  Neurological:     Mental Status: She is alert and oriented to person, place, and time.     Cranial Nerves: No cranial nerve deficit.     Deep Tendon Reflexes: Reflexes normal.  Psychiatric:        Mood and  Affect: Mood is not anxious or depressed.     Wt Readings from Last 3 Encounters:  01/14/19 185 lb (83.9 kg)  07/04/18 185 lb (83.9 kg)  02/04/18 177 lb (80.3 kg)    BP 120/70   Pulse 64   Ht 5\' 3"  (1.6 m)   Wt 185 lb (83.9 kg)   BMI 32.77 kg/m   Assessment and Plan: 1. Malaise and fatigue Patient with some persistence of malaise and fatigue.  PHQ was noted to be a 4 with primarily fatigue as the concern.  We will check a CBC to rule out any anemia in the meantime we will continue to monitor for diabetes with A1c. - HgB A1c - citalopram (CELEXA) 20 MG tablet; Take 1 tablet (20 mg total) by mouth daily.  Dispense: 90 tablet; Refill: 1 - CBC  2. Recurrent major depressive disorder, in partial remission (HCC) PHQ 4 chronic.  Controlled.  Stable.  Continue citalopram 20 mg once a day.  Will recheck in 6 months - citalopram (CELEXA) 20 MG tablet; Take 1 tablet (20 mg total) by mouth daily.  Dispense: 90 tablet; Refill: 1  3. Essential hypertension Chronic.  Controlled.  Stable.  We will continue  hydrochlorothiazide 25 mg once a day.  Will check renal function panel.  Will recheck in 6 months. - Renal Function Panel - hydrochlorothiazide (HYDRODIURIL) 25 MG tablet; Take 1 tablet (25 mg total) by mouth daily.  Dispense: 90 tablet; Refill: 1  4. Hypothyroidism, unspecified type Chronic.  Controlled.  Stable.  Will continue levothyroxine 88 mcg daily.  Will check thyroid panel with TSH. - Thyroid Panel With TSH - levothyroxine (SYNTHROID) 88 MCG tablet; TAKE 1 TABLET (88 MCG TOTAL) BY MOUTH DAILY BEFORE BREAKFAST. (NEEDS OV AND LABS)  Dispense: 90 tablet; Refill: 1  5. Mixed hyperlipidemia Chronic.  Controlled.  Stable.  Continue simvastatin 20 mg once a day.  Will check lipid panel. - Lipid Panel With LDL/HDL Ratio - simvastatin (ZOCOR) 20 MG tablet; Take 1 tablet (20 mg total) by mouth daily.  Dispense: 90 tablet; Refill: 1  6. Seasonal allergic rhinitis due to pollen Patient's been having some issues with seasonal allergies patient will resume her Flonase 2 sprays in both nostrils daily - fluticasone (FLONASE) 50 MCG/ACT nasal spray; Place 2 sprays into both nostrils daily.  Dispense: 16 g; Refill: 11  7. Eustachian tube dysfunction, bilateral Patient's had discomfort and her left ear.  It was noted to have bilateral eustachian tube dysfunction indicated by retracted tympanic membranes.  Flonase will be initiated.  Have suggested over-the-counter Sudafed.  8. Prediabetes Patient has had in the 6.8-7 range on her A1c's but has not been taking her Amaryl on a regular basis due to a drop in her sugar too much.  On review of her inability to take Metformin is because it tears her stomach/likely diarrhea.  Patient was called me on 500 mg sustained release to see how she tolerates this and she will stop her Amaryl.  Upon review of her A1c will determine when we will next check that. - metFORMIN (GLUCOPHAGE-XR) 500 MG 24 hr tablet; Take 1 tablet (500 mg total) by mouth daily with breakfast.   Dispense: 30 tablet; Refill: 1  9. Need for immunization against influenza Discussed and administered. - Flu Vaccine QUAD 36+ mos IM

## 2019-01-15 ENCOUNTER — Other Ambulatory Visit: Payer: Self-pay

## 2019-01-15 DIAGNOSIS — E039 Hypothyroidism, unspecified: Secondary | ICD-10-CM

## 2019-01-15 LAB — HEMOGLOBIN A1C
Est. average glucose Bld gHb Est-mCnc: 157 mg/dL
Hgb A1c MFr Bld: 7.1 % — ABNORMAL HIGH (ref 4.8–5.6)

## 2019-01-15 LAB — THYROID PANEL WITH TSH
Free Thyroxine Index: 2.4 (ref 1.2–4.9)
T3 Uptake Ratio: 27 % (ref 24–39)
T4, Total: 8.9 ug/dL (ref 4.5–12.0)
TSH: 0.294 u[IU]/mL — ABNORMAL LOW (ref 0.450–4.500)

## 2019-01-15 LAB — RENAL FUNCTION PANEL
Albumin: 4.3 g/dL (ref 3.8–4.9)
BUN/Creatinine Ratio: 16 (ref 12–28)
BUN: 10 mg/dL (ref 8–27)
CO2: 27 mmol/L (ref 20–29)
Calcium: 9.1 mg/dL (ref 8.7–10.3)
Chloride: 100 mmol/L (ref 96–106)
Creatinine, Ser: 0.63 mg/dL (ref 0.57–1.00)
GFR calc Af Amer: 113 mL/min/{1.73_m2} (ref >59)
GFR calc non Af Amer: 98 mL/min/{1.73_m2} (ref >59)
Glucose: 123 mg/dL — ABNORMAL HIGH (ref 65–99)
Phosphorus: 3.3 mg/dL (ref 3.0–4.3)
Potassium: 4.6 mmol/L (ref 3.5–5.2)
Sodium: 140 mmol/L (ref 134–144)

## 2019-01-15 LAB — LIPID PANEL WITH LDL/HDL RATIO
Cholesterol, Total: 169 mg/dL (ref 100–199)
HDL: 76 mg/dL (ref >39)
LDL Chol Calc (NIH): 78 mg/dL (ref 0–99)
LDL/HDL Ratio: 1 ratio (ref 0.0–3.2)
Triglycerides: 81 mg/dL (ref 0–149)
VLDL Cholesterol Cal: 15 mg/dL (ref 5–40)

## 2019-01-15 LAB — CBC
Hematocrit: 35.4 % (ref 34.0–46.6)
Hemoglobin: 11.8 g/dL (ref 11.1–15.9)
MCH: 28.5 pg (ref 26.6–33.0)
MCHC: 33.3 g/dL (ref 31.5–35.7)
MCV: 86 fL (ref 79–97)
Platelets: 256 10*3/uL (ref 150–450)
RBC: 4.14 x10E6/uL (ref 3.77–5.28)
RDW: 13.6 % (ref 11.7–15.4)
WBC: 5.7 10*3/uL (ref 3.4–10.8)

## 2019-01-15 MED ORDER — LEVOTHYROXINE SODIUM 75 MCG PO TABS: ORAL_TABLET | ORAL | 0 refills | Status: DC

## 2019-02-03 IMAGING — US US TRANSVAGINAL NON-OB
1 series · 14 of 25 positions shown · non-contrast
Comparison: None

CLINICAL DATA: Patient with lower abdominal pain for 3 months.
Prior hysterectomy.

EXAM:
TRANSABDOMINAL AND TRANSVAGINAL ULTRASOUND OF PELVIS
TECHNIQUE: Both transabdominal and transvaginal ultrasound examinations of the
pelvis were performed. Transabdominal technique was performed for
global imaging of the pelvis including uterus, ovaries, adnexal
regions, and pelvic cul-de-sac. It was necessary to proceed with
endovaginal exam following the transabdominal exam to visualize the
pelvic structures.

[Series 1: us transvaginal non-ob · 0.23mm/px · 14 of 111 slices shown]
[im 1/111]
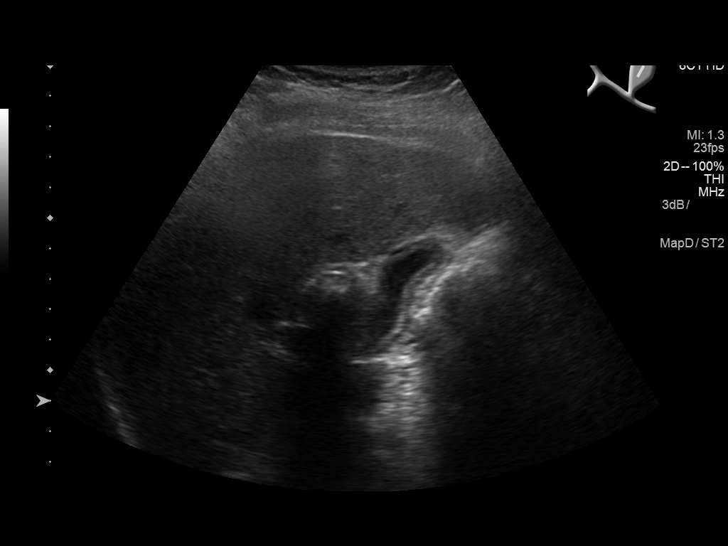
[im 10/111]
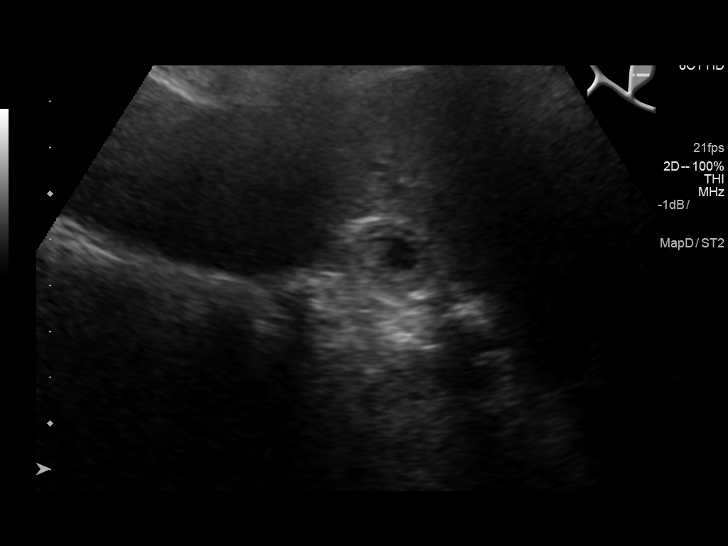
[im 19/111]
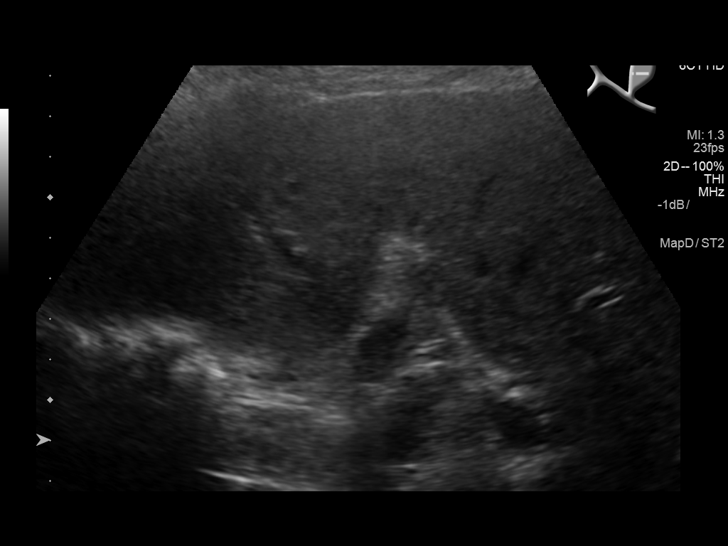
[im 28/111]
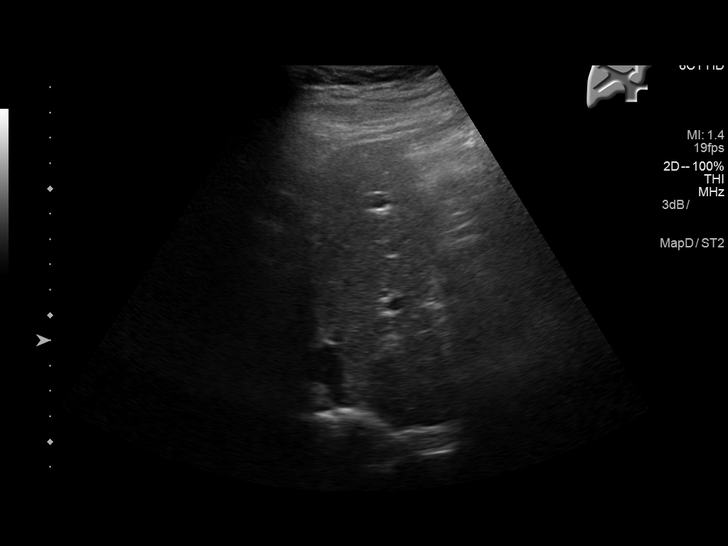
[im 37/111]
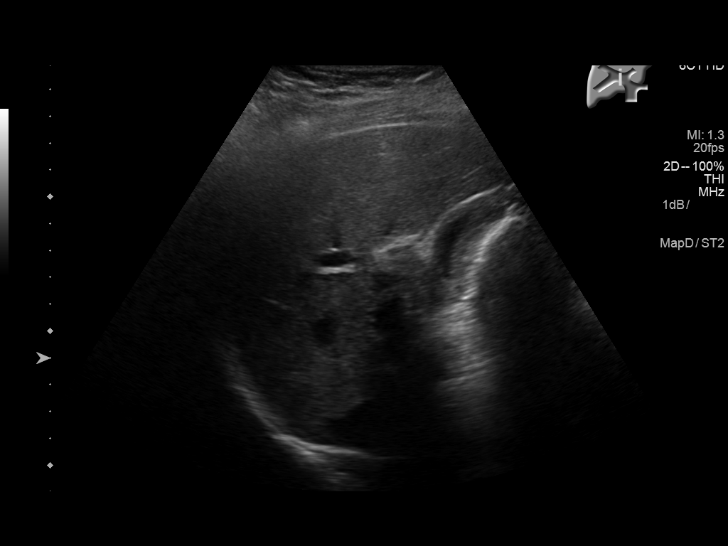
[im 42/111]
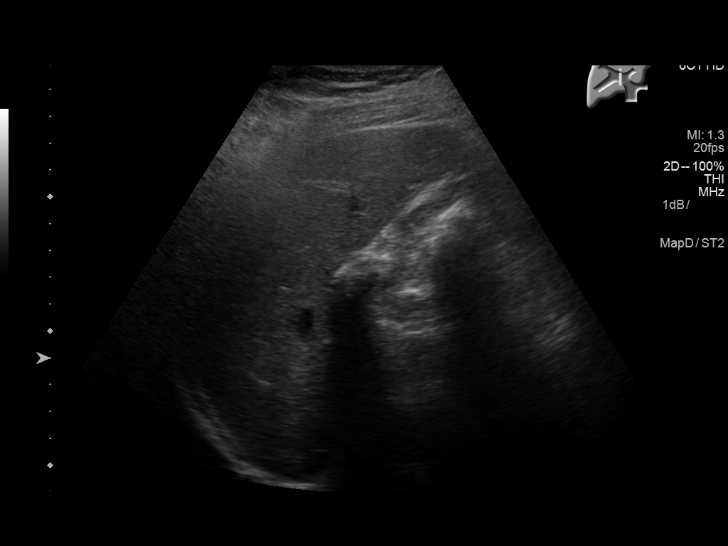
[im 51/111]
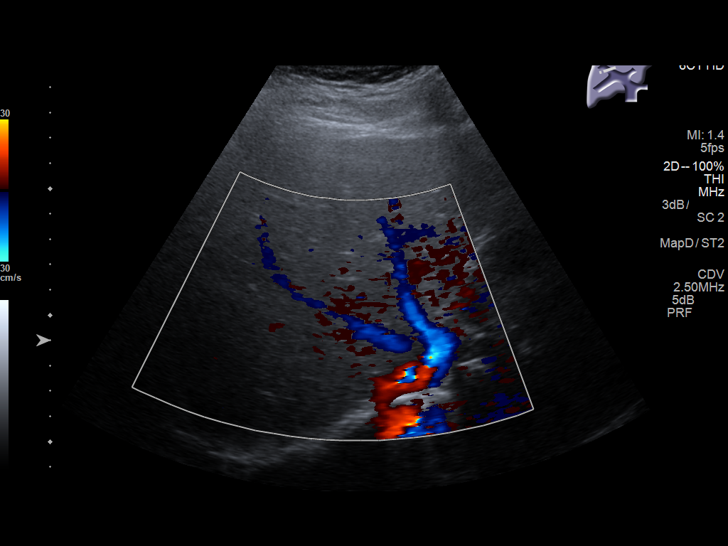
[im 60/111]
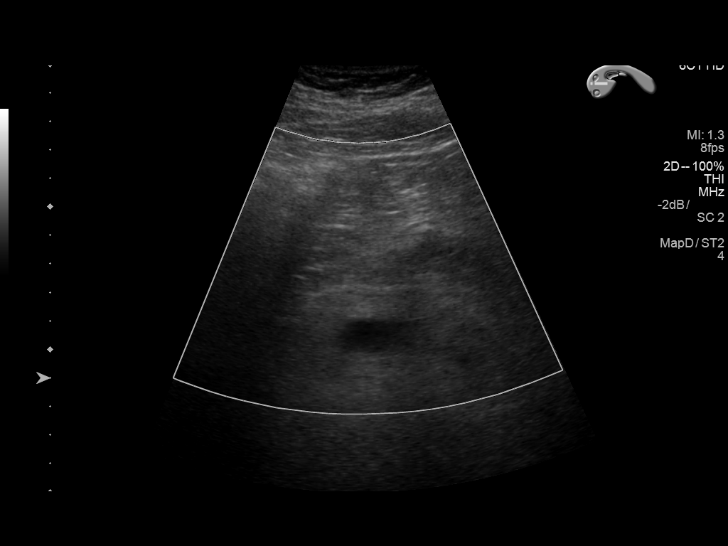
[im 69/111]
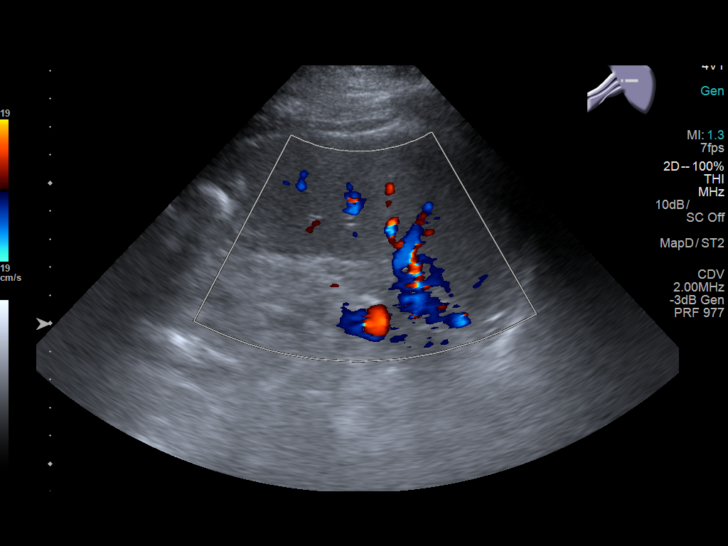
[im 74/111]
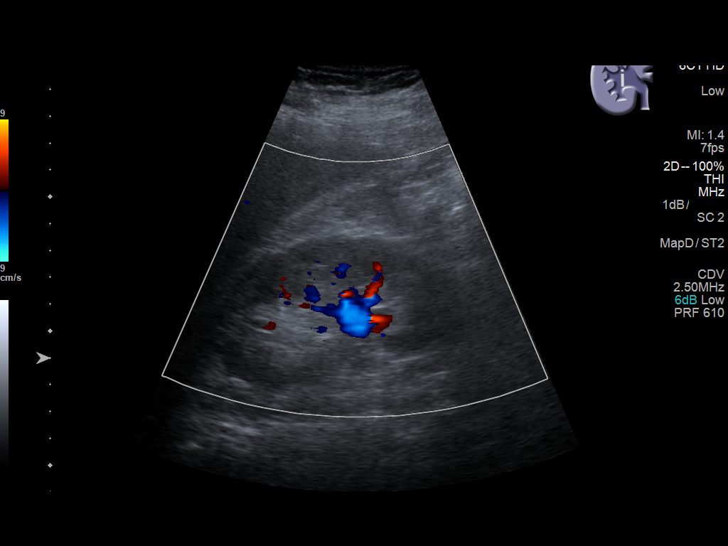
[im 83/111]
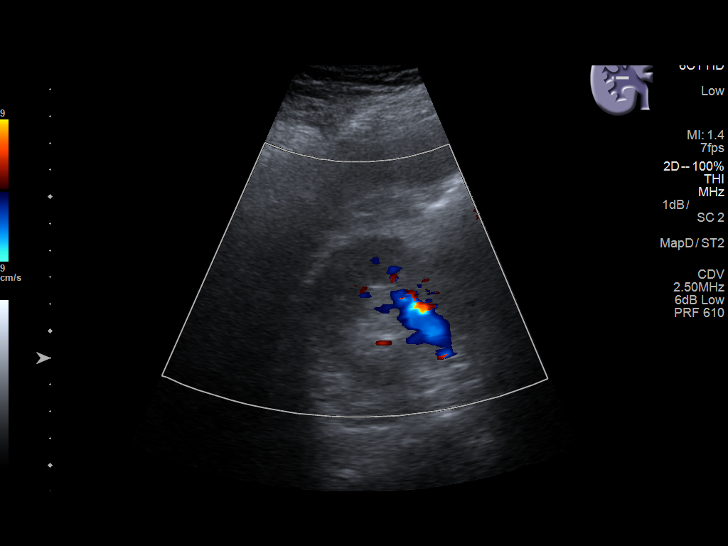
[im 92/111]
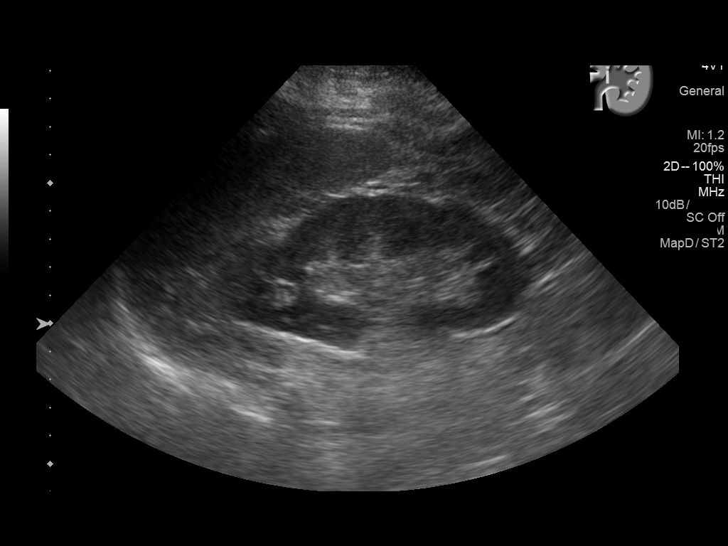
[im 101/111]
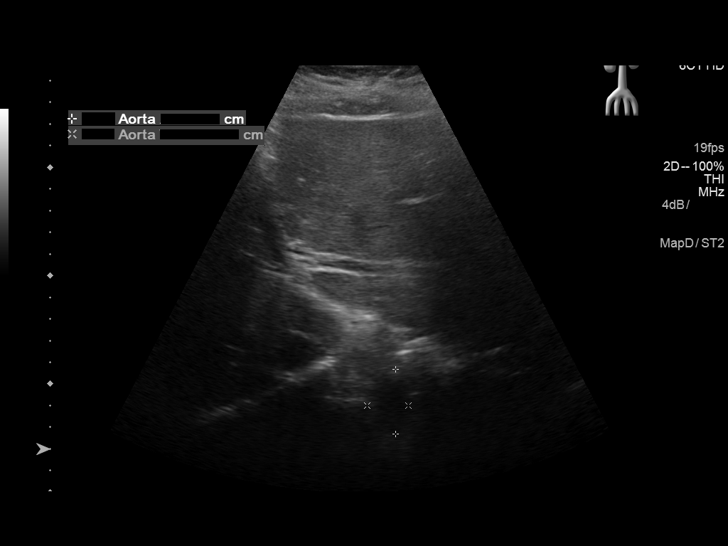
[im 111/111]
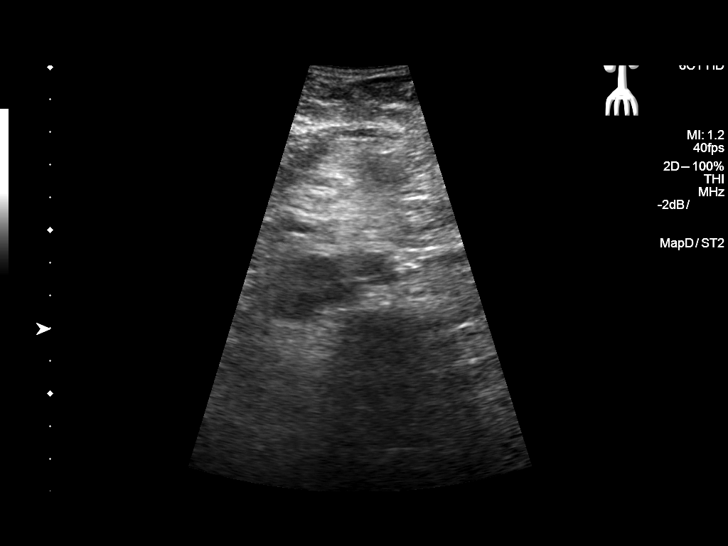

[14 of 25 positions shown; findings below may reference images not displayed]

FINDINGS: Uterus

Surgically absent.

Right ovary

Not visualized.

Left ovary

Not visualized.

Other findings

No abnormal free fluid.
IMPRESSION: Prior hysterectomy.

The ovaries are not visualized and therefore not assessed.

No pelvic mass identified.

## 2019-02-06 ENCOUNTER — Other Ambulatory Visit: Payer: Self-pay | Admitting: Family Medicine

## 2019-02-06 DIAGNOSIS — R7303 Prediabetes: Secondary | ICD-10-CM

## 2019-03-05 ENCOUNTER — Other Ambulatory Visit: Payer: Self-pay | Admitting: Family Medicine

## 2019-03-05 DIAGNOSIS — R7303 Prediabetes: Secondary | ICD-10-CM

## 2019-04-01 ENCOUNTER — Other Ambulatory Visit: Payer: Self-pay | Admitting: Family Medicine

## 2019-04-01 DIAGNOSIS — R7303 Prediabetes: Secondary | ICD-10-CM

## 2019-04-07 ENCOUNTER — Other Ambulatory Visit: Payer: Self-pay | Admitting: Family Medicine

## 2019-04-07 DIAGNOSIS — E039 Hypothyroidism, unspecified: Secondary | ICD-10-CM

## 2019-04-21 ENCOUNTER — Ambulatory Visit: Payer: Self-pay | Admitting: Family Medicine

## 2019-05-01 ENCOUNTER — Other Ambulatory Visit: Payer: Self-pay | Admitting: Family Medicine

## 2019-05-01 DIAGNOSIS — R7303 Prediabetes: Secondary | ICD-10-CM

## 2019-05-02 ENCOUNTER — Other Ambulatory Visit: Payer: Self-pay | Admitting: Family Medicine

## 2019-05-02 DIAGNOSIS — E039 Hypothyroidism, unspecified: Secondary | ICD-10-CM

## 2019-05-07 ENCOUNTER — Ambulatory Visit: Payer: BC Managed Care – PPO | Admitting: Family Medicine

## 2019-05-09 ENCOUNTER — Other Ambulatory Visit: Payer: Self-pay | Admitting: Family Medicine

## 2019-05-09 DIAGNOSIS — R5381 Other malaise: Secondary | ICD-10-CM

## 2019-05-09 DIAGNOSIS — F3341 Major depressive disorder, recurrent, in partial remission: Secondary | ICD-10-CM

## 2019-05-09 DIAGNOSIS — R5383 Other fatigue: Secondary | ICD-10-CM

## 2019-05-28 ENCOUNTER — Encounter: Payer: Self-pay | Admitting: Family Medicine

## 2019-05-28 ENCOUNTER — Ambulatory Visit (INDEPENDENT_AMBULATORY_CARE_PROVIDER_SITE_OTHER): Payer: BC Managed Care – PPO | Admitting: Family Medicine

## 2019-05-28 ENCOUNTER — Other Ambulatory Visit: Payer: Self-pay

## 2019-05-28 DIAGNOSIS — R7303 Prediabetes: Secondary | ICD-10-CM | POA: Diagnosis not present

## 2019-05-28 DIAGNOSIS — E039 Hypothyroidism, unspecified: Secondary | ICD-10-CM | POA: Diagnosis not present

## 2019-05-28 DIAGNOSIS — E782 Mixed hyperlipidemia: Secondary | ICD-10-CM

## 2019-05-28 DIAGNOSIS — I10 Essential (primary) hypertension: Secondary | ICD-10-CM | POA: Diagnosis not present

## 2019-05-28 DIAGNOSIS — F3341 Major depressive disorder, recurrent, in partial remission: Secondary | ICD-10-CM

## 2019-05-28 DIAGNOSIS — R5381 Other malaise: Secondary | ICD-10-CM

## 2019-05-28 DIAGNOSIS — R5383 Other fatigue: Secondary | ICD-10-CM

## 2019-05-28 MED ORDER — SIMVASTATIN 20 MG PO TABS: 20.0000 mg | ORAL_TABLET | Freq: Every day | ORAL | 1 refills | Status: DC

## 2019-05-28 MED ORDER — CITALOPRAM HYDROBROMIDE 20 MG PO TABS: 20.0000 mg | ORAL_TABLET | Freq: Every day | ORAL | 1 refills | Status: DC

## 2019-05-28 MED ORDER — LEVOTHYROXINE SODIUM 75 MCG PO TABS: ORAL_TABLET | ORAL | 1 refills | Status: DC

## 2019-05-28 MED ORDER — METFORMIN HCL ER 500 MG PO TB24: ORAL_TABLET | ORAL | 1 refills | Status: DC

## 2019-05-28 MED ORDER — HYDROCHLOROTHIAZIDE 25 MG PO TABS: 25.0000 mg | ORAL_TABLET | Freq: Every day | ORAL | 1 refills | Status: DC

## 2019-05-28 NOTE — Progress Notes (Signed)
Date:  05/28/2019   Name:  Phyllis Wagner   DOB:  03-23-1955   MRN:  [id number]   Chief Complaint: Hypertension, Hyperlipidemia, Diabetes, Allergic Rhinitis , Hypothyroidism, and Depression  Hypertension This is a chronic problem. The current episode started more than 1 year ago. The problem has been gradually improving since onset. The problem is controlled. Pertinent negatives include no anxiety, blurred vision, chest pain, headaches, malaise/fatigue, neck pain, orthopnea, palpitations, peripheral edema, PND, shortness of breath or sweats. There are no associated agents to hypertension. There are no known risk factors for coronary artery disease. Past treatments include diuretics. The current treatment provides moderate improvement. There are no compliance problems.  There is no history of angina, kidney disease, CAD/MI, CVA, heart failure, left ventricular hypertrophy, PVD or retinopathy. Identifiable causes of hypertension include a thyroid problem. There is no history of chronic renal disease, a hypertension causing med or renovascular disease.  Hyperlipidemia This is a chronic problem. The current episode started more than 1 year ago. The problem is controlled. Recent lipid tests were reviewed and are normal. Exacerbating diseases include diabetes and hypothyroidism. She has no history of chronic renal disease. Factors aggravating her hyperlipidemia include thiazides. Pertinent negatives include no chest pain, focal sensory loss, focal weakness, leg pain, myalgias or shortness of breath. Current antihyperlipidemic treatment includes statins. The current treatment provides moderate improvement of lipids. There are no compliance problems.   Diabetes She presents for her follow-up diabetic visit. Her disease course has been stable. Pertinent negatives for hypoglycemia include no dizziness, headaches, nervousness/anxiousness or sweats. Pertinent negatives for diabetes include no blurred  vision, no chest pain, no fatigue, no foot paresthesias, no foot ulcerations, no polydipsia, no polyphagia, no polyuria, no visual change, no weakness and no weight loss. Symptoms are stable. Pertinent negatives for diabetic complications include no CVA, heart disease, impotence, nephropathy, peripheral neuropathy, PVD or retinopathy. Risk factors for coronary artery disease include dyslipidemia, diabetes mellitus, hypertension and obesity. Current diabetic treatment includes oral agent (monotherapy). She is following a generally healthy diet. Her breakfast blood glucose is taken between 8-9 am. Her breakfast blood glucose range is generally 110-130 mg/dl. Eye exam is current.  Depression      The patient presents with depression.  This is a chronic problem.  The current episode started more than 1 year ago.   The onset quality is gradual.   Associated symptoms include helplessness, insomnia and irritable.  Associated symptoms include no decreased concentration, no fatigue, no hopelessness, no decreased interest, no myalgias, no headaches, not sad and no suicidal ideas.     The symptoms are aggravated by work stress and family issues.  Past treatments include SSRIs - Selective serotonin reuptake inhibitors.  Compliance with treatment is variable.  Previous treatment provided moderate relief.  Past medical history includes hypothyroidism, thyroid problem and depression.     Pertinent negatives include no anxiety. Thyroid Problem Presents for follow-up visit. Symptoms include depressed mood. Patient reports no anxiety, cold intolerance, constipation, diaphoresis, diarrhea, fatigue, hair loss, heat intolerance, menstrual problem, nail problem, palpitations, visual change, weight gain or weight loss. The symptoms have been stable. Her past medical history is significant for diabetes and hyperlipidemia. There is no history of heart failure.    Lab Results  Component Value Date   CREATININE 0.63 01/14/2019   BUN  10 01/14/2019   NA 140 01/14/2019   K 4.6 01/14/2019   CL 100 01/14/2019   CO2 27 01/14/2019   Lab Results  Component Value Date   CHOL 169 01/14/2019   HDL 76 01/14/2019   LDLCALC 78 01/14/2019   TRIG 81 01/14/2019   CHOLHDL 2.5 02/04/2018   Lab Results  Component Value Date   TSH 0.294 (L) 01/14/2019   Lab Results  Component Value Date   HGBA1C 7.1 (H) 01/14/2019   Lab Results  Component Value Date   WBC 5.7 01/14/2019   HGB 11.8 01/14/2019   HCT 35.4 01/14/2019   MCV 86 01/14/2019   PLT 256 01/14/2019   Lab Results  Component Value Date   ALT 31 07/04/2018   AST 35 07/04/2018   ALKPHOS 79 07/04/2018   BILITOT 0.3 07/04/2018     Review of Systems  Constitutional: Negative.  Negative for chills, diaphoresis, fatigue, fever, malaise/fatigue, unexpected weight change, weight gain and weight loss.  HENT: Negative for congestion, ear discharge, ear pain, rhinorrhea, sinus pressure, sneezing and sore throat.   Eyes: Negative for blurred vision, photophobia, pain, discharge, redness and itching.  Respiratory: Negative for cough, shortness of breath, wheezing and stridor.   Cardiovascular: Negative for chest pain, palpitations, orthopnea and PND.  Gastrointestinal: Negative for abdominal pain, blood in stool, constipation, diarrhea, nausea and vomiting.  Endocrine: Negative for cold intolerance, heat intolerance, polydipsia, polyphagia and polyuria.  Genitourinary: Negative for dysuria, flank pain, frequency, hematuria, impotence, menstrual problem, pelvic pain, urgency, vaginal bleeding and vaginal discharge.  Musculoskeletal: Negative for arthralgias, back pain, myalgias and neck pain.  Skin: Negative for rash.  Allergic/Immunologic: Negative for environmental allergies and food allergies.  Neurological: Negative for dizziness, focal weakness, weakness, light-headedness, numbness and headaches.  Hematological: Negative for adenopathy. Does not bruise/bleed easily.    Psychiatric/Behavioral: Positive for depression. Negative for decreased concentration, dysphoric mood and suicidal ideas. The patient has insomnia. The patient is not nervous/anxious.     Patient Active Problem List   Diagnosis Date Noted  . Calculus of gallbladder without cholecystitis without obstruction 06/12/2017  . Chronic maxillary sinusitis 03/15/2015  . Chronic rhinitis 03/15/2015  . Mild intermittent asthma without complication 03/15/2015  . Oroantral fistula 03/15/2015  . Essential hypertension 01/05/2015  . Thyroid activity decreased 01/05/2015  . Hyperlipidemia 01/05/2015  . Recurrent major depressive disorder, in partial remission (HCC) 01/05/2015    Allergies  Allergen Reactions  . Aspirin Anaphylaxis, Hives and Shortness Of Breath  . Biaxin [Clarithromycin] Anaphylaxis, Hives and Shortness Of Breath  . Nsaids Hives and Swelling  . Penicillins Anaphylaxis, Hives and Swelling    Has patient had a PCN reaction causing immediate rash, facial/tongue/throat swelling, SOB or lightheadedness with hypotension:Yes Has patient had a PCN reaction causing severe rash involving mucus membranes or skin necrosis: Unknown Has patient had a PCN reaction that required hospitalization: Unknown Has patient had a PCN reaction occurring within the last 10 years: No Childhood reaction. If all of the above answers are "NO", then may proceed with Cephalosporin use.   . Sulfa Antibiotics Hives  . Ancef [Cefazolin] Rash    Past Surgical History:  Procedure Laterality Date  . CHOLECYSTECTOMY N/A 06/21/2017   Procedure: LAPAROSCOPIC CHOLECYSTECTOMY WITH INTRAOPERATIVE CHOLANGIOGRAM;  Surgeon: Earline Mayotte, MD;  Location: [facility] ORS;  Service: General;  Laterality: N/A;  . COLONOSCOPY  2012   cleared for 10 yrs- [employer]  . EYE SURGERY Bilateral 2018  . GASTRIC BYPASS  2013   [facility] Ethelene Hal at [facility]  . KNEE SURGERY Left   . LYMPH NODE DISSECTION    . TONSILLECTOMY    . UMBILICAL  HERNIA  REPAIR  06/21/2017   Procedure: HERNIA REPAIR UMBILICAL ADULT;  Surgeon: Robert Bellow, MD;  Location: [facility] ORS;  Service: General;;  . VAGINAL HYSTERECTOMY     ovaries remain    Social History   Tobacco Use  . Smoking status: Former Smoker    Packs/day: 1.00    Years: 20.00    Pack years: 20.00    Types: Cigarettes    Quit date: 06/18/2010    Years since quitting: 8.9  . Smokeless tobacco: Never Used  Substance Use Topics  . Alcohol use: Not Currently    Alcohol/week: 0.0 standard drinks  . Drug use: No     Medication list has been reviewed and updated.  Current Meds  Medication Sig  . ascorbic acid (VITAMIN C) 250 MG CHEW Chew 250 mg by mouth daily.  . cholecalciferol (VITAMIN D) 1000 UNITS tablet Take 1,000 Units by mouth daily.  Marland Kitchen CINNAMON PO Take 8,000 mg by mouth daily.  . citalopram (CELEXA) 20 MG tablet TAKE 1 TABLET BY MOUTH EVERY DAY  . fluticasone (FLONASE) 50 MCG/ACT nasal spray Place 2 sprays into both nostrils daily.  . Glucosamine-Chondroitin (COSAMIN DS PO) Take 2 tablets by mouth daily.  . hydrochlorothiazide (HYDRODIURIL) 25 MG tablet Take 1 tablet (25 mg total) by mouth daily.  Marland Kitchen levothyroxine (SYNTHROID) 75 MCG tablet TAKE 1 TABLET (75 MCG TOTAL) BY MOUTH DAILY BEFORE BREAKFAST.  . metFORMIN (GLUCOPHAGE-XR) 500 MG 24 hr tablet TAKE 1 TABLET BY MOUTH EVERY DAY WITH BREAKFAST  . Omega-3 Fatty Acids (FISH OIL) 1000 MG CAPS Take 1,000 mg by mouth daily.   Vladimir Faster Glycol-Propyl Glycol (SYSTANE ULTRA) 0.4-0.3 % SOLN Place 1 drop into both eyes 3 (three) times daily as needed (for dry eyes.).  Marland Kitchen simvastatin (ZOCOR) 20 MG tablet Take 1 tablet (20 mg total) by mouth daily.    PHQ 2/9 Scores 05/28/2019 01/14/2019 07/04/2018 02/04/2018  PHQ - 2 Score 0 0 0 0  PHQ- 9 Score 0 4 2 1     BP Readings from Last 3 Encounters:  05/28/19 122/62  01/14/19 120/70  07/04/18 120/62    Physical Exam Constitutional:      General: She is irritable. She is not in  acute distress.    Appearance: Normal appearance. She is not diaphoretic.  HENT:     Head: Normocephalic and atraumatic.     Right Ear: Tympanic membrane, ear canal and external ear normal. There is no impacted cerumen.     Left Ear: Tympanic membrane, ear canal and external ear normal. There is no impacted cerumen.     Nose: Nose normal. No congestion.     Mouth/Throat:     Mouth: Mucous membranes are moist.     Pharynx: No oropharyngeal exudate or posterior oropharyngeal erythema.  Eyes:     General:        Right eye: No discharge.        Left eye: No discharge.     Conjunctiva/sclera: Conjunctivae normal.     Pupils: Pupils are equal, round, and reactive to light.  Neck:     Thyroid: No thyromegaly.     Vascular: No JVD.  Cardiovascular:     Rate and Rhythm: Normal rate and regular rhythm.     Heart sounds: Normal heart sounds. No murmur. No friction rub. No gallop.   Pulmonary:     Effort: Pulmonary effort is normal.     Breath sounds: Normal breath sounds. No wheezing or rhonchi.  Abdominal:     General: Bowel sounds are normal.     Palpations: Abdomen is soft. There is no mass.     Tenderness: There is no abdominal tenderness. There is no guarding.  Musculoskeletal:        General: Normal range of motion.     Cervical back: Normal range of motion and neck supple.  Lymphadenopathy:     Cervical: No cervical adenopathy.  Skin:    General: Skin is warm and dry.  Neurological:     General: No focal deficit present.     Mental Status: She is alert.     Deep Tendon Reflexes: Reflexes are normal and symmetric.     Wt Readings from Last 3 Encounters:  05/28/19 177 lb (80.3 kg)  01/14/19 185 lb (83.9 kg)  07/04/18 185 lb (83.9 kg)    BP 122/62   Pulse 80   Ht 5\' 3"  (1.6 m)   Wt 177 lb (80.3 kg)   BMI 31.35 kg/m   Assessment and Plan:  1. Malaise and fatigue Chronic.  Controlled.  Stable Continue citalopram 20 mg once a day.  Will recheck in 6 months. -  citalopram (CELEXA) 20 MG tablet; Take 1 tablet (20 mg total) by mouth daily.  Dispense: 90 tablet; Refill: 1  2. Recurrent major depressive disorder, in partial remission (HCC) .  Controlled.  Stable.  PHQ 0  .  Continue citalopram 20 mg once a day. - citalopram (CELEXA) 20 MG tablet; Take 1 tablet (20 mg total) by mouth daily.  Dispense: 90 tablet; Refill: 1  3. Essential hypertension Chronic.  Controlled.  Stable.  Blood pressure doing excellent.  Continue hydrochlorothiazide 25 mg once a day.  Will check CMP.  Recheck patient in 6 months. - hydrochlorothiazide (HYDRODIURIL) 25 MG tablet; Take 1 tablet (25 mg total) by mouth daily.  Dispense: 90 tablet; Refill: 1 - Comprehensive Metabolic Panel (CMET)  4. Hypothyroidism, unspecified type Chronic.  Controlled.  Stable.  Continue levothyroxine 75 mcg daily we will check TSH with thyroid panel. - levothyroxine (SYNTHROID) 75 MCG tablet; One tablet daily  Dispense: 90 tablet; Refill: 1 - Thyroid Panel With TSH  5. Prediabetes Panic.  Controlled.  Stable.  Fasting blood sugars in the 80-1 30 range.  We will continue Metformin 500 mg XR daily.  Will check CMP for GFR and hemoglobin A1c. - metFORMIN (GLUCOPHAGE-XR) 500 MG 24 hr tablet; TAKE 1 TABLET BY MOUTH EVERY DAY WITH BREAKFAST  Dispense: 90 tablet; Refill: 1 - HgB A1c - Comprehensive Metabolic Panel (CMET)  6. Mixed hyperlipidemia Chronic.  Controlled.  Stable.  Continue simvastatin 20 mg once a day.  Will check lipid panel. - simvastatin (ZOCOR) 20 MG tablet; Take 1 tablet (20 mg total) by mouth daily.  Dispense: 90 tablet; Refill: 1 - Lipid Panel With LDL/HDL Ratio

## 2019-05-29 LAB — COMPREHENSIVE METABOLIC PANEL
ALT: 13 IU/L (ref 0–32)
AST: 22 IU/L (ref 0–40)
Albumin/Globulin Ratio: 1.6 (ref 1.2–2.2)
Albumin: 4.6 g/dL (ref 3.8–4.8)
Alkaline Phosphatase: 79 IU/L (ref 39–117)
BUN/Creatinine Ratio: 14 (ref 12–28)
BUN: 10 mg/dL (ref 8–27)
Bilirubin Total: 0.3 mg/dL (ref 0.0–1.2)
CO2: 24 mmol/L (ref 20–29)
Calcium: 9.3 mg/dL (ref 8.7–10.3)
Chloride: 97 mmol/L (ref 96–106)
Creatinine, Ser: 0.71 mg/dL (ref 0.57–1.00)
GFR calc Af Amer: 106 mL/min/{1.73_m2} (ref >59)
GFR calc non Af Amer: 92 mL/min/{1.73_m2} (ref >59)
Globulin, Total: 2.8 g/dL (ref 1.5–4.5)
Glucose: 117 mg/dL — ABNORMAL HIGH (ref 65–99)
Potassium: 4.3 mmol/L (ref 3.5–5.2)
Sodium: 139 mmol/L (ref 134–144)
Total Protein: 7.4 g/dL (ref 6.0–8.5)

## 2019-05-29 LAB — HEMOGLOBIN A1C
Est. average glucose Bld gHb Est-mCnc: 151 mg/dL
Hgb A1c MFr Bld: 6.9 % — ABNORMAL HIGH (ref 4.8–5.6)

## 2019-05-29 LAB — THYROID PANEL WITH TSH
Free Thyroxine Index: 1.9 (ref 1.2–4.9)
T3 Uptake Ratio: 24 % (ref 24–39)
T4, Total: 7.9 ug/dL (ref 4.5–12.0)
TSH: 0.517 u[IU]/mL (ref 0.450–4.500)

## 2019-05-29 LAB — LIPID PANEL WITH LDL/HDL RATIO
Cholesterol, Total: 169 mg/dL (ref 100–199)
HDL: 75 mg/dL (ref >39)
LDL Chol Calc (NIH): 77 mg/dL (ref 0–99)
LDL/HDL Ratio: 1 ratio (ref 0.0–3.2)
Triglycerides: 91 mg/dL (ref 0–149)
VLDL Cholesterol Cal: 17 mg/dL (ref 5–40)

## 2019-06-04 ENCOUNTER — Ambulatory Visit: Payer: Self-pay | Attending: Internal Medicine

## 2019-06-04 DIAGNOSIS — Z23 Encounter for immunization: Secondary | ICD-10-CM

## 2019-06-04 NOTE — Progress Notes (Signed)
   Covid-19 Vaccination Clinic  Name:  Rusty Glodowski    MRN: [id number] DOB: Sep 28, 1955  06/04/2019  Ms. Croll was observed post Covid-19 immunization for 15 minutes without incident. She was provided with Vaccine Information Sheet and instruction to access the V-Safe system.   Ms. Berkley was instructed to call 911 with any severe reactions post vaccine: Marland Kitchen Difficulty breathing  . Swelling of face and throat  . A fast heartbeat  . A bad rash all over body  . Dizziness and weakness   Immunizations Administered    Name Date Dose VIS Date Route   Pfizer COVID-19 Vaccine 06/04/2019  8:49 AM 0.3 mL 02/20/2019 Intramuscular   Manufacturer: [employer], [employer]   Lot: [id number]   NDC: [address]-5

## 2019-06-30 ENCOUNTER — Ambulatory Visit: Payer: Self-pay | Attending: Internal Medicine

## 2019-06-30 DIAGNOSIS — Z23 Encounter for immunization: Secondary | ICD-10-CM

## 2019-06-30 NOTE — Progress Notes (Signed)
   Covid-19 Vaccination Clinic  Name:  Phyllis Wagner    MRN: [id number] DOB: September 28, 1955  06/30/2019  [name] was observed post Covid-19 immunization for 15 minutes without incident. She was provided with Vaccine Information Sheet and instruction to access the V-Safe system.   [name] was instructed to call 911 with any severe reactions post vaccine: Marland Kitchen Difficulty breathing  . Swelling of face and throat  . A fast heartbeat  . A bad rash all over body  . Dizziness and weakness   Immunizations Administered    Name Date Dose VIS Date Route   Pfizer COVID-19 Vaccine 06/30/2019  8:33 AM 0.3 mL 05/06/2018 Intramuscular   Manufacturer: [employer], [employer]   Lot: [id number]   NDC: [address]-6

## 2019-10-13 ENCOUNTER — Encounter: Payer: Self-pay | Admitting: Family Medicine

## 2019-10-13 ENCOUNTER — Ambulatory Visit (INDEPENDENT_AMBULATORY_CARE_PROVIDER_SITE_OTHER): Payer: BC Managed Care – PPO | Admitting: Family Medicine

## 2019-10-13 ENCOUNTER — Other Ambulatory Visit: Payer: Self-pay

## 2019-10-13 VITALS — BP 116/70 | HR 58 | Ht 63.0 in | Wt 174.0 lb

## 2019-10-13 DIAGNOSIS — J301 Allergic rhinitis due to pollen: Secondary | ICD-10-CM

## 2019-10-13 DIAGNOSIS — E782 Mixed hyperlipidemia: Secondary | ICD-10-CM

## 2019-10-13 DIAGNOSIS — R5381 Other malaise: Secondary | ICD-10-CM

## 2019-10-13 DIAGNOSIS — I10 Essential (primary) hypertension: Secondary | ICD-10-CM | POA: Diagnosis not present

## 2019-10-13 DIAGNOSIS — F3341 Major depressive disorder, recurrent, in partial remission: Secondary | ICD-10-CM | POA: Diagnosis not present

## 2019-10-13 DIAGNOSIS — E039 Hypothyroidism, unspecified: Secondary | ICD-10-CM

## 2019-10-13 DIAGNOSIS — Z1211 Encounter for screening for malignant neoplasm of colon: Secondary | ICD-10-CM

## 2019-10-13 DIAGNOSIS — R7303 Prediabetes: Secondary | ICD-10-CM | POA: Diagnosis not present

## 2019-10-13 DIAGNOSIS — R5383 Other fatigue: Secondary | ICD-10-CM

## 2019-10-13 MED ORDER — FLUTICASONE PROPIONATE 50 MCG/ACT NA SUSP: 2.0000 | Freq: Every day | NASAL | 11 refills | Status: DC

## 2019-10-13 MED ORDER — LEVOTHYROXINE SODIUM 75 MCG PO TABS: ORAL_TABLET | ORAL | 1 refills | Status: DC

## 2019-10-13 MED ORDER — CITALOPRAM HYDROBROMIDE 20 MG PO TABS: 20.0000 mg | ORAL_TABLET | Freq: Every day | ORAL | 1 refills | Status: DC

## 2019-10-13 MED ORDER — METFORMIN HCL ER 500 MG PO TB24: ORAL_TABLET | ORAL | 1 refills | Status: DC

## 2019-10-13 MED ORDER — SIMVASTATIN 20 MG PO TABS: 20.0000 mg | ORAL_TABLET | Freq: Every day | ORAL | 1 refills | Status: DC

## 2019-10-13 MED ORDER — FISH OIL 1000 MG PO CAPS: 1000.0000 mg | ORAL_CAPSULE | Freq: Every day | ORAL | 12 refills | Status: AC

## 2019-10-13 MED ORDER — HYDROCHLOROTHIAZIDE 25 MG PO TABS: 25.0000 mg | ORAL_TABLET | Freq: Every day | ORAL | 1 refills | Status: DC

## 2019-10-13 NOTE — Progress Notes (Signed)
Date:  10/13/2019   Name:  Phyllis Wagner   DOB:  [birth year]   MRN:  [id number]   Chief Complaint: Diabetes (follow up, has been between 80-140) and Colon Cancer Screening (fit or cologuard )  Diabetes She presents for her follow-up diabetic visit. She has type 2 diabetes mellitus. Her disease course has been stable. There are no hypoglycemic associated symptoms. Pertinent negatives for hypoglycemia include no dizziness, headaches, nervousness/anxiousness or tremors. There are no diabetic associated symptoms. Pertinent negatives for diabetes include no chest pain, no fatigue, no polydipsia, no polyphagia, no polyuria, no visual change, no weakness and no weight loss. There are no hypoglycemic complications. Symptoms are stable. There are no diabetic complications. Risk factors for coronary artery disease include diabetes mellitus and dyslipidemia. Current diabetic treatment includes oral agent (monotherapy). She is compliant with treatment all of the time. She is following a generally healthy diet. Meal planning includes avoidance of concentrated sweets and carbohydrate counting. She participates in exercise intermittently. Her home blood glucose trend is fluctuating minimally. Her breakfast blood glucose is taken between 8-9 am. Her breakfast blood glucose range is generally 90-110 mg/dl. An ACE inhibitor/angiotensin II receptor blocker is not being taken.  Thyroid Problem Presents for follow-up visit. Symptoms include cold intolerance. Patient reports no anxiety, constipation, depressed mood, diaphoresis, diarrhea, dry skin, fatigue, hair loss, heat intolerance, hoarse voice, leg swelling, menstrual problem, nail problem, palpitations, tremors, visual change, weight gain or weight loss. The symptoms have been stable. Her past medical history is significant for hyperlipidemia. There is no history of diabetes.  Hyperlipidemia This is a chronic problem. The current episode started more than 1  year ago. The problem is controlled. Recent lipid tests were reviewed and are normal. She has no history of chronic renal disease, diabetes, hypothyroidism, liver disease, obesity or nephrotic syndrome. There are no known factors aggravating her hyperlipidemia. Pertinent negatives include no chest pain, focal sensory loss, focal weakness, leg pain, myalgias or shortness of breath. Current antihyperlipidemic treatment includes statins. The current treatment provides moderate improvement of lipids. There are no compliance problems.  Risk factors for coronary artery disease include hypertension and dyslipidemia.  Depression        This is a chronic problem.  The current episode started more than 1 year ago.   The onset quality is gradual.   The problem occurs intermittently.  The problem has been gradually improving since onset.  Associated symptoms include no decreased concentration, no fatigue, no helplessness, no hopelessness, does not have insomnia, no restlessness, no decreased interest, no appetite change, no body aches, no myalgias, no headaches, no indigestion, not sad and no suicidal ideas.  Past treatments include SSRIs - Selective serotonin reuptake inhibitors.  Past medical history includes thyroid problem.     Pertinent negatives include no hypothyroidism. Hypertension This is a chronic problem. The current episode started more than 1 year ago. The problem has been gradually improving since onset. The problem is controlled. Pertinent negatives include no chest pain, headaches, palpitations or shortness of breath. Identifiable causes of hypertension include a thyroid problem. There is no history of chronic renal disease.  URI  This is a chronic (for allergic rhinitis) problem. There has been no fever. Pertinent negatives include no abdominal pain, chest pain, congestion, coughing, diarrhea, dysuria, ear pain, headaches, nausea, rash, rhinorrhea, sneezing, sore throat, vomiting or wheezing.    Lab  Results  Component Value Date   CREATININE 0.71 05/28/2019   BUN 10 05/28/2019  NA 139 05/28/2019   K 4.3 05/28/2019   CL 97 05/28/2019   CO2 24 05/28/2019   Lab Results  Component Value Date   CHOL 169 05/28/2019   HDL 75 05/28/2019   LDLCALC 77 05/28/2019   TRIG 91 05/28/2019   CHOLHDL 2.5 02/04/2018   Lab Results  Component Value Date   TSH 0.517 05/28/2019   Lab Results  Component Value Date   HGBA1C 6.9 (H) 05/28/2019   Lab Results  Component Value Date   WBC 5.7 01/14/2019   HGB 11.8 01/14/2019   HCT 35.4 01/14/2019   MCV 86 01/14/2019   PLT 256 01/14/2019   Lab Results  Component Value Date   ALT 13 05/28/2019   AST 22 05/28/2019   ALKPHOS 79 05/28/2019   BILITOT 0.3 05/28/2019     Review of Systems  Constitutional: Negative.  Negative for appetite change, chills, diaphoresis, fatigue, fever, unexpected weight change, weight gain and weight loss.  HENT: Negative for congestion, ear discharge, ear pain, hoarse voice, rhinorrhea, sinus pressure, sneezing and sore throat.   Eyes: Negative for photophobia, pain, discharge, redness and itching.  Respiratory: Negative for cough, shortness of breath, wheezing and stridor.   Cardiovascular: Negative for chest pain and palpitations.  Gastrointestinal: Negative for abdominal pain, blood in stool, constipation, diarrhea, nausea and vomiting.  Endocrine: Positive for cold intolerance. Negative for heat intolerance, polydipsia, polyphagia and polyuria.  Genitourinary: Negative for dysuria, flank pain, frequency, hematuria, menstrual problem, pelvic pain, urgency, vaginal bleeding and vaginal discharge.  Musculoskeletal: Negative for arthralgias, back pain and myalgias.  Skin: Negative for rash.  Allergic/Immunologic: Negative for environmental allergies and food allergies.  Neurological: Negative for dizziness, tremors, focal weakness, weakness, light-headedness, numbness and headaches.  Hematological: Negative for  adenopathy. Does not bruise/bleed easily.  Psychiatric/Behavioral: Positive for depression. Negative for decreased concentration, dysphoric mood and suicidal ideas. The patient is not nervous/anxious and does not have insomnia.     Patient Active Problem List   Diagnosis Date Noted  . Calculus of gallbladder without cholecystitis without obstruction 06/12/2017  . Chronic maxillary sinusitis 03/15/2015  . Chronic rhinitis 03/15/2015  . Mild intermittent asthma without complication 03/15/2015  . Oroantral fistula 03/15/2015  . Essential hypertension 01/05/2015  . Thyroid activity decreased 01/05/2015  . Hyperlipidemia 01/05/2015  . Recurrent major depressive disorder, in partial remission (HCC) 01/05/2015    Allergies  Allergen Reactions  . Aspirin Anaphylaxis, Hives and Shortness Of Breath  . Biaxin [Clarithromycin] Anaphylaxis, Hives and Shortness Of Breath  . Nsaids Hives and Swelling  . Penicillins Anaphylaxis, Hives and Swelling    Has patient had a PCN reaction causing immediate rash, facial/tongue/throat swelling, SOB or lightheadedness with hypotension:Yes Has patient had a PCN reaction causing severe rash involving mucus membranes or skin necrosis: Unknown Has patient had a PCN reaction that required hospitalization: Unknown Has patient had a PCN reaction occurring within the last 10 years: No Childhood reaction. If all of the above answers are "NO", then may proceed with Cephalosporin use.   . Sulfa Antibiotics Hives  . Ancef [Cefazolin] Rash    Past Surgical History:  Procedure Laterality Date  . CHOLECYSTECTOMY N/A 06/21/2017   Procedure: LAPAROSCOPIC CHOLECYSTECTOMY WITH INTRAOPERATIVE CHOLANGIOGRAM;  Surgeon: Earline MayotteByrnett, Jeffrey W, MD;  Location: [facility] ORS;  Service: General;  Laterality: N/A;  . COLONOSCOPY  2012   [facility]  . EYE SURGERY Bilateral 2018  . GA[facility] BYPASS  [facility]  .  KNEE SURGERY Left   . LYMPH NODE DISSECTION     . TONSILLECTOMY    . UMBILICAL HERNIA REPAIR  06/21/2017   Procedure: HERNIA REPAIR UMBILICAL ADULT;  Surgeon: Earline Mayotte, MD;  Location: [facility] ORS;  Service: General;;  . VAGINAL HYSTERECTOMY     ovaries remain    Social History   Tobacco Use  . Smoking status: Former Smoker    Packs/day: 1.00    Years: 20.00    Pack years: 20.00    Types: Cigarettes    Quit date: 06/18/2010    Years since quitting: 9.3  . Smokeless tobacco: Never Used  Vaping Use  . Vaping Use: Never used  Substance Use Topics  . Alcohol use: Not Currently    Alcohol/week: 0.0 standard drinks  . Drug use: No     Medication list has been reviewed and updated.  Current Meds  Medication Sig  . APPLE CIDER VINEGAR PO Take 2 tablets by mouth daily.  Marland Kitchen ascorbic acid (VITAMIN C) 250 MG CHEW Chew 250 mg by mouth daily.  . cholecalciferol (VITAMIN D) 1000 UNITS tablet Take 1,000 Units by mouth daily.  Marland Kitchen CINNAMON PO Take 8,000 mg by mouth daily.  . citalopram (CELEXA) 20 MG tablet Take 1 tablet (20 mg total) by mouth daily.  . fluticasone (FLONASE) 50 MCG/ACT nasal spray Place 2 sprays into both nostrils daily.  . Glucosamine-Chondroitin (COSAMIN DS PO) Take 2 tablets by mouth daily.  . hydrochlorothiazide (HYDRODIURIL) 25 MG tablet Take 1 tablet (25 mg total) by mouth daily.  Marland Kitchen levothyroxine (SYNTHROID) 75 MCG tablet One tablet daily  . metFORMIN (GLUCOPHAGE-XR) 500 MG 24 hr tablet TAKE 1 TABLET BY MOUTH EVERY DAY WITH BREAKFAST  . Omega-3 Fatty Acids (FISH OIL) 1000 MG CAPS Take 1,000 mg by mouth daily.   Bertram Gala Glycol-Propyl Glycol (SYSTANE ULTRA) 0.4-0.3 % SOLN Place 1 drop into both eyes 3 (three) times daily as needed (for dry eyes.).  Marland Kitchen simvastatin (ZOCOR) 20 MG tablet Take 1 tablet (20 mg total) by mouth daily.  . vitamin B-12 (CYANOCOBALAMIN) 500 MCG tablet Take 500 mcg by mouth daily.    PHQ 2/9 Scores 10/13/2019 05/28/2019 01/14/2019 07/04/2018  PHQ - 2 Score 0 0 0 0  PHQ- 9 Score 0 0 4 2      GAD 7 : Generalized Anxiety Score 10/13/2019 05/28/2019 01/14/2019  Nervous, Anxious, on Edge 0 0 0  Control/stop worrying 0 0 0  Worry too much - different things 0 0 0  Trouble relaxing 0 0 0  Restless 0 0 0  Easily annoyed or irritable 0 0 0  Afraid - awful might happen 0 0 0  Total GAD 7 Score 0 0 0  Anxiety Difficulty Not difficult at all - -    BP Readings from Last 3 Encounters:  10/13/19 116/70  05/28/19 122/62  01/14/19 120/70    Physical Exam Vitals and nursing note reviewed.  Constitutional:      Appearance: She is well-developed.  HENT:     Head: Normocephalic.     Right Ear: Tympanic membrane, ear canal and external ear normal. There is no impacted cerumen.     Left Ear: Tympanic membrane, ear canal and external ear normal. There is no impacted cerumen.     Nose: Nose normal.  Eyes:     General: Lids are everted, no foreign bodies appreciated. No scleral icterus.       Left eye: No foreign body or hordeolum.  Conjunctiva/sclera: Conjunctivae normal.     Right eye: Right conjunctiva is not injected.     Left eye: Left conjunctiva is not injected.     Pupils: Pupils are equal, round, and reactive to light.  Neck:     Thyroid: No thyromegaly.     Vascular: No JVD.     Trachea: No tracheal deviation.  Cardiovascular:     Rate and Rhythm: Normal rate and regular rhythm.     Heart sounds: Normal heart sounds. No murmur heard.  No friction rub. No gallop.   Pulmonary:     Effort: Pulmonary effort is normal. No respiratory distress.     Breath sounds: Normal breath sounds. No wheezing, rhonchi or rales.  Abdominal:     General: Bowel sounds are normal.     Palpations: Abdomen is soft. There is no mass.     Tenderness: There is no abdominal tenderness. There is no guarding or rebound.  Musculoskeletal:        General: No tenderness. Normal range of motion.     Cervical back: Normal range of motion and neck supple.  Lymphadenopathy:     Cervical: No  cervical adenopathy.  Skin:    General: Skin is warm.     Findings: No rash.  Neurological:     Mental Status: She is alert and oriented to person, place, and time.     Cranial Nerves: No cranial nerve deficit.     Deep Tendon Reflexes: Reflexes normal.  Psychiatric:        Mood and Affect: Mood is not anxious or depressed.     Wt Readings from Last 3 Encounters:  10/13/19 174 lb (78.9 kg)  05/28/19 177 lb (80.3 kg)  01/14/19 185 lb (83.9 kg)    BP 116/70   Pulse (!) 58   Ht  (1.6 m)   Wt 174 lb (78.9 kg)   SpO2 97%   BMI 30.82 kg/m   Assessment and Plan:  1. Essential hypertension Chronic.  Controlled.  Stable.  We will continue hydrochlorothiazide 25 mg once a day.  Will check renal function panel. - hydrochlorothiazide (HYDRODIURIL) 25 MG tablet; Take 1 tablet (25 mg total) by mouth daily.  Dispense: 90 tablet; Refill: 1 - Renal Function Panel  2. Hypothyroidism, unspecified type Chronic.  Controlled.  Stable.  Continue levothyroxine currently at 75 mcg until we have TSH results and will adjust accordingly. - levothyroxine (SYNTHROID) 75 MCG tablet; One tablet daily  Dispense: 90 tablet; Refill: 1 - TSH  3. Malaise and fatigue Chronic.  Controlled.  Stable.  PHQ was noted to be 0 as well as gad score is 0.  We will continue on patient's preference to continue citalopram 20 mg once a day. - citalopram (CELEXA) 20 MG tablet; Take 1 tablet (20 mg total) by mouth daily.  Dispense: 90 tablet; Refill: 1  4. Recurrent major depressive disorder, in partial remission (HCC) Chronic.  Controlled.  Stable.  Continue citalopram 20 mg once a day. - citalopram (CELEXA) 20 MG tablet; Take 1 tablet (20 mg total) by mouth daily.  Dispense: 90 tablet; Refill: 1  5. Mixed hyperlipidemia Chronic.  Controlled.  Stable.  Continue simvastatin 20 mg once a day.  As well as OTC omega-3 1 g daily. - simvastatin (ZOCOR) 20 MG tablet; Take 1 tablet (20 mg total) by mouth daily.  Dispense:  90 tablet; Refill: 1 - Omega-3 Fatty Acids (FISH OIL) 1000 MG CAPS; Take 1 capsule (1,000 mg total) by  mouth daily.  Dispense: 100 capsule; Refill: 12 - Lipid Panel With LDL/HDL Ratio  6. Prediabetes Chronic.  Controlled.  Stable.  Patient relates no polyuria polydipsia previous A1c's have been in the prediabetic range.  Patient is currently on Metformin XR 500 mg once a day.  We will continue with this as well as check renal function panel to assess GFR and electrolytes. - metFORMIN (GLUCOPHAGE-XR) 500 MG 24 hr tablet; TAKE 1 TABLET BY MOUTH EVERY DAY WITH BREAKFAST  Dispense: 90 tablet; Refill: 1 - Hemoglobin A1c - Renal Function Panel  7. Seasonal allergic rhinitis due to pollen Chronic.  Controlled.  Stable.  Patient would like to continue her Flonase she has a slight serous otitis in the left ear which is consistent with eustachian tube dysfunction. - fluticasone (FLONASE) 50 MCG/ACT nasal spray; Place 2 sprays into both nostrils daily.  Dispense: 16 g; Refill: 11  8. Colon cancer screening We have discussed this with patient and patient is amenable to a colonoscopy but would like to have this with Dr. Doristine Counter if possible.  We will put in a referral to Dr. Doristine Counter for evaluation and continuance with procedure if it is appropriate. - Ambulatory referral to General Surgery

## 2019-10-14 ENCOUNTER — Encounter: Payer: Self-pay | Admitting: Family Medicine

## 2019-10-14 LAB — RENAL FUNCTION PANEL
Albumin: 4.4 g/dL (ref 3.8–4.8)
BUN/Creatinine Ratio: 20 (ref 12–28)
BUN: 12 mg/dL (ref 8–27)
CO2: 28 mmol/L (ref 20–29)
Calcium: 9.4 mg/dL (ref 8.7–10.3)
Chloride: 99 mmol/L (ref 96–106)
Creatinine, Ser: 0.61 mg/dL (ref 0.57–1.00)
GFR calc Af Amer: 113 mL/min/{1.73_m2} (ref >59)
GFR calc non Af Amer: 98 mL/min/{1.73_m2} (ref >59)
Glucose: 120 mg/dL — ABNORMAL HIGH (ref 65–99)
Phosphorus: 3.2 mg/dL (ref 3.0–4.3)
Potassium: 4.3 mmol/L (ref 3.5–5.2)
Sodium: 138 mmol/L (ref 134–144)

## 2019-10-14 LAB — LIPID PANEL WITH LDL/HDL RATIO
Cholesterol, Total: 151 mg/dL (ref 100–199)
HDL: 69 mg/dL (ref >39)
LDL Chol Calc (NIH): 70 mg/dL (ref 0–99)
LDL/HDL Ratio: 1 ratio (ref 0.0–3.2)
Triglycerides: 60 mg/dL (ref 0–149)
VLDL Cholesterol Cal: 12 mg/dL (ref 5–40)

## 2019-10-14 LAB — TSH: TSH: 0.095 u[IU]/mL — ABNORMAL LOW (ref 0.450–4.500)

## 2019-10-14 LAB — HEMOGLOBIN A1C
Est. average glucose Bld gHb Est-mCnc: 160 mg/dL
Hgb A1c MFr Bld: 7.2 % — ABNORMAL HIGH (ref 4.8–5.6)

## 2019-10-22 DIAGNOSIS — H26493 Other secondary cataract, bilateral: Secondary | ICD-10-CM | POA: Diagnosis not present

## 2019-10-22 DIAGNOSIS — H04123 Dry eye syndrome of bilateral lacrimal glands: Secondary | ICD-10-CM | POA: Diagnosis not present

## 2019-12-08 ENCOUNTER — Telehealth: Payer: Self-pay | Admitting: Family Medicine

## 2019-12-08 NOTE — Telephone Encounter (Unsigned)
Copied from CRM [id number]. Topic: General - Inquiry >> Dec 08, 2019  9:08 AM Leafy Ro wrote: Reason for CRM:pt is requesting tara to return her call. Pt wanted tara vm. Pt did not elaborate the reason for the call

## 2019-12-08 NOTE — Telephone Encounter (Signed)
I called pt back

## 2019-12-24 DIAGNOSIS — H02831 Dermatochalasis of right upper eyelid: Secondary | ICD-10-CM | POA: Diagnosis not present

## 2020-01-07 DIAGNOSIS — H02831 Dermatochalasis of right upper eyelid: Secondary | ICD-10-CM | POA: Diagnosis not present

## 2020-02-12 DIAGNOSIS — Z01812 Encounter for preprocedural laboratory examination: Secondary | ICD-10-CM | POA: Diagnosis not present

## 2020-02-12 DIAGNOSIS — Z20822 Contact with and (suspected) exposure to covid-19: Secondary | ICD-10-CM | POA: Diagnosis not present

## 2020-02-15 DIAGNOSIS — H57813 Brow ptosis, bilateral: Secondary | ICD-10-CM | POA: Diagnosis not present

## 2020-02-15 DIAGNOSIS — Z886 Allergy status to analgesic agent status: Secondary | ICD-10-CM | POA: Diagnosis not present

## 2020-02-15 DIAGNOSIS — Z880 Allergy status to penicillin: Secondary | ICD-10-CM | POA: Diagnosis not present

## 2020-02-15 DIAGNOSIS — E039 Hypothyroidism, unspecified: Secondary | ICD-10-CM | POA: Diagnosis not present

## 2020-02-15 DIAGNOSIS — E785 Hyperlipidemia, unspecified: Secondary | ICD-10-CM | POA: Diagnosis not present

## 2020-02-15 DIAGNOSIS — H02834 Dermatochalasis of left upper eyelid: Secondary | ICD-10-CM | POA: Diagnosis not present

## 2020-02-15 DIAGNOSIS — Z87891 Personal history of nicotine dependence: Secondary | ICD-10-CM | POA: Diagnosis not present

## 2020-02-15 DIAGNOSIS — Z79899 Other long term (current) drug therapy: Secondary | ICD-10-CM | POA: Diagnosis not present

## 2020-02-15 DIAGNOSIS — K219 Gastro-esophageal reflux disease without esophagitis: Secondary | ICD-10-CM | POA: Diagnosis not present

## 2020-02-15 DIAGNOSIS — Z9884 Bariatric surgery status: Secondary | ICD-10-CM | POA: Diagnosis not present

## 2020-02-15 DIAGNOSIS — Z79890 Hormone replacement therapy (postmenopausal): Secondary | ICD-10-CM | POA: Diagnosis not present

## 2020-02-15 DIAGNOSIS — H02831 Dermatochalasis of right upper eyelid: Secondary | ICD-10-CM | POA: Diagnosis not present

## 2020-02-15 DIAGNOSIS — J329 Chronic sinusitis, unspecified: Secondary | ICD-10-CM | POA: Diagnosis not present

## 2020-02-15 DIAGNOSIS — I10 Essential (primary) hypertension: Secondary | ICD-10-CM | POA: Diagnosis not present

## 2020-03-07 ENCOUNTER — Encounter: Payer: Self-pay | Admitting: Family Medicine

## 2020-03-07 ENCOUNTER — Other Ambulatory Visit: Payer: Self-pay

## 2020-03-07 ENCOUNTER — Ambulatory Visit (INDEPENDENT_AMBULATORY_CARE_PROVIDER_SITE_OTHER): Payer: BC Managed Care – PPO | Admitting: Family Medicine

## 2020-03-07 VITALS — BP 124/78 | HR 54 | Temp 97.8°F | Ht 63.0 in | Wt 174.0 lb

## 2020-03-07 DIAGNOSIS — H6501 Acute serous otitis media, right ear: Secondary | ICD-10-CM

## 2020-03-07 DIAGNOSIS — M940 Chondrocostal junction syndrome [Tietze]: Secondary | ICD-10-CM

## 2020-03-07 DIAGNOSIS — J0100 Acute maxillary sinusitis, unspecified: Secondary | ICD-10-CM | POA: Diagnosis not present

## 2020-03-07 DIAGNOSIS — R079 Chest pain, unspecified: Secondary | ICD-10-CM

## 2020-03-07 DIAGNOSIS — J301 Allergic rhinitis due to pollen: Secondary | ICD-10-CM

## 2020-03-07 MED ORDER — DOXYCYCLINE HYCLATE 100 MG PO TABS: 100.0000 mg | ORAL_TABLET | Freq: Two times a day (BID) | ORAL | 0 refills | Status: DC

## 2020-03-07 NOTE — Progress Notes (Signed)
Date:  03/07/2020   Name:  Phyllis Wagner   DOB:  [birth year]   MRN:  [id number]   Chief Complaint: Cough (2 weeks. No fever. Sneezing. Cough- clear production. Rare mucous. Hearing a slight whistling noise on and off when she breathes in her upper chest. ) and Ear Pain (Both ears - 2 days ago.)  Sinusitis This is a new problem. The current episode started 1 to 4 weeks ago. The problem has been waxing and waning since onset. There has been no fever. The pain is mild. Associated symptoms include chills, congestion, coughing, ear pain, headaches, a hoarse voice, sinus pressure and sneezing. Pertinent negatives include no diaphoresis, neck pain, shortness of breath, sore throat or swollen glands. Past treatments include oral decongestants. The treatment provided mild relief.  Otalgia  There is pain in both ears. The current episode started in the past 7 days. The problem has been waxing and waning. Associated symptoms include coughing and headaches. Pertinent negatives include no abdominal pain, diarrhea, ear discharge, hearing loss, neck pain, rash, rhinorrhea, sore throat or vomiting.  Cough This is a new problem. The current episode started 1 to 4 weeks ago. The problem has been waxing and waning. The cough is non-productive. Associated symptoms include chills, ear pain and headaches. Pertinent negatives include no chest pain, ear congestion, eye redness, fever, heartburn, hemoptysis, myalgias, postnasal drip, rash, rhinorrhea, sore throat, shortness of breath or wheezing. There is no history of environmental allergies.  Chest Pain  This is a new problem. The current episode started in the past 7 days. The problem occurs intermittently. The pain is present in the substernal region. The quality of the pain is described as heavy. The pain does not radiate. Associated symptoms include a cough and headaches. Pertinent negatives include no abdominal pain, back pain, diaphoresis, dizziness,  exertional chest pressure, fever, hemoptysis, leg pain, nausea, near-syncope, numbness, palpitations, shortness of breath, vomiting or weakness. The cough is non-productive.    Lab Results  Component Value Date   CREATININE 0.61 10/13/2019   BUN 12 10/13/2019   NA 138 10/13/2019   K 4.3 10/13/2019   CL 99 10/13/2019   CO2 28 10/13/2019   Lab Results  Component Value Date   CHOL 151 10/13/2019   HDL 69 10/13/2019   LDLCALC 70 10/13/2019   TRIG 60 10/13/2019   CHOLHDL 2.5 02/04/2018   Lab Results  Component Value Date   TSH 0.095 (L) 10/13/2019   Lab Results  Component Value Date   HGBA1C 7.2 (H) 10/13/2019   Lab Results  Component Value Date   WBC 5.7 01/14/2019   HGB 11.8 01/14/2019   HCT 35.4 01/14/2019   MCV 86 01/14/2019   PLT 256 01/14/2019   Lab Results  Component Value Date   ALT 13 05/28/2019   AST 22 05/28/2019   ALKPHOS 79 05/28/2019   BILITOT 0.3 05/28/2019     Review of Systems  Constitutional: Positive for chills. Negative for diaphoresis, fatigue, fever and unexpected weight change.  HENT: Positive for congestion, ear pain, hoarse voice, sinus pressure and sneezing. Negative for ear discharge, hearing loss, postnasal drip, rhinorrhea and sore throat.   Eyes: Negative for double vision, photophobia, pain, discharge, redness and itching.  Respiratory: Positive for cough. Negative for hemoptysis, shortness of breath, wheezing and stridor.   Cardiovascular: Negative for chest pain, palpitations and near-syncope.  Gastrointestinal: Negative for abdominal pain, blood in stool, constipation, diarrhea, heartburn, nausea and vomiting.  Endocrine: Negative for  cold intolerance, heat intolerance, polydipsia, polyphagia and polyuria.  Genitourinary: Negative for dysuria, flank pain, frequency, hematuria, menstrual problem, pelvic pain, urgency, vaginal bleeding and vaginal discharge.  Musculoskeletal: Negative for arthralgias, back pain, myalgias and neck pain.   Skin: Negative for rash.  Allergic/Immunologic: Negative for environmental allergies and food allergies.  Neurological: Positive for headaches. Negative for dizziness, weakness, light-headedness and numbness.  Hematological: Negative for adenopathy. Does not bruise/bleed easily.  Psychiatric/Behavioral: Negative for dysphoric mood. The patient is not nervous/anxious.     Patient Active Problem List   Diagnosis Date Noted  . Calculus of gallbladder without cholecystitis without obstruction 06/12/2017  . Chronic maxillary sinusitis 03/15/2015  . Chronic rhinitis 03/15/2015  . Mild intermittent asthma without complication 03/15/2015  . Oroantral fistula 03/15/2015  . Essential hypertension 01/05/2015  . Thyroid activity decreased 01/05/2015  . Hyperlipidemia 01/05/2015  . Recurrent major depressive disorder, in partial remission (HCC) 01/05/2015    Allergies  Allergen Reactions  . Aspirin Anaphylaxis, Hives and Shortness Of Breath  . Biaxin [Clarithromycin] Anaphylaxis, Hives and Shortness Of Breath  . Nsaids Hives and Swelling  . Penicillins Anaphylaxis, Hives and Swelling    Has patient had a PCN reaction causing immediate rash, facial/tongue/throat swelling, SOB or lightheadedness with hypotension:Yes Has patient had a PCN reaction causing severe rash involving mucus membranes or skin necrosis: Unknown Has patient had a PCN reaction that required hospitalization: Unknown Has patient had a PCN reaction occurring within the last 10 years: No Childhood reaction. If all of the above answers are "NO", then may proceed with Cephalosporin use.   . Sulfa Antibiotics Hives  . Ancef [Cefazolin] Rash    Past Surgical History:  Procedure Laterality Date  . CHOLECYSTECTOMY N/A 06/21/2017   Procedure: LAPAROSCOPIC CHOLECYSTECTOMY WITH INTRAOPERATIVE CHOLANGIOGRAM;  Surgeon: Earline Mayotte, MD;  Location: [facility] ORS;  Service: General;  Laterality: N/A;  . COLONOSCOPY  2012   cleared  for 10 yrs- [employer]  . EYE SURGERY Bilateral 2018  . GASTRIC BYPASS  2013   Dr E[facility]lene Hal at [facility]  . KNEE SURGERY Left   . LYMPH NODE DISSECTION    . TONSILLECTOMY    . UMBILICAL HERNIA REPAIR  06/21/2017   Procedure: HERNIA REPAIR UMBILICAL ADULT;  Surgeon: Earline Mayotte, MD;  Location: [facility] ORS;  Service: General;;  . VAGINAL HYSTERECTOMY     ovaries remain    Social History   Tobacco Use  . Smoking status: Former Smoker    Packs/day: 1.00    Years: 20.00    Pack years: 20.00    Types: Cigarettes    Quit date: 06/18/2010    Years since quitting: 9.7  . Smokeless tobacco: Never Used  Vaping Use  . Vaping Use: Never used  Substance Use Topics  . Alcohol use: Not Currently    Alcohol/week: 0.0 standard drinks  . Drug use: No     Medication list has been reviewed and updated.  Current Meds  Medication Sig  . APPLE CIDER VINEGAR PO Take 2 tablets by mouth daily.  Marland Kitchen ascorbic acid (VITAMIN C) 250 MG CHEW Chew 250 mg by mouth daily.  . cholecalciferol (VITAMIN D) 1000 UNITS tablet Take 1,000 Units by mouth daily.  Marland Kitchen CINNAMON PO Take 8,000 mg by mouth daily.  . citalopram (CELEXA) 20 MG tablet Take 1 tablet (20 mg total) by mouth daily.  . fluticasone (FLONASE) 50 MCG/ACT nasal spray Place 2 sprays into both nostrils daily.  . Glucosamine-Chondroitin (COSAMIN DS PO)  Take 2 tablets by mouth daily.  . hydrochlorothiazide (HYDRODIURIL) 25 MG tablet Take 1 tablet (25 mg total) by mouth daily.  Marland Kitchen levothyroxine (SYNTHROID) 75 MCG tablet One tablet daily  . metFORMIN (GLUCOPHAGE-XR) 500 MG 24 hr tablet TAKE 1 TABLET BY MOUTH EVERY DAY WITH BREAKFAST  . Omega-3 Fatty Acids (FISH OIL) 1000 MG CAPS Take 1 capsule (1,000 mg total) by mouth daily.  Bertram Gala Glycol-Propyl Glycol 0.4-0.3 % SOLN Place 1 drop into both eyes 3 (three) times daily as needed (for dry eyes.).  Marland Kitchen simvastatin (ZOCOR) 20 MG tablet Take 1 tablet (20 mg total) by mouth daily.  . vitamin B-12 (CYANOCOBALAMIN)  500 MCG tablet Take 500 mcg by mouth daily.    PHQ 2/9 Scores 03/07/2020 10/13/2019 05/28/2019 01/14/2019  PHQ - 2 Score 0 0 0 0  PHQ- 9 Score 0 0 0 4    GAD 7 : Generalized Anxiety Score 03/07/2020 10/13/2019 05/28/2019 01/14/2019  Nervous, Anxious, on Edge 0 0 0 0  Control/stop worrying 0 0 0 0  Worry too much - different things 0 0 0 0  Trouble relaxing 0 0 0 0  Restless 0 0 0 0  Easily annoyed or irritable 0 0 0 0  Afraid - awful might happen 0 0 0 0  Total GAD 7 Score 0 0 0 0  Anxiety Difficulty Not difficult at all Not difficult at all - -    BP Readings from Last 3 Encounters:  03/07/20 124/78  10/13/19 116/70  05/28/19 122/62    Physical Exam Vitals and nursing note reviewed.  Constitutional:      Appearance: She is well-developed and well-nourished.  HENT:     Head: Normocephalic.     Jaw: There is normal jaw occlusion.     Right Ear: External ear normal. A middle ear effusion is present.     Left Ear: External ear normal. Tympanic membrane is retracted.     Nose:     Right Turbinates: Not enlarged, swollen or pale.     Left Turbinates: Not enlarged, swollen or pale.     Right Sinus: Maxillary sinus tenderness present. No frontal sinus tenderness.     Left Sinus: Maxillary sinus tenderness present. No frontal sinus tenderness.     Mouth/Throat:     Mouth: Oropharynx is clear and moist.  Eyes:     General: Lids are everted, no foreign bodies appreciated. No scleral icterus.       Left eye: No foreign body or hordeolum.     Extraocular Movements: EOM normal.     Conjunctiva/sclera: Conjunctivae normal.     Right eye: Right conjunctiva is not injected.     Left eye: Left conjunctiva is not injected.     Pupils: Pupils are equal, round, and reactive to light.  Neck:     Thyroid: No thyromegaly.     Vascular: No JVD.     Trachea: No tracheal deviation.  Cardiovascular:     Rate and Rhythm: Normal rate and regular rhythm.     Pulses: Normal pulses and intact distal  pulses.     Heart sounds: Normal heart sounds, S1 normal and S2 normal. No murmur heard.  No systolic murmur is present.  No diastolic murmur is present. No friction rub. No gallop. No S3 or S4 sounds.   Pulmonary:     Effort: Pulmonary effort is normal. No tachypnea or respiratory distress.     Breath sounds: Normal breath sounds. No decreased breath sounds,  wheezing, rhonchi or rales.  Chest:     Chest wall: Tenderness present.    Abdominal:     General: Bowel sounds are normal.     Palpations: Abdomen is soft. There is no hepatosplenomegaly or mass.     Tenderness: There is no abdominal tenderness. There is no guarding or rebound.  Musculoskeletal:        General: No tenderness or edema. Normal range of motion.     Cervical back: Normal range of motion and neck supple.  Lymphadenopathy:     Cervical: No cervical adenopathy.  Skin:    General: Skin is warm.     Findings: No rash.  Neurological:     Mental Status: She is alert and oriented to person, place, and time.     Cranial Nerves: No cranial nerve deficit.     Deep Tendon Reflexes: Strength normal. Reflexes normal.  Psychiatric:        Mood and Affect: Mood and affect normal. Mood is not anxious or depressed.     Wt Readings from Last 3 Encounters:  03/07/20 174 lb (78.9 kg)  10/13/19 174 lb (78.9 kg)  05/28/19 177 lb (80.3 kg)    BP 124/78   Pulse (!) 54   Temp 97.8 F (36.6 C) (Oral)   Ht 5\' 3"  (1.6 m)   Wt 174 lb (78.9 kg)   SpO2 98%   BMI 30.82 kg/m   Assessment and Plan: 1. Chest pain, unspecified type New onset.  Episodic.  Stable.  Patient has had along the right costochondral margin discomfort which appeared shortly after having violent sneezing attacks.  Patient has tenderness along the area however given her risk factors we will do an EKG.I have reviewed EKG which shows as follows. Comparison to previous EKG dated 06/20/2017.  Results are unchanged from previous tracing.  Results as follows: Rate is  55 and sinus rhythm.  Intervals are normal.  There is no LVH criteria to suggest LVH.  There is no ischemic changes noted such as ST-T wave changes poor R wave progression or Q waves. - EKG 12-Lead  2. Costochondritis Patient with tenderness over the costochondral margin she is unable to take NSAIDs secondary to aspirin allergy.  Patient will use Tylenol for pain control.  3. Seasonal allergic rhinitis due to pollen Chronic.  Episodic.  Stable.  Patient's been instructed to pick up some Sudafed 30 mg over-the-counter.  4. Acute non-recurrent maxillary sinusitis Acute.  Persistent.  Stable.  Exam and history is consistent with maxillary sinus infection.  We will treat with doxycycline 100 mg twice a day. - doxycycline (VIBRA-TABS) 100 MG tablet; Take 1 tablet (100 mg total) by mouth 2 (two) times daily.  Dispense: 20 tablet; Refill: 0  5. Non-recurrent acute serous otitis media of right ear Patient has retracted TM on the left and a serous otitis any on the right which would also go along with allergies and viral URI.  Patient's been instructed to go with over-the-counter decongestants.

## 2020-03-11 ENCOUNTER — Other Ambulatory Visit: Payer: Self-pay | Admitting: Family Medicine

## 2020-03-11 DIAGNOSIS — I10 Essential (primary) hypertension: Secondary | ICD-10-CM

## 2020-03-24 ENCOUNTER — Telehealth: Payer: Self-pay

## 2020-03-24 NOTE — Telephone Encounter (Signed)
Please call and tell her she needs to be rechecked and possibly have a chest xray- can see her today or tomorrow

## 2020-03-24 NOTE — Telephone Encounter (Signed)
Copied from CRM [id number]. Topic: Quick Communication - See Telephone Encounter >> Mar 24, 2020  8:12 AM Aretta Nip wrote: CRM for notification. See Telephone encounter for: 03/24/20. Pt seen on the 27th finished meds no better except the ears, head congestion is no better, still sore throat but goes away later in morning, wondering about steroids> Wants a FU call, has FU appt 2/3/ [phone]

## 2020-03-25 ENCOUNTER — Ambulatory Visit (INDEPENDENT_AMBULATORY_CARE_PROVIDER_SITE_OTHER): Payer: BC Managed Care – PPO | Admitting: Family Medicine

## 2020-03-25 ENCOUNTER — Encounter: Payer: Self-pay | Admitting: Family Medicine

## 2020-03-25 ENCOUNTER — Ambulatory Visit
Admission: RE | Admit: 2020-03-25 | Discharge: 2020-03-25 | Disposition: A | Discharge status: Home / Self Care | Payer: BC Managed Care – PPO | Source: Ambulatory Visit | Attending: Family Medicine | Admitting: Family Medicine

## 2020-03-25 ENCOUNTER — Ambulatory Visit
Admission: RE | Admit: 2020-03-25 | Discharge: 2020-03-25 | Disposition: A | Discharge status: Home / Self Care | Payer: BC Managed Care – PPO | Attending: Family Medicine | Admitting: Family Medicine

## 2020-03-25 ENCOUNTER — Other Ambulatory Visit: Payer: Self-pay

## 2020-03-25 VITALS — BP 130/70 | HR 68 | Temp 98.3°F | Ht 63.0 in | Wt 174.0 lb

## 2020-03-25 DIAGNOSIS — J4520 Mild intermittent asthma, uncomplicated: Secondary | ICD-10-CM | POA: Diagnosis not present

## 2020-03-25 DIAGNOSIS — U099 Post covid-19 condition, unspecified: Secondary | ICD-10-CM

## 2020-03-25 DIAGNOSIS — Z0389 Encounter for observation for other suspected diseases and conditions ruled out: Secondary | ICD-10-CM | POA: Diagnosis not present

## 2020-03-25 DIAGNOSIS — J0100 Acute maxillary sinusitis, unspecified: Secondary | ICD-10-CM | POA: Diagnosis not present

## 2020-03-25 DIAGNOSIS — M4184 Other forms of scoliosis, thoracic region: Secondary | ICD-10-CM | POA: Diagnosis not present

## 2020-03-25 DIAGNOSIS — M47814 Spondylosis without myelopathy or radiculopathy, thoracic region: Secondary | ICD-10-CM | POA: Diagnosis not present

## 2020-03-25 MED ORDER — ALBUTEROL SULFATE HFA 108 (90 BASE) MCG/ACT IN AERS: 2.0000 | INHALATION_SPRAY | Freq: Four times a day (QID) | RESPIRATORY_TRACT | 2 refills | Status: DC | PRN

## 2020-03-25 MED ORDER — MONTELUKAST SODIUM 10 MG PO TABS: 10.0000 mg | ORAL_TABLET | Freq: Every day | ORAL | 3 refills | Status: DC

## 2020-03-25 MED ORDER — DOXYCYCLINE HYCLATE 100 MG PO TABS: 100.0000 mg | ORAL_TABLET | Freq: Two times a day (BID) | ORAL | 0 refills | Status: DC

## 2020-03-25 NOTE — Progress Notes (Signed)
Date:  03/25/2020   Name:  Phyllis Wagner   DOB:  03/18/1955   MRN:  [id number]   Chief Complaint: Sinusitis (Head pressure, drainage, ears hurting, dry cough, hurts to take a deep breath, no SOB)  Sinusitis This is a recurrent problem. The current episode started 1 to 4 weeks ago. The problem has been gradually improving since onset. There has been no fever. The pain is mild. Associated symptoms include chills, congestion, coughing, headaches, sinus pressure, sneezing and a sore throat. Pertinent negatives include no diaphoresis, ear pain, hoarse voice, neck pain, shortness of breath or swollen glands. Past treatments include antibiotics (doxycycline). The treatment provided mild relief.  Diabetes She presents for her follow-up diabetic visit. She has type 2 diabetes mellitus. Her disease course has been stable. Hypoglycemia symptoms include headaches. Associated symptoms include chest pain. Pertinent negatives for diabetes include no blurred vision, no fatigue and no foot paresthesias. (Chest wall pain) Symptoms are stable. She is following a generally healthy diet.    Lab Results  Component Value Date   CREATININE 0.61 10/13/2019   BUN 12 10/13/2019   NA 138 10/13/2019   K 4.3 10/13/2019   CL 99 10/13/2019   CO2 28 10/13/2019   Lab Results  Component Value Date   CHOL 151 10/13/2019   HDL 69 10/13/2019   LDLCALC 70 10/13/2019   TRIG 60 10/13/2019   CHOLHDL 2.5 02/04/2018   Lab Results  Component Value Date   TSH 0.095 (L) 10/13/2019   Lab Results  Component Value Date   HGBA1C 7.2 (H) 10/13/2019   Lab Results  Component Value Date   WBC 5.7 01/14/2019   HGB 11.8 01/14/2019   HCT 35.4 01/14/2019   MCV 86 01/14/2019   PLT 256 01/14/2019   Lab Results  Component Value Date   ALT 13 05/28/2019   AST 22 05/28/2019   ALKPHOS 79 05/28/2019   BILITOT 0.3 05/28/2019     Review of Systems  Constitutional: Positive for chills. Negative for diaphoresis and  fatigue.  HENT: Positive for congestion, sinus pressure, sneezing and sore throat. Negative for ear pain and hoarse voice.   Eyes: Negative for blurred vision.  Respiratory: Positive for cough. Negative for shortness of breath.   Cardiovascular: Positive for chest pain.  Musculoskeletal: Negative for neck pain.  Neurological: Positive for headaches.    Patient Active Problem List   Diagnosis Date Noted  . Calculus of gallbladder without cholecystitis without obstruction 06/12/2017  . Chronic maxillary sinusitis 03/15/2015  . Chronic rhinitis 03/15/2015  . Mild intermittent asthma without complication 03/15/2015  . Oroantral fistula 03/15/2015  . Essential hypertension 01/05/2015  . Thyroid activity decreased 01/05/2015  . Hyperlipidemia 01/05/2015  . Recurrent major depressive disorder, in partial remission (HCC) 01/05/2015    Allergies  Allergen Reactions  . Aspirin Anaphylaxis, Hives and Shortness Of Breath  . Biaxin [Clarithromycin] Anaphylaxis, Hives and Shortness Of Breath  . Nsaids Hives and Swelling  . Penicillins Anaphylaxis, Hives and Swelling    Has patient had a PCN reaction causing immediate rash, facial/tongue/throat swelling, SOB or lightheadedness with hypotension:Yes Has patient had a PCN reaction causing severe rash involving mucus membranes or skin necrosis: Unknown Has patient had a PCN reaction that required hospitalization: Unknown Has patient had a PCN reaction occurring within the last 10 years: No Childhood reaction. If all of the above answers are "NO", then may proceed with Cephalosporin use.   . Sulfa Antibiotics Hives  . Ancef [Cefazolin]  Rash    Past Surgical History:  Procedure Laterality Date  . CHOLECYSTECTOMY N/A 06/21/2017   Procedure: LAPAROSCOPIC CHOLECYSTECTOMY WITH INTRAOPERATIVE CHOLANGIOGRAM;  Surgeon: Earline MayotteByrnett, Jeffrey W, MD;  Location: [facility] ORS;  Service: General;  Laterality: N/A;  . COLONOSCOPY  2012   cleared [facility] yrs- [employer]  [facility]  . EYE SURGERY Bilateral 2018  . GASTRIC BYPASS  2013   [facility]  . KNEE SURGERY Left   . LYMPH NODE DISSECTION    . TONSILLECTOMY    . UMBILICAL HERNIA REPAIR  06/21/2017   Procedure: HERNIA REPAIR UMBILICAL ADULT;  Surgeon: Earline MayotteByrnett, Jeffrey W, MD;  Location: [facility] ORS;  Service: General;;  . VAGINAL HYSTERECTOMY     ovaries remain    Social History   Tobacco Use  . Smoking status: Former Smoker    Packs/day: 1.00    Years: 20.00    Pack years: 20.00    Types: Cigarettes    Quit date: 06/18/2010    Years since quitting: 9.7  . Smokeless tobacco: Never Used  Vaping Use  . Vaping Use: Never used  Substance Use Topics  . Alcohol use: Not Currently    Alcohol/week: 0.0 standard drinks  . Drug use: No     Medication list has been reviewed and updated.  Current Meds  Medication Sig  . APPLE CIDER VINEGAR PO Take 2 tablets by mouth daily.  Marland Kitchen. ascorbic acid (VITAMIN C) 250 MG CHEW Chew 250 mg by mouth daily.  . cholecalciferol (VITAMIN D) 1000 UNITS tablet Take 1,000 Units by mouth daily.  Marland Kitchen. CINNAMON PO Take 8,000 mg by mouth daily.  . citalopram (CELEXA) 20 MG tablet Take 1 tablet (20 mg total) by mouth daily.  . fluticasone (FLONASE) 50 MCG/ACT nasal spray Place 2 sprays into both nostrils daily.  . Glucosamine-Chondroitin (COSAMIN DS PO) Take 2 tablets by mouth daily.  . hydrochlorothiazide (HYDRODIURIL) 25 MG tablet TAKE 1 TABLET BY MOUTH EVERY DAY  . levothyroxine (SYNTHROID) 75 MCG tablet One tablet daily  . metFORMIN (GLUCOPHAGE-XR) 500 MG 24 hr tablet TAKE 1 TABLET BY MOUTH EVERY DAY WITH BREAKFAST  . Omega-3 Fatty Acids (FISH OIL) 1000 MG CAPS Take 1 capsule (1,000 mg total) by mouth daily.  Bertram Gala. Polyethyl Glycol-Propyl Glycol 0.4-0.3 % SOLN Place 1 drop into both eyes 3 (three) times daily as needed (for dry eyes.).  Marland Kitchen. simvastatin (ZOCOR) 20 MG tablet Take 1 tablet (20 mg total) by mouth daily.  . vitamin B-12 (CYANOCOBALAMIN) 500 MCG tablet Take 500 mcg by  mouth daily.    PHQ 2/9 Scores 03/07/2020 10/13/2019 05/28/2019 01/14/2019  PHQ - 2 Score 0 0 0 0  PHQ- 9 Score 0 0 0 4    GAD 7 : Generalized Anxiety Score 03/07/2020 10/13/2019 05/28/2019 01/14/2019  Nervous, Anxious, on Edge 0 0 0 0  Control/stop worrying 0 0 0 0  Worry too much - different things 0 0 0 0  Trouble relaxing 0 0 0 0  Restless 0 0 0 0  Easily annoyed or irritable 0 0 0 0  Afraid - awful might happen 0 0 0 0  Total GAD 7 Score 0 0 0 0  Anxiety Difficulty Not difficult at all Not difficult at all - -    BP Readings from Last 3 Encounters:  03/25/20 130/70  03/07/20 124/78  10/13/19 116/70    Physical Exam Vitals and nursing note reviewed.  Constitutional:      General: She is not  in acute distress.    Appearance: She is not diaphoretic.  HENT:     Head: Normocephalic and atraumatic.     Right Ear: Tympanic membrane, ear canal and external ear normal. There is no impacted cerumen.     Left Ear: Tympanic membrane, ear canal and external ear normal. There is no impacted cerumen.     Nose: Congestion present. No rhinorrhea.     Mouth/Throat:     Mouth: Oropharynx is clear and moist. Mucous membranes are moist.  Eyes:     General:        Right eye: No discharge.        Left eye: No discharge.     Extraocular Movements: EOM normal.     Conjunctiva/sclera: Conjunctivae normal.     Pupils: Pupils are equal, round, and reactive to light.  Neck:     Thyroid: No thyromegaly.     Vascular: No JVD.  Cardiovascular:     Rate and Rhythm: Normal rate and regular rhythm.     Pulses: Intact distal pulses.     Heart sounds: Normal heart sounds. No murmur heard. No friction rub. No gallop.   Pulmonary:     Effort: Pulmonary effort is normal. No respiratory distress.     Breath sounds: Normal breath sounds. No wheezing, rhonchi or rales.  Abdominal:     General: Bowel sounds are normal.     Palpations: Abdomen is soft. There is no mass.     Tenderness: There is no  abdominal tenderness. There is no guarding.  Musculoskeletal:        General: No edema. Normal range of motion.     Cervical back: Normal range of motion and neck supple.  Lymphadenopathy:     Cervical: No cervical adenopathy.  Skin:    General: Skin is warm and dry.  Neurological:     Mental Status: She is alert.     Deep Tendon Reflexes: Reflexes are normal and symmetric.     Wt Readings from Last 3 Encounters:  03/25/20 174 lb (78.9 kg)  03/07/20 174 lb (78.9 kg)  10/13/19 174 lb (78.9 kg)    BP 130/70   Pulse 68   Temp 98.3 F (36.8 C) (Oral)   Ht 5\' 3"  (1.6 m)   Wt 174 lb (78.9 kg)   SpO2 98%   BMI 30.82 kg/m   Assessment and Plan: 1. Acute non-recurrent maxillary sinusitis Acute.  Recurrent.  Patient continues to have upper respiratory congestion with mild nasal discharge.  We will refill doxycycline 100 mg twice a day. - doxycycline (VIBRA-TABS) 100 MG tablet; Take 1 tablet (100 mg total) by mouth 2 (two) times daily.  Dispense: 20 tablet; Refill: 0  2. Mild intermittent reactive airway disease without complication Chronic.  Controlled.  Stable.  Patient has an increased expiratory to inspiratory ratio.  We will get a chest x-ray to see what his current status of her lungs he has and will refill albuterol inhaler 1 to 2 puffs every 6 hours and add Singulair 10 mg once a day. - DG Chest 2 View; Future - albuterol (VENTOLIN HFA) 108 (90 Base) MCG/ACT inhaler; Inhale 2 puffs into the lungs every 6 (six) hours as needed for wheezing or shortness of breath.  Dispense: 8 g; Refill: 2 - montelukast (SINGULAIR) 10 MG tablet; Take 1 tablet (10 mg total) by mouth at bedtime.  Dispense: 30 tablet; Refill: 3  3. Post-COVID syndrome Patient may or may not have had COVID  over December but we will check his an x-ray is been an extended period of time since she is quit smoking and there may be some evidence of COPD or other pulmonary concerns noted. - DG Chest 2 View; Future

## 2020-03-25 NOTE — Patient Instructions (Signed)

## 2020-04-12 ENCOUNTER — Encounter: Payer: Self-pay | Admitting: Family Medicine

## 2020-04-14 ENCOUNTER — Other Ambulatory Visit: Payer: Self-pay

## 2020-04-14 ENCOUNTER — Ambulatory Visit (INDEPENDENT_AMBULATORY_CARE_PROVIDER_SITE_OTHER): Payer: BC Managed Care – PPO | Admitting: Family Medicine

## 2020-04-14 ENCOUNTER — Encounter: Payer: Self-pay | Admitting: Family Medicine

## 2020-04-14 VITALS — BP 104/62 | HR 76 | Ht 63.0 in | Wt 172.0 lb

## 2020-04-14 DIAGNOSIS — E039 Hypothyroidism, unspecified: Secondary | ICD-10-CM | POA: Diagnosis not present

## 2020-04-14 DIAGNOSIS — R7303 Prediabetes: Secondary | ICD-10-CM

## 2020-04-14 DIAGNOSIS — R5383 Other fatigue: Secondary | ICD-10-CM

## 2020-04-14 DIAGNOSIS — E782 Mixed hyperlipidemia: Secondary | ICD-10-CM | POA: Diagnosis not present

## 2020-04-14 DIAGNOSIS — I10 Essential (primary) hypertension: Secondary | ICD-10-CM | POA: Diagnosis not present

## 2020-04-14 DIAGNOSIS — R5381 Other malaise: Secondary | ICD-10-CM | POA: Diagnosis not present

## 2020-04-14 DIAGNOSIS — F3341 Major depressive disorder, recurrent, in partial remission: Secondary | ICD-10-CM

## 2020-04-14 DIAGNOSIS — K130 Diseases of lips: Secondary | ICD-10-CM

## 2020-04-14 MED ORDER — LEVOTHYROXINE SODIUM 75 MCG PO TABS: ORAL_TABLET | ORAL | 1 refills | Status: DC

## 2020-04-14 MED ORDER — HYDROCHLOROTHIAZIDE 25 MG PO TABS: 25.0000 mg | ORAL_TABLET | Freq: Every day | ORAL | 1 refills | Status: DC

## 2020-04-14 MED ORDER — SIMVASTATIN 20 MG PO TABS
20.0000 mg | ORAL_TABLET | Freq: Every day | ORAL | 1 refills | Status: DC
Start: 2020-04-14 — End: 2020-11-03

## 2020-04-14 MED ORDER — CLOTRIMAZOLE-BETAMETHASONE 1-0.05 % EX CREA: 1.0000 "application " | TOPICAL_CREAM | Freq: Two times a day (BID) | CUTANEOUS | 0 refills | Status: DC

## 2020-04-14 MED ORDER — METFORMIN HCL ER 500 MG PO TB24
ORAL_TABLET | ORAL | 1 refills | Status: DC
Start: 2020-04-14 — End: 2020-10-18

## 2020-04-14 MED ORDER — CITALOPRAM HYDROBROMIDE 20 MG PO TABS: 20.0000 mg | ORAL_TABLET | Freq: Every day | ORAL | 1 refills | Status: DC

## 2020-04-14 NOTE — Progress Notes (Signed)
Date:  04/14/2020   Name:  Phyllis HusbandsKathleen Boyer Degidio   DOB:  1956/01/17   MRN:  [id number]   Chief Complaint: Hyperlipidemia, Diabetes, Hypothyroidism, Depression, and Hypertension  Hyperlipidemia This is a chronic problem. The current episode started more than 1 year ago. The problem is controlled. Recent lipid tests were reviewed and are normal. She has no history of chronic renal disease, diabetes, hypothyroidism, liver disease, obesity or nephrotic syndrome. There are no known factors aggravating her hyperlipidemia. Pertinent negatives include no chest pain, focal sensory loss, focal weakness, leg pain, myalgias or shortness of breath. Current antihyperlipidemic treatment includes statins. The current treatment provides moderate improvement of lipids. There are no compliance problems.  Risk factors for coronary artery disease include dyslipidemia, diabetes mellitus and hypertension.  Diabetes She presents for her follow-up diabetic visit. She has type 2 diabetes mellitus. Her disease course has been fluctuating. There are no hypoglycemic associated symptoms. Pertinent negatives for hypoglycemia include no dizziness, headaches, nervousness/anxiousness, sweats or tremors. Pertinent negatives for diabetes include no blurred vision, no chest pain, no fatigue, no foot paresthesias, no foot ulcerations, no polydipsia, no polyphagia, no polyuria, no visual change, no weakness and no weight loss. There are no hypoglycemic complications. Symptoms are stable. There are no diabetic complications. There are no known risk factors for coronary artery disease. Current diabetic treatment includes oral agent (monotherapy). She is compliant with treatment all of the time. She is following a generally healthy diet. Meal planning includes avoidance of concentrated sweets and carbohydrate counting. She participates in exercise daily. Her breakfast blood glucose is taken between 8-9 am. Her breakfast blood glucose range  is generally 90-110 mg/dl.  Depression        This is a chronic problem.  The current episode started more than 1 year ago.   The onset quality is gradual.   The problem occurs intermittently.  The problem has been gradually improving since onset.  Associated symptoms include no decreased concentration, no fatigue, no helplessness, no hopelessness, does not have insomnia, not irritable, no restlessness, no decreased interest, no appetite change, no body aches, no myalgias, no headaches, no indigestion, not sad and no suicidal ideas.     The symptoms are aggravated by nothing.  Past treatments include SSRIs - Selective serotonin reuptake inhibitors.  Compliance with treatment is good.  Past medical history includes thyroid problem.     Pertinent negatives include no hypothyroidism and no anxiety. Hypertension This is a chronic problem. The current episode started more than 1 year ago. The problem has been gradually improving since onset. The problem is controlled. Pertinent negatives include no anxiety, blurred vision, chest pain, headaches, malaise/fatigue, neck pain, orthopnea, palpitations, peripheral edema, PND, shortness of breath or sweats. Past treatments include diuretics. Identifiable causes of hypertension include a thyroid problem. There is no history of chronic renal disease.  Thyroid Problem Presents for follow-up visit. Patient reports no anxiety, cold intolerance, constipation, depressed mood, diaphoresis, diarrhea, dry skin, fatigue, hair loss, heat intolerance, hoarse voice, leg swelling, menstrual problem, nail problem, palpitations, tremors, visual change, weight gain or weight loss. The symptoms have been stable. Her past medical history is significant for hyperlipidemia. There is no history of diabetes.    Lab Results  Component Value Date   CREATININE 0.61 10/13/2019   BUN 12 10/13/2019   NA 138 10/13/2019   K 4.3 10/13/2019   CL 99 10/13/2019   CO2 28 10/13/2019   Lab Results   Component Value Date  CHOL 151 10/13/2019   HDL 69 10/13/2019   LDLCALC 70 10/13/2019   TRIG 60 10/13/2019   CHOLHDL 2.5 02/04/2018   Lab Results  Component Value Date   TSH 0.095 (L) 10/13/2019   Lab Results  Component Value Date   HGBA1C 7.2 (H) 10/13/2019   Lab Results  Component Value Date   WBC 5.7 01/14/2019   HGB 11.8 01/14/2019   HCT 35.4 01/14/2019   MCV 86 01/14/2019   PLT 256 01/14/2019   Lab Results  Component Value Date   ALT 13 05/28/2019   AST 22 05/28/2019   ALKPHOS 79 05/28/2019   BILITOT 0.3 05/28/2019     Review of Systems  Constitutional: Negative.  Negative for appetite change, chills, diaphoresis, fatigue, fever, malaise/fatigue, unexpected weight change, weight gain and weight loss.  HENT: Negative for congestion, ear discharge, ear pain, hoarse voice, rhinorrhea, sinus pressure, sneezing and sore throat.   Eyes: Negative for blurred vision, double vision, photophobia, pain, discharge, redness and itching.  Respiratory: Negative for cough, shortness of breath, wheezing and stridor.   Cardiovascular: Negative for chest pain, palpitations, orthopnea and PND.  Gastrointestinal: Negative for abdominal pain, blood in stool, constipation, diarrhea, nausea and vomiting.  Endocrine: Negative for cold intolerance, heat intolerance, polydipsia, polyphagia and polyuria.  Genitourinary: Negative for dysuria, flank pain, frequency, hematuria, menstrual problem, pelvic pain, urgency, vaginal bleeding and vaginal discharge.  Musculoskeletal: Negative for arthralgias, back pain, myalgias and neck pain.  Skin: Negative for rash.  Allergic/Immunologic: Negative for environmental allergies and food allergies.  Neurological: Negative for dizziness, tremors, focal weakness, weakness, light-headedness, numbness and headaches.  Hematological: Negative for adenopathy. Does not bruise/bleed easily.  Psychiatric/Behavioral: Positive for depression. Negative for decreased  concentration, dysphoric mood and suicidal ideas. The patient is not nervous/anxious and does not have insomnia.     Patient Active Problem List   Diagnosis Date Noted  . Calculus of gallbladder without cholecystitis without obstruction 06/12/2017  . Chronic maxillary sinusitis 03/15/2015  . Chronic rhinitis 03/15/2015  . Mild intermittent asthma without complication 03/15/2015  . Oroantral fistula 03/15/2015  . Essential hypertension 01/05/2015  . Thyroid activity decreased 01/05/2015  . Hyperlipidemia 01/05/2015  . Recurrent major depressive disorder, in partial remission (HCC) 01/05/2015    Allergies  Allergen Reactions  . Aspirin Anaphylaxis, Hives and Shortness Of Breath  . Biaxin [Clarithromycin] Anaphylaxis, Hives and Shortness Of Breath  . Nsaids Hives and Swelling  . Penicillins Anaphylaxis, Hives and Swelling    Has patient had a PCN reaction causing immediate rash, facial/tongue/throat swelling, SOB or lightheadedness with hypotension:Yes Has patient had a PCN reaction causing severe rash involving mucus membranes or skin necrosis: Unknown Has patient had a PCN reaction that required hospitalization: Unknown Has patient had a PCN reaction occurring within the last 10 years: No Childhood reaction. If all of the above answers are "NO", then may proceed with Cephalosporin use.   . Sulfa Antibiotics Hives  . Ancef [Cefazolin] Rash    Past Surgical History:  Procedure Laterality Date  . CHOLECYSTECTOMY N/A 06/21/2017   Procedure: LAPAROSCOPIC CHOLECYSTECTOMY WITH INTRAOPERATIVE CHOLANGIOGRAM;  Surgeon: Earline Mayotte, MD;  Location: [facility] ORS;  Service: General;  Laterality: N/A;  . COLONOSCOPY  2012   cleared for 10 yrs- [employer]  . EYE SURGERY Bilateral 2018  . GASTRIC BYPASS  2013   Dr Ethelene Hal at [facility]  . KNEE SURGERY Left   . LYMPH NODE DISSECTION    . TONSILLECTOMY    .  UMBILICAL HERNIA REPAIR  06/21/2017   Procedure: HERNIA REPAIR UMBILICAL ADULT;  Surgeon:  Earline Mayotte, MD;  Location: [facility] ORS;  Service: General;;  . VAGINAL HYSTERECTOMY     ovaries remain    Social History   Tobacco Use  . Smoking status: Former Smoker    Packs/day: 1.00    Years: 20.00    Pack years: 20.00    Types: Cigarettes    Quit date: 06/18/2010    Years since quitting: 9.8  . Smokeless tobacco: Never Used  Vaping Use  . Vaping Use: Never used  Substance Use Topics  . Alcohol use: Not Currently    Alcohol/week: 0.0 standard drinks  . Drug use: No     Medication list has been reviewed and updated.  Current Meds  Medication Sig  . albuterol (VENTOLIN HFA) 108 (90 Base) MCG/ACT inhaler Inhale 2 puffs into the lungs every 6 (six) hours as needed for wheezing or shortness of breath.  . APPLE CIDER VINEGAR PO Take 2 tablets by mouth daily.  Marland Kitchen ascorbic acid (VITAMIN C) 250 MG CHEW Chew 250 mg by mouth daily.  . cholecalciferol (VITAMIN D) 1000 UNITS tablet Take 1,000 Units by mouth daily.  Marland Kitchen CINNAMON PO Take 8,000 mg by mouth daily.  . citalopram (CELEXA) 20 MG tablet Take 1 tablet (20 mg total) by mouth daily.  . fluticasone (FLONASE) 50 MCG/ACT nasal spray Place 2 sprays into both nostrils daily.  . Glucosamine-Chondroitin (COSAMIN DS PO) Take 2 tablets by mouth daily.  . hydrochlorothiazide (HYDRODIURIL) 25 MG tablet TAKE 1 TABLET BY MOUTH EVERY DAY  . levothyroxine (SYNTHROID) 75 MCG tablet One tablet daily  . metFORMIN (GLUCOPHAGE-XR) 500 MG 24 hr tablet TAKE 1 TABLET BY MOUTH EVERY DAY WITH BREAKFAST  . montelukast (SINGULAIR) 10 MG tablet Take 1 tablet (10 mg total) by mouth at bedtime.  . Omega-3 Fatty Acids (FISH OIL) 1000 MG CAPS Take 1 capsule (1,000 mg total) by mouth daily.  Bertram Gala Glycol-Propyl Glycol 0.4-0.3 % SOLN Place 1 drop into both eyes 3 (three) times daily as needed (for dry eyes.).  Marland Kitchen simvastatin (ZOCOR) 20 MG tablet Take 1 tablet (20 mg total) by mouth daily.  . vitamin B-12 (CYANOCOBALAMIN) 500 MCG tablet Take 500 mcg by  mouth daily.    PHQ 2/9 Scores 04/14/2020 03/07/2020 10/13/2019 05/28/2019  PHQ - 2 Score 0 0 0 0  PHQ- 9 Score 0 0 0 0    GAD 7 : Generalized Anxiety Score 04/14/2020 03/07/2020 10/13/2019 05/28/2019  Nervous, Anxious, on Edge 0 0 0 0  Control/stop worrying 0 0 0 0  Worry too much - different things 0 0 0 0  Trouble relaxing 0 0 0 0  Restless 0 0 0 0  Easily annoyed or irritable 0 0 0 0  Afraid - awful might happen 0 0 0 0  Total GAD 7 Score 0 0 0 0  Anxiety Difficulty - Not difficult at all Not difficult at all -    BP Readings from Last 3 Encounters:  04/14/20 104/62  03/25/20 130/70  03/07/20 124/78    Physical Exam Constitutional:      General: She is not irritable.She is not in acute distress.    Appearance: She is not diaphoretic.  HENT:     Head: Normocephalic and atraumatic.     Right Ear: External ear normal.     Left Ear: Tympanic membrane and external ear normal.     Nose: Nose normal. No  congestion or rhinorrhea.     Mouth/Throat:     Mouth: Oropharynx is clear and moist. Mucous membranes are moist.     Pharynx: Oropharynx is clear.  Eyes:     General:        Right eye: No discharge.        Left eye: No discharge.     Extraocular Movements: EOM normal.     Conjunctiva/sclera: Conjunctivae normal.     Pupils: Pupils are equal, round, and reactive to light.  Neck:     Thyroid: No thyromegaly.     Vascular: No JVD.  Cardiovascular:     Rate and Rhythm: Normal rate and regular rhythm.     Pulses: Intact distal pulses.     Heart sounds: Normal heart sounds. No murmur heard. No friction rub. No gallop.   Pulmonary:     Effort: Pulmonary effort is normal.     Breath sounds: Normal breath sounds. No wheezing or rhonchi.  Abdominal:     General: Bowel sounds are normal. There is no distension.     Palpations: Abdomen is soft. There is no hepatomegaly, splenomegaly or mass.     Tenderness: There is no abdominal tenderness. There is no guarding or rebound.      Hernia: No hernia is present.  Musculoskeletal:        General: No edema. Normal range of motion.     Cervical back: Normal range of motion and neck supple.  Lymphadenopathy:     Cervical: No cervical adenopathy.  Skin:    General: Skin is warm and dry.     Capillary Refill: Capillary refill takes less than 2 seconds.  Neurological:     Mental Status: She is alert.     Deep Tendon Reflexes: Reflexes are normal and symmetric.     Wt Readings from Last 3 Encounters:  04/14/20 172 lb (78 kg)  03/25/20 174 lb (78.9 kg)  03/07/20 174 lb (78.9 kg)    BP 104/62   Pulse 76   Ht 5\' 3"  (1.6 m)   Wt 172 lb (78 kg)   BMI 30.47 kg/m   Assessment and Plan: 1. Essential hypertension Chronic.  Controlled.  Stable.  Blood pressure today is 104 over 62.  Patient will continue hydrochlorothiazide 25 mg daily we will check CMP for electrolytes and GFR. - hydrochlorothiazide (HYDRODIURIL) 25 MG tablet; Take 1 tablet (25 mg total) by mouth daily.  Dispense: 90 tablet; Refill: 1 - Comprehensive Metabolic Panel (CMET)  2. Malaise and fatigue Recurrent malaise and fatigue has multiple reasons including hypothyroid and depression.  Patient also has pica if she eats ice 6 glasses a day.  Will check a CBC to see where the possibility of anemia may be. - citalopram (CELEXA) 20 MG tablet; Take 1 tablet (20 mg total) by mouth daily.  Dispense: 90 tablet; Refill: 1 - levothyroxine (SYNTHROID) 75 MCG tablet; One tablet daily  Dispense: 90 tablet; Refill: 1 - CBC with Differential/Platelet  3. Recurrent major depressive disorder, in partial remission (HCC) Chronic.  Controlled.  Stable.  PHQ is 0 Gad score 0 patient will continue citalopram 20 mg once a day. - citalopram (CELEXA) 20 MG tablet; Take 1 tablet (20 mg total) by mouth daily.  Dispense: 90 tablet; Refill: 1  4. Hypothyroidism, unspecified type Chronic.  Stable.  Controlled.  Will continue levothyroxine 75 mg once a day.  Will continue pending  thyroid panel. - levothyroxine (SYNTHROID) 75 MCG tablet; One tablet daily  Dispense:  90 tablet; Refill: 1 - Thyroid Panel With TSH  5. Prediabetes Chronic.  Controlled.  Stable.  Patient is prediabetic with A1c's have been acceptable in the past.  Patient is currently on Glucophage Exar 500 mg daily and tolerating and doing well - metFORMIN (GLUCOPHAGE-XR) 500 MG 24 hr tablet; TAKE 1 TABLET BY MOUTH EVERY DAY WITH BREAKFAST  Dispense: 90 tablet; Refill: 1 - HgB A1c - Microalbumin, urine  6. Mixed hyperlipidemia Chronic.  Controlled.  Stable.  Continue simvastatin 20 mg once a day.  Will check lipid panel for current status of LDL and triglycerides. - simvastatin (ZOCOR) 20 MG tablet; Take 1 tablet (20 mg total) by mouth daily.  Dispense: 90 tablet; Refill: 1 - Lipid Panel With LDL/HDL Ratio  7. Cheilosis New onset.  Recurrent.  Patient currently only using Neosporin and Vaseline.  Patient has used a steroid in the past and though I generally use Chlortrimazole betamethasone combination because of the yeast infection.  Patient is also been encouraged to wash her face in the morning given that there may be some residual saliva that may be causing some irritation in the corners of mouth. - clotrimazole-betamethasone (LOTRISONE) cream; Apply 1 application topically 2 (two) times daily.  Dispense: 30 g; Refill: 0

## 2020-04-15 LAB — CBC WITH DIFFERENTIAL/PLATELET
Basophils Absolute: 0.1 10*3/uL (ref 0.0–0.2)
Basos: 1 %
EOS (ABSOLUTE): 0.2 10*3/uL (ref 0.0–0.4)
Eos: 3 %
Hematocrit: 35.3 % (ref 34.0–46.6)
Hemoglobin: 11.3 g/dL (ref 11.1–15.9)
Immature Grans (Abs): 0 10*3/uL (ref 0.0–0.1)
Immature Granulocytes: 0 %
Lymphocytes Absolute: 1.7 10*3/uL (ref 0.7–3.1)
Lymphs: 35 %
MCH: 27.4 pg (ref 26.6–33.0)
MCHC: 32 g/dL (ref 31.5–35.7)
MCV: 86 fL (ref 79–97)
Monocytes Absolute: 0.5 10*3/uL (ref 0.1–0.9)
Monocytes: 10 %
Neutrophils Absolute: 2.5 10*3/uL (ref 1.4–7.0)
Neutrophils: 51 %
Platelets: 296 10*3/uL (ref 150–450)
RBC: 4.12 x10E6/uL (ref 3.77–5.28)
RDW: 14.3 % (ref 11.7–15.4)
WBC: 5 10*3/uL (ref 3.4–10.8)

## 2020-04-15 LAB — THYROID PANEL WITH TSH
Free Thyroxine Index: 2.2 (ref 1.2–4.9)
T3 Uptake Ratio: 26 % (ref 24–39)
T4, Total: 8.3 ug/dL (ref 4.5–12.0)
TSH: 1.07 u[IU]/mL (ref 0.450–4.500)

## 2020-04-15 LAB — HEMOGLOBIN A1C
Est. average glucose Bld gHb Est-mCnc: 157 mg/dL
Hgb A1c MFr Bld: 7.1 % — ABNORMAL HIGH (ref 4.8–5.6)

## 2020-04-15 LAB — LIPID PANEL WITH LDL/HDL RATIO
Cholesterol, Total: 174 mg/dL (ref 100–199)
HDL: 83 mg/dL (ref >39)
LDL Chol Calc (NIH): 78 mg/dL (ref 0–99)
LDL/HDL Ratio: 0.9 ratio (ref 0.0–3.2)
Triglycerides: 71 mg/dL (ref 0–149)
VLDL Cholesterol Cal: 13 mg/dL (ref 5–40)

## 2020-04-15 LAB — COMPREHENSIVE METABOLIC PANEL
ALT: 14 IU/L (ref 0–32)
AST: 22 IU/L (ref 0–40)
Albumin/Globulin Ratio: 1.7 (ref 1.2–2.2)
Albumin: 4.5 g/dL (ref 3.8–4.8)
Alkaline Phosphatase: 71 IU/L (ref 44–121)
BUN/Creatinine Ratio: 13 (ref 12–28)
BUN: 8 mg/dL (ref 8–27)
Bilirubin Total: 0.2 mg/dL (ref 0.0–1.2)
CO2: 26 mmol/L (ref 20–29)
Calcium: 9.9 mg/dL (ref 8.7–10.3)
Chloride: 99 mmol/L (ref 96–106)
Creatinine, Ser: 0.64 mg/dL (ref 0.57–1.00)
GFR calc Af Amer: 111 mL/min/{1.73_m2} (ref >59)
GFR calc non Af Amer: 96 mL/min/{1.73_m2} (ref >59)
Globulin, Total: 2.7 g/dL (ref 1.5–4.5)
Glucose: 122 mg/dL — ABNORMAL HIGH (ref 65–99)
Potassium: 4.7 mmol/L (ref 3.5–5.2)
Sodium: 139 mmol/L (ref 134–144)
Total Protein: 7.2 g/dL (ref 6.0–8.5)

## 2020-04-15 LAB — MICROALBUMIN, URINE: Microalbumin, Urine: 11.7 ug/mL

## 2020-05-06 DIAGNOSIS — E669 Obesity, unspecified: Secondary | ICD-10-CM | POA: Diagnosis not present

## 2020-05-06 DIAGNOSIS — M67431 Ganglion, right wrist: Secondary | ICD-10-CM | POA: Diagnosis not present

## 2020-05-06 DIAGNOSIS — M654 Radial styloid tenosynovitis [de Quervain]: Secondary | ICD-10-CM | POA: Diagnosis not present

## 2020-06-17 ENCOUNTER — Other Ambulatory Visit: Payer: Self-pay | Admitting: Family Medicine

## 2020-06-17 DIAGNOSIS — J4520 Mild intermittent asthma, uncomplicated: Secondary | ICD-10-CM

## 2020-06-17 NOTE — Telephone Encounter (Signed)
Requested Prescriptions  Pending Prescriptions Disp Refills  . montelukast (SINGULAIR) 10 MG tablet [Pharmacy Med Name: MONTELUKAST SOD 10 MG TABLET] 90 tablet 1    Sig: TAKE 1 TABLET BY MOUTH EVERYDAY AT BEDTIME     Pulmonology:  Leukotriene Inhibitors Passed - 06/17/2020  1:24 AM      Passed - Valid encounter within last 12 months    Recent Outpatient Visits          2 months ago Essential hypertension   [facility] Duanne Limerick, MD   2 months ago Acute non-recurrent maxillary sinusitis   [facility] Duanne Limerick, MD   3 months ago Chest pain, unspecified type   [facility] Duanne Limerick, MD   8 months ago Essential hypertension   [facility] Duanne Limerick, MD   1 year ago Malaise and fatigue   [facility] Duanne Limerick, MD      Future Appointments            In 1 month Duanne Limerick, MD [facility], [facility]

## 2020-07-29 ENCOUNTER — Other Ambulatory Visit: Payer: Self-pay | Admitting: Family Medicine

## 2020-07-29 DIAGNOSIS — J4520 Mild intermittent asthma, uncomplicated: Secondary | ICD-10-CM

## 2020-07-29 NOTE — Telephone Encounter (Signed)
Requested Prescriptions  Pending Prescriptions Disp Refills  . albuterol (VENTOLIN HFA) 108 (90 Base) MCG/ACT inhaler [Pharmacy Med Name: ALBUTEROL HFA (PROVENTIL) INH] 6.7 each 2    Sig: TAKE 2 PUFFS BY MOUTH EVERY 6 HOURS AS NEEDED FOR WHEEZE OR SHORTNESS OF BREATH     Pulmonology:  Beta Agonists Failed - 07/29/2020  1:35 AM      Failed - One inhaler should last at least one month. If the patient is requesting refills earlier, contact the patient to check for uncontrolled symptoms.      Passed - Valid encounter within last 12 months    Recent Outpatient Visits          3 months ago Essential hypertension   [facility] Duanne Limerick, MD   4 months ago Acute non-recurrent maxillary sinusitis   [facility] Duanne Limerick, MD   4 months ago Chest pain, unspecified type   [facility] Duanne Limerick, MD   9 months ago Essential hypertension   [facility] Duanne Limerick, MD   1 year ago Malaise and fatigue   [facility] Duanne Limerick, MD      Future Appointments            In 2 weeks Duanne Limerick, MD [facility], [facility]

## 2020-08-15 ENCOUNTER — Ambulatory Visit: Payer: BC Managed Care – PPO | Admitting: Family Medicine

## 2020-10-18 ENCOUNTER — Other Ambulatory Visit: Payer: Self-pay | Admitting: Family Medicine

## 2020-10-18 DIAGNOSIS — R5381 Other malaise: Secondary | ICD-10-CM

## 2020-10-18 DIAGNOSIS — J4520 Mild intermittent asthma, uncomplicated: Secondary | ICD-10-CM

## 2020-10-18 DIAGNOSIS — R7303 Prediabetes: Secondary | ICD-10-CM

## 2020-10-18 DIAGNOSIS — F3341 Major depressive disorder, recurrent, in partial remission: Secondary | ICD-10-CM

## 2020-10-18 DIAGNOSIS — K130 Diseases of lips: Secondary | ICD-10-CM

## 2020-10-18 DIAGNOSIS — J301 Allergic rhinitis due to pollen: Secondary | ICD-10-CM

## 2020-10-18 DIAGNOSIS — E039 Hypothyroidism, unspecified: Secondary | ICD-10-CM

## 2020-10-18 NOTE — Telephone Encounter (Signed)
Requested medication (s) are due for refill today: yes  Requested medication (s) are on the active medication list: yes  Future visit scheduled:no  Notes to clinic: Patient had appointment on 08/15/2020 and was canceled  Failed protocol:TSH needs to be rechecked within 3 months after an abnormal result. Refill until TSH is due   Requested Prescriptions  Pending Prescriptions Disp Refills   clotrimazole-betamethasone (LOTRISONE) cream [Pharmacy Med Name: CLOTRIMAZOLE-BETAMETHASONE CRM] 30 g 0    Sig: APPLY TO AFFECTED AREA TWICE A DAY      Off-Protocol Failed - 10/18/2020  8:55 AM      Failed - Medication not assigned to a protocol, review manually.      Passed - Valid encounter within last 12 months    Recent Outpatient Visits           6 months ago Essential hypertension   [facility] Juline Patch, MD   6 months ago Acute non-recurrent maxillary sinusitis   [facility] Juline Patch, MD   7 months ago Chest pain, unspecified type   [facility] Juline Patch, MD   1 year ago Essential hypertension   [facility] Juline Patch, MD   1 year ago Malaise and fatigue   [facility] Juline Patch, MD                  citalopram (CELEXA) 20 MG tablet [Pharmacy Med Name: CITALOPRAM HBR 20 MG TABLET] 90 tablet 1    Sig: TAKE 1 [place]      Psychiatry:  Antidepressants - SSRI Failed - 10/18/2020  8:55 AM      Failed - Valid encounter within last 6 months    Recent Outpatient Visits           6 months ago Essential hypertension   [facility] Juline Patch, MD   6 months ago Acute non-recurrent maxillary sinusitis   [facility] Juline Patch, MD   7 months ago Chest pain, unspecified type   [facility] Juline Patch, MD   1 year ago Essential hypertension   [facility] Juline Patch, MD   1 year ago Malaise and fatigue   [facility] Juline Patch, MD                Passed - Completed PHQ-2 or PHQ-9 in the last 360 days        albuterol (VENTOLIN HFA) 108 (90 Base) MCG/ACT inhaler [Pharmacy Med Name: ALBUTEROL HFA (PROVENTIL) INH] 6.7 each 2    Sig: TAKE 2 PUFFS BY MOUTH EVERY 6 HOURS AS NEEDED FOR WHEEZE OR SHORTNESS OF BREATH      Pulmonology:  Beta Agonists Failed - 10/18/2020  8:55 AM      Failed - One inhaler should last at least one month. If the patient is requesting refills earlier, contact the patient to check for uncontrolled symptoms.      Passed - Valid encounter within last 12 months    Recent Outpatient Visits           6 months ago Essential hypertension   [facility] Juline Patch, MD   6 months ago Acute non-recurrent maxillary sinusitis   [facility] Juline Patch, MD   7 months ago Chest pain, unspecified type   [facility] Juline Patch, MD   1 year ago  Essential hypertension   [facility] Juline Patch, MD   1 year ago Malaise and fatigue   [facility] Juline Patch, MD                  metFORMIN (GLUCOPHAGE-XR) 500 MG 24 hr tablet [Pharmacy Med Name: METFORMIN HCL ER 500 MG TABLET] 90 tablet 1    Sig: TAKE 1 TABLET BY [place]      Endocrinology:  Diabetes - Biguanides Failed - 10/18/2020  8:55 AM      Failed - HBA1C is between 0 and 7.9 and within 180 days    Hgb A1c MFr Bld  Date Value Ref Range Status  04/14/2020 7.1 (H) 4.8 - 5.6 % Final    Comment:             Prediabetes: 5.7 - 6.4          Diabetes: >6.4          Glycemic control for adults with diabetes: <7.0           Failed - Valid encounter within last 6 months    Recent Outpatient Visits           6 months ago Essential hypertension   [facility] Juline Patch, MD   6 months ago Acute non-recurrent maxillary sinusitis   [facility] Juline Patch, MD   7 months ago Chest pain, unspecified type    [facility] Juline Patch, MD   1 year ago Essential hypertension   [facility] Juline Patch, MD   1 year ago [facility] and fatigue   [facility] Juline Patch, MD                Passed - Cr in normal range and within 360 days    Creatinine, Ser  Date Value Ref Range Status  04/14/2020 0.64 0.57 - 1.00 mg/dL Final          Passed - eGFR in normal range and within 360 days    GFR calc Af Amer  Date Value Ref Range Status  04/14/2020 111 >59 mL/min/1.73 Final    Comment:    **In accordance with recommendations from the NKF-ASN Task force,**   [facility] is in the process of updating its eGFR calculation to the   2021 CKD-EPI creatinine equation that estimates kidney function   without a race variable.    GFR calc non Af Amer  Date Value Ref Range Status  04/14/2020 96 >59 mL/min/1.73 Final            levothyroxine (SYNTHROID) 75 MCG tablet [Pharmacy Med Name: LEVOTHYROXINE 75 MCG TABLET] 90 tablet 1    Sig: TAKE 1 TABLET BY MOUTH EVERY DAY      Endocrinology:  Hypothyroid Agents Failed - 10/18/2020  8:55 AM      Failed - TSH needs to be rechecked within 3 months after an abnormal result. Refill until TSH is due.      Passed - TSH in normal range and within 360 days    TSH  Date Value Ref Range Status  04/14/2020 1.070 0.450 - 4.500 uIU/mL Final          Passed - Valid encounter within last 12 months    Recent Outpatient Visits           6 months ago Essential hypertension   [facility] Juline Patch,  MD   6 months ago Acute non-recurrent maxillary sinusitis   [facility] Juline Patch, MD   7 months ago Chest pain, unspecified type   [facility] Juline Patch, MD   1 year ago Essential hypertension   [facility] Juline Patch, MD   1 year ago Malaise and fatigue   [facility] Juline Patch, MD                  fluticasone (FLONASE) 50 MCG/ACT nasal spray  [Pharmacy Med Name: FLUTICASONE PROP 50 MCG SPRAY] 48 mL 3    Sig: SPRAY 2 SPRAYS INTO EACH NOSTRIL EVERY DAY      Ear, Nose, and Throat: Nasal Preparations - Corticosteroids Passed - 10/18/2020  8:55 AM      Passed - Valid encounter within last 12 months    Recent Outpatient Visits           6 months ago Essential hypertension   [place], Deanna C, MD   6 months ago Acute non-recurrent maxillary sinusitis   [facility] Juline Patch, MD   7 months ago Chest pain, unspecified type   [facility] Juline Patch, MD   1 year ago Essential hypertension   [facility] Juline Patch, MD   1 year ago [facility] and fatigue   [facility] Juline Patch, MD

## 2020-10-19 ENCOUNTER — Telehealth: Payer: Self-pay

## 2020-10-19 NOTE — Telephone Encounter (Signed)
Copied from CRM [id number]. Topic: General - Other >> Oct 19, 2020 11:40 AM Marylen Ponto wrote: Reason for CRM: Pt very upset that she received a letter from a specialist office regarding an appt. Pt stated she was not aware of the appt and needs to speak with Dr. Yetta Barre or her nurse because none of this was communicated to her. Cb# [id number]

## 2020-10-20 DIAGNOSIS — E119 Type 2 diabetes mellitus without complications: Secondary | ICD-10-CM | POA: Diagnosis not present

## 2020-10-20 DIAGNOSIS — H26493 Other secondary cataract, bilateral: Secondary | ICD-10-CM | POA: Diagnosis not present

## 2020-10-24 LAB — HM DIABETES EYE EXAM

## 2020-11-03 ENCOUNTER — Other Ambulatory Visit: Payer: Self-pay

## 2020-11-03 ENCOUNTER — Ambulatory Visit (INDEPENDENT_AMBULATORY_CARE_PROVIDER_SITE_OTHER): Payer: BC Managed Care – PPO | Admitting: Family Medicine

## 2020-11-03 ENCOUNTER — Encounter: Payer: Self-pay | Admitting: Family Medicine

## 2020-11-03 VITALS — BP 120/80 | HR 64 | Ht 63.0 in | Wt 171.0 lb

## 2020-11-03 DIAGNOSIS — J4520 Mild intermittent asthma, uncomplicated: Secondary | ICD-10-CM

## 2020-11-03 DIAGNOSIS — J301 Allergic rhinitis due to pollen: Secondary | ICD-10-CM

## 2020-11-03 DIAGNOSIS — E782 Mixed hyperlipidemia: Secondary | ICD-10-CM | POA: Diagnosis not present

## 2020-11-03 DIAGNOSIS — E039 Hypothyroidism, unspecified: Secondary | ICD-10-CM | POA: Diagnosis not present

## 2020-11-03 DIAGNOSIS — R7303 Prediabetes: Secondary | ICD-10-CM

## 2020-11-03 DIAGNOSIS — F3341 Major depressive disorder, recurrent, in partial remission: Secondary | ICD-10-CM | POA: Diagnosis not present

## 2020-11-03 DIAGNOSIS — I10 Essential (primary) hypertension: Secondary | ICD-10-CM

## 2020-11-03 LAB — POCT URINALYSIS DIPSTICK
Bilirubin, UA: NEGATIVE
Blood, UA: NEGATIVE
Glucose, UA: NEGATIVE
Ketones, UA: NEGATIVE
Leukocytes, UA: NEGATIVE
Nitrite, UA: NEGATIVE
Protein, UA: NEGATIVE
Spec Grav, UA: 1.02 (ref 1.010–1.025)
Urobilinogen, UA: 0.2 E.U./dL
pH, UA: 5 (ref 5.0–8.0)

## 2020-11-03 MED ORDER — MONTELUKAST SODIUM 10 MG PO TABS: ORAL_TABLET | ORAL | 1 refills | Status: DC

## 2020-11-03 MED ORDER — HYDROCHLOROTHIAZIDE 25 MG PO TABS: 25.0000 mg | ORAL_TABLET | Freq: Every day | ORAL | 1 refills | Status: DC

## 2020-11-03 MED ORDER — METFORMIN HCL ER 500 MG PO TB24: ORAL_TABLET | ORAL | 1 refills | Status: DC

## 2020-11-03 MED ORDER — SIMVASTATIN 20 MG PO TABS: 20.0000 mg | ORAL_TABLET | Freq: Every day | ORAL | 1 refills | Status: DC

## 2020-11-03 MED ORDER — CITALOPRAM HYDROBROMIDE 20 MG PO TABS: 20.0000 mg | ORAL_TABLET | Freq: Every day | ORAL | 1 refills | Status: DC

## 2020-11-03 MED ORDER — LEVOTHYROXINE SODIUM 75 MCG PO TABS: ORAL_TABLET | ORAL | 1 refills | Status: DC

## 2020-11-03 NOTE — Progress Notes (Signed)
Date:  11/03/2020   Name:  Phyllis Wagner   DOB:  1955-03-22   MRN:  [id number]   Chief Complaint: Hyperlipidemia, Asthma, Diabetes, Hypothyroidism, Hypertension, Allergic Rhinitis , Depression, and urinary pressure  Hyperlipidemia This is a chronic problem. The current episode started more than 1 year ago. The problem is controlled. Recent lipid tests were reviewed and are normal. Exacerbating diseases include diabetes and hypothyroidism. She has no history of chronic renal disease or liver disease. Factors aggravating her hyperlipidemia include fatty foods. Pertinent negatives include no chest pain, leg pain, myalgias or shortness of breath. Current antihyperlipidemic treatment includes statins. The current treatment provides moderate improvement of lipids. There are no compliance problems.   Asthma There is no chest tightness, cough, difficulty breathing, frequent throat clearing, hemoptysis, hoarse voice, shortness of breath or wheezing. This is a chronic problem. The current episode started more than 1 year ago. The problem occurs rarely. Pertinent negatives include no appetite change, chest pain, dyspnea on exertion, ear congestion, ear pain, fever, headaches, heartburn, malaise/fatigue, myalgias, nasal congestion, sore throat, sweats, trouble swallowing or weight loss. Her symptoms are alleviated by beta-agonist. Her past medical history is significant for asthma.  Diabetes She has type 2 diabetes mellitus. No MedicAlert identification noted. Pertinent negatives for hypoglycemia include no confusion, dizziness, headaches, hunger, mood changes, nervousness/anxiousness, seizures, sleepiness, speech difficulty, sweats or tremors. Pertinent negatives for diabetes include no blurred vision, no chest pain, no fatigue, no foot paresthesias, no foot ulcerations, no polydipsia, no polyphagia, no polyuria, no visual change, no weakness and no weight loss. Pertinent negatives for diabetic  complications include no CVA, PVD or retinopathy. She is following a diabetic and generally healthy diet. Meal planning includes avoidance of concentrated sweets and carbohydrate counting. She has not had a previous visit with a dietitian. Her breakfast blood glucose is taken between 7-8 am. Her breakfast blood glucose range is generally 130-140 mg/dl.  Hypertension This is a chronic problem. The current episode started more than 1 year ago. Pertinent negatives include no anxiety, blurred vision, chest pain, headaches, malaise/fatigue, neck pain, orthopnea, palpitations, peripheral edema, shortness of breath or sweats. Past treatments include diuretics. The current treatment provides moderate improvement. There are no compliance problems.  There is no history of angina, kidney disease, CAD/MI, CVA, heart failure, left ventricular hypertrophy, PVD or retinopathy. Identifiable causes of hypertension include a thyroid problem. There is no history of chronic renal disease, a hypertension causing med or renovascular disease.  Depression        This is a chronic problem.  The problem occurs intermittently.  The problem has been waxing and waning since onset.  Associated symptoms include insomnia.  Associated symptoms include no decreased concentration, no fatigue, no helplessness, no hopelessness, not irritable, no restlessness, no decreased interest, no appetite change, no body aches, no myalgias, no headaches, no indigestion, not sad and no suicidal ideas.  Past medical history includes hypothyroidism and thyroid problem.     Pertinent negatives include no anxiety. Thyroid Problem Presents for follow-up visit. Patient reports no anxiety, constipation, depressed mood, diarrhea, fatigue, hoarse voice, palpitations, tremors, visual change or weight loss. Her past medical history is significant for diabetes and hyperlipidemia. There is no history of heart failure.   Lab Results  Component Value Date   CREATININE  0.64 04/14/2020   BUN 8 04/14/2020   NA 139 04/14/2020   K 4.7 04/14/2020   CL 99 04/14/2020   CO2 26 04/14/2020   Lab Results  Component Value Date   CHOL 174 04/14/2020   HDL 83 04/14/2020   LDLCALC 78 04/14/2020   TRIG 71 04/14/2020   CHOLHDL 2.5 02/04/2018   Lab Results  Component Value Date   TSH 1.070 04/14/2020   Lab Results  Component Value Date   HGBA1C 7.1 (H) 04/14/2020   Lab Results  Component Value Date   WBC 5.0 04/14/2020   HGB 11.3 04/14/2020   HCT 35.3 04/14/2020   MCV 86 04/14/2020   PLT 296 04/14/2020   Lab Results  Component Value Date   ALT 14 04/14/2020   AST 22 04/14/2020   ALKPHOS 71 04/14/2020   BILITOT 0.2 04/14/2020     Review of Systems  Constitutional:  Negative for appetite change, chills, fatigue, fever, malaise/fatigue and weight loss.  HENT:  Negative for drooling, ear discharge, ear pain, hoarse voice, sore throat and trouble swallowing.   Eyes:  Negative for blurred vision.  Respiratory:  Negative for cough, hemoptysis, shortness of breath and wheezing.   Cardiovascular:  Negative for chest pain, dyspnea on exertion, palpitations, orthopnea and leg swelling.  Gastrointestinal:  Negative for abdominal pain, blood in stool, constipation, diarrhea, heartburn and nausea.  Endocrine: Negative for polydipsia, polyphagia and polyuria.  Genitourinary:  Negative for dysuria, frequency, hematuria and urgency.  Musculoskeletal:  Negative for back pain, myalgias and neck pain.  Skin:  Negative for rash.  Allergic/Immunologic: Negative for environmental allergies.  Neurological:  Negative for dizziness, tremors, seizures, speech difficulty, weakness and headaches.  Hematological:  Does not bruise/bleed easily.  Psychiatric/Behavioral:  Positive for depression. Negative for confusion, decreased concentration and suicidal ideas. The patient has insomnia. The patient is not nervous/anxious.    Patient Active Problem List   Diagnosis Date  Noted   Calculus of gallbladder without cholecystitis without obstruction 06/12/2017   Chronic maxillary sinusitis 03/15/2015   Chronic rhinitis 03/15/2015   Mild intermittent asthma without complication 03/15/2015   Oroantral fistula 03/15/2015   Essential hypertension 01/05/2015   Thyroid activity decreased 01/05/2015   Hyperlipidemia 01/05/2015   Recurrent major depressive disorder, in partial remission (HCC) 01/05/2015    Allergies  Allergen Reactions   Aspirin Anaphylaxis, Hives and Shortness Of Breath   Biaxin [Clarithromycin] Anaphylaxis, Hives and Shortness Of Breath   Nsaids Hives and Swelling   Penicillins Anaphylaxis, Hives and Swelling    Has patient had a PCN reaction causing immediate rash, facial/tongue/throat swelling, SOB or lightheadedness with hypotension:Yes Has patient had a PCN reaction causing severe rash involving mucus membranes or skin necrosis: Unknown Has patient had a PCN reaction that required hospitalization: Unknown Has patient had a PCN reaction occurring within the last 10 years: No Childhood reaction. If all of the above answers are "NO", then may proceed with Cephalosporin use.    Sulfa Antibiotics Hives   Ancef [Cefazolin] Rash    Past Surgical History:  Procedure Laterality Date   CHOLECYSTECTOMY N/A 06/21/2017   Procedure: LAPAROSCOPIC CHOLECYSTECTOMY WITH INTRAOPERATIVE CHOLANGIOGRAM;  Surgeon: Earline Mayotte, MD;  Location: [facility] ORS;  Service: General;  Laterality: N/A;   COLONOSCOPY  2012   cleared for 10 yrs- [place] [facility]   EYE SURGERY Bilateral 2018   GASTRIC BYPASS  2013   Dr Ethelene Hal at [facility]   KNEE SURGERY Left    LYMPH NODE DISSECTION     TONSILLECTOMY     UMBILICAL HERNIA REPAIR  06/21/2017   Procedure: HERNIA REPAIR UMBILICAL ADULT;  Surgeon: Earline Mayotte, MD;  Location: [facility] ORS;  Service: General;;   VAGINAL HYSTERECTOMY     ovaries remain    Social History   Tobacco Use   Smoking status: Former    Packs/day:  1.00    Years: 20.00    Pack years: 20.00    Types: Cigarettes    Quit date: 06/18/2010    Years since quitting: 10.3   Smokeless tobacco: Never  Vaping Use   Vaping Use: Never used  Substance Use Topics   Alcohol use: Not Currently    Alcohol/week: 0.0 standard drinks   Drug use: No     Medication list has been reviewed and updated.  Current Meds  Medication Sig   albuterol (VENTOLIN HFA) 108 (90 Base) MCG/ACT inhaler TAKE 2 PUFFS BY MOUTH EVERY 6 HOURS AS NEEDED FOR WHEEZE OR SHORTNESS OF BREATH   APPLE CIDER VINEGAR PO Take 2 tablets by mouth daily.   ascorbic acid (VITAMIN C) 250 MG CHEW Chew 250 mg by mouth daily.   cholecalciferol (VITAMIN D) 1000 UNITS tablet Take 1,000 Units by mouth daily.   CINNAMON PO Take 8,000 mg by mouth daily.   citalopram (CELEXA) 20 MG tablet TAKE 1 TABLET BY MOUTH EVERY DAY   clotrimazole-betamethasone (LOTRISONE) cream APPLY TO AFFECTED AREA TWICE A DAY   fluticasone (FLONASE) 50 MCG/ACT nasal spray SPRAY 2 SPRAYS INTO EACH NOSTRIL EVERY DAY   Glucosamine-Chondroitin (COSAMIN DS PO) Take 2 tablets by mouth daily.   hydrochlorothiazide (HYDRODIURIL) 25 MG tablet Take 1 tablet (25 mg total) by mouth daily.   levothyroxine (SYNTHROID) 75 MCG tablet TAKE 1 TABLET BY MOUTH EVERY DAY   metFORMIN (GLUCOPHAGE-XR) 500 MG 24 hr tablet TAKE 1 TABLET BY MOUTH EVERY DAY WITH BREAKFAST   montelukast (SINGULAIR) 10 MG tablet TAKE 1 TABLET BY MOUTH EVERYDAY AT BEDTIME   Omega-3 Fatty Acids (FISH OIL) 1000 MG CAPS Take 1 capsule (1,000 mg total) by mouth daily.   Polyethyl Glycol-Propyl Glycol 0.4-0.3 % SOLN Place 1 drop into both eyes 3 (three) times daily as needed (for dry eyes.).   simvastatin (ZOCOR) 20 MG tablet Take 1 tablet (20 mg total) by mouth daily.   vitamin B-12 (CYANOCOBALAMIN) 500 MCG tablet Take 500 mcg by mouth daily.    PHQ 2/9 Scores 11/03/2020 04/14/2020 03/07/2020 10/13/2019  PHQ - 2 Score 0 0 0 0  PHQ- 9 Score 5 0 0 0    GAD 7 :  Generalized Anxiety Score 11/03/2020 04/14/2020 03/07/2020 10/13/2019  Nervous, Anxious, on Edge 0 0 0 0  Control/stop worrying 1 0 0 0  Worry too much - different things 0 0 0 0  Trouble relaxing 0 0 0 0  Restless 0 0 0 0  Easily annoyed or irritable 0 0 0 0  Afraid - awful might happen 0 0 0 0  Total GAD 7 Score 1 0 0 0  Anxiety Difficulty Not difficult at all - Not difficult at all Not difficult at all    BP Readings from Last 3 Encounters:  04/14/20 104/62  03/25/20 130/70  03/07/20 124/78    Physical Exam Vitals and nursing note reviewed.  Constitutional:      General: She is not irritable.    Appearance: She is well-developed.  HENT:     Head: Normocephalic.     Right Ear: Tympanic membrane, ear canal and external ear normal. There is no impacted cerumen.     Left Ear: Tympanic membrane, ear canal and external ear normal. There is no impacted cerumen.  Nose: Nose normal. No congestion or rhinorrhea.  Eyes:     General: Lids are everted, no foreign bodies appreciated. No scleral icterus.       Left eye: No foreign body or hordeolum.     Conjunctiva/sclera: Conjunctivae normal.     Right eye: Right conjunctiva is not injected.     Left eye: Left conjunctiva is not injected.     Pupils: Pupils are equal, round, and reactive to light.  Neck:     Thyroid: No thyromegaly.     Vascular: No JVD.     Trachea: No tracheal deviation.  Cardiovascular:     Rate and Rhythm: Normal rate and regular rhythm.     Heart sounds: Normal heart sounds. No murmur heard.   No friction rub. No gallop.  Pulmonary:     Effort: Pulmonary effort is normal. No respiratory distress.     Breath sounds: Normal breath sounds. No wheezing, rhonchi or rales.  Abdominal:     General: Bowel sounds are normal.     Palpations: Abdomen is soft. There is no mass.     Tenderness: There is no abdominal tenderness. There is no guarding or rebound.  Musculoskeletal:        General: No tenderness. Normal  range of motion.     Cervical back: Normal range of motion and neck supple.  Lymphadenopathy:     Cervical: No cervical adenopathy.  Skin:    General: Skin is warm.     Findings: No rash.  Neurological:     Mental Status: She is alert and oriented to person, place, and time.     Cranial Nerves: No cranial nerve deficit.     Deep Tendon Reflexes: Reflexes normal.  Psychiatric:        Mood and Affect: Mood is not anxious or depressed.    Wt Readings from Last 3 Encounters:  11/03/20 171 lb (77.6 kg)  04/14/20 172 lb (78 kg)  03/25/20 174 lb (78.9 kg)    Ht  (1.6 m)   Wt 171 lb (77.6 kg)   BMI 30.29 kg/m   Assessment and Plan: 1. Essential hypertension Chronic.  Controlled.  Stable.  Continue hydrochlorothiazide 25 mg once a day.  Will check renal function panel and urinalysis. - hydrochlorothiazide (HYDRODIURIL) 25 MG tablet; Take 1 tablet (25 mg total) by mouth daily.  Dispense: 90 tablet; Refill: 1 - Renal Function Panel - POCT urinalysis dipstick  2. Hypothyroidism, unspecified type Chronic.  Controlled.  Stable.  Likely will continue Synthroid 75 mcg daily but we will check current TSH level for control. - levothyroxine (SYNTHROID) 75 MCG tablet; TAKE 1 TABLET BY MOUTH EVERY DAY  Dispense: 90 tablet; Refill: 1 - Thyroid Panel With TSH  3. Prediabetes Chronic.  Controlled.  Stable.  Currently on metformin XR 500 mg daily patient used to be on glipizide.  We will check A1c to see if this may need to be resumed. - metFORMIN (GLUCOPHAGE-XR) 500 MG 24 hr tablet; Take 1 tablet by mouth daily  Dispense: 90 tablet; Refill: 1 - HgB A1c  4. Recurrent major depressive disorder, in partial remission (HCC) Chronic.  Controlled.  Stable.  Continues Celexa 20 mg once a day. - citalopram (CELEXA) 20 MG tablet; Take 1 tablet (20 mg total) by mouth daily.  Dispense: 90 tablet; Refill: 1  5. Seasonal allergic rhinitis due to pollen Currently on antihistamines on a as needed  basis.  6. Mild intermittent reactive airway disease without complication Episodic.  Partially controlled.  Stable.  Continue Singulair but have discussed the possibility of continuance of albuterol per tickly when she is going to be around hay since that seems to be bothering her at this time. - montelukast (SINGULAIR) 10 MG tablet; TAKE 1 TABLET BY MOUTH EVERYDAY AT BEDTIME  Dispense: 90 tablet; Refill: 1  7. Mixed hyperlipidemia .  Controlled.  Stable.  Continue simvastatin 20 mg once a day. - simvastatin (ZOCOR) 20 MG tablet; Take 1 tablet (20 mg total) by mouth daily.  Dispense: 90 tablet; Refill: 1 - Lipid Panel With LDL/HDL Ratio

## 2020-11-04 ENCOUNTER — Other Ambulatory Visit: Payer: Self-pay

## 2020-11-04 DIAGNOSIS — E1165 Type 2 diabetes mellitus with hyperglycemia: Secondary | ICD-10-CM

## 2020-11-04 LAB — RENAL FUNCTION PANEL
Albumin: 4.3 g/dL (ref 3.8–4.8)
BUN/Creatinine Ratio: 19 (ref 12–28)
BUN: 12 mg/dL (ref 8–27)
CO2: 25 mmol/L (ref 20–29)
Calcium: 9.8 mg/dL (ref 8.7–10.3)
Chloride: 100 mmol/L (ref 96–106)
Creatinine, Ser: 0.64 mg/dL (ref 0.57–1.00)
Glucose: 124 mg/dL — ABNORMAL HIGH (ref 65–99)
Phosphorus: 4 mg/dL (ref 3.0–4.3)
Potassium: 4.8 mmol/L (ref 3.5–5.2)
Sodium: 138 mmol/L (ref 134–144)
eGFR: 100 mL/min/{1.73_m2} (ref >59)

## 2020-11-04 LAB — HEMOGLOBIN A1C
Est. average glucose Bld gHb Est-mCnc: 163 mg/dL
Hgb A1c MFr Bld: 7.3 % — ABNORMAL HIGH (ref 4.8–5.6)

## 2020-11-04 LAB — THYROID PANEL WITH TSH
Free Thyroxine Index: 2.1 (ref 1.2–4.9)
T3 Uptake Ratio: 26 % (ref 24–39)
T4, Total: 8.2 ug/dL (ref 4.5–12.0)
TSH: 0.665 u[IU]/mL (ref 0.450–4.500)

## 2020-11-04 LAB — LIPID PANEL WITH LDL/HDL RATIO
Cholesterol, Total: 176 mg/dL (ref 100–199)
HDL: 75 mg/dL (ref >39)
LDL Chol Calc (NIH): 89 mg/dL (ref 0–99)
LDL/HDL Ratio: 1.2 ratio (ref 0.0–3.2)
Triglycerides: 60 mg/dL (ref 0–149)
VLDL Cholesterol Cal: 12 mg/dL (ref 5–40)

## 2020-11-04 MED ORDER — GLIPIZIDE ER 2.5 MG PO TB24: 2.5000 mg | ORAL_TABLET | Freq: Every day | ORAL | 1 refills | Status: DC

## 2020-11-10 DIAGNOSIS — H26491 Other secondary cataract, right eye: Secondary | ICD-10-CM | POA: Diagnosis not present

## 2020-11-27 ENCOUNTER — Other Ambulatory Visit: Payer: Self-pay | Admitting: Family Medicine

## 2020-11-27 DIAGNOSIS — E1165 Type 2 diabetes mellitus with hyperglycemia: Secondary | ICD-10-CM

## 2020-11-28 NOTE — Telephone Encounter (Signed)
Requested medication (s) are due for refill today:   No  Requested medication (s) are on the active medication list:   Yes  Future visit scheduled:   Yes 12/27 2022 with Dr. Yetta Barre   Last ordered: 11/04/2020 #30, 1 refill  Returned because there is a note not to give 90 day refills until she has had her 8 week f/u.   Her appt isn't until 12/27.   What she was prescribed isn't enough to last until 12/27.     Requested Prescriptions  Pending Prescriptions Disp Refills   glipiZIDE (GLUCOTROL XL) 2.5 MG 24 hr tablet [Pharmacy Med Name: GLIPIZIDE ER 2.5 MG TABLET] 30 tablet 1    Sig: TAKE 1 TABLET BY MOUTH DAILY WITH BREAKFAST. ADD TO METFORMIN. WILL RECHECK IN 8 WEEKS     Endocrinology:  Diabetes - Sulfonylureas Passed - 11/27/2020 12:31 PM      Passed - HBA1C is between 0 and 7.9 and within 180 days    Hgb A1c MFr Bld  Date Value Ref Range Status  11/03/2020 7.3 (H) 4.8 - 5.6 % Final    Comment:             Prediabetes: 5.7 - 6.4          Diabetes: >6.4          Glycemic control for adults with diabetes: <7.0           Passed - Valid encounter within last 6 months    Recent Outpatient Visits           3 weeks ago Essential hypertension   [facility] Duanne Limerick, MD   7 months ago Essential hypertension   [facility] Duanne Limerick, MD   8 months ago Acute non-recurrent maxillary sinusitis   [facility] Duanne Limerick, MD   8 months ago Chest pain, unspecified type   [facility] Duanne Limerick, MD   1 year ago Essential hypertension   [facility] Duanne Limerick, MD       Future Appointments             In 3 months Duanne Limerick, MD [facility], [facility]

## 2020-11-29 DIAGNOSIS — Z1211 Encounter for screening for malignant neoplasm of colon: Secondary | ICD-10-CM | POA: Diagnosis not present

## 2020-12-20 ENCOUNTER — Other Ambulatory Visit: Payer: Self-pay | Admitting: Family Medicine

## 2020-12-20 DIAGNOSIS — E1165 Type 2 diabetes mellitus with hyperglycemia: Secondary | ICD-10-CM

## 2020-12-20 NOTE — Telephone Encounter (Signed)
Requested Prescriptions  Pending Prescriptions Disp Refills  . glipiZIDE (GLUCOTROL XL) 2.5 MG 24 hr tablet [Pharmacy Med Name: GLIPIZIDE ER 2.5 MG TABLET] 30 tablet 1    Sig: TAKE 1 TABLET BY MOUTH DAILY WITH BREAKFAST. ADD TO METFORMIN. WILL RECHECK IN 8 WEEKS     Endocrinology:  Diabetes - Sulfonylureas Passed - 12/20/2020 12:31 PM      Passed - HBA1C is between 0 and 7.9 and within 180 days    Hgb A1c MFr Bld  Date Value Ref Range Status  11/03/2020 7.3 (H) 4.8 - 5.6 % Final    Comment:             Prediabetes: 5.7 - 6.4          Diabetes: >6.4          Glycemic control for adults with diabetes: <7.0          Passed - Valid encounter within last 6 months    Recent Outpatient Visits          1 month ago Essential hypertension   [facility] Duanne Limerick, MD   8 months ago Essential hypertension   [facility] Duanne Limerick, MD   9 months ago Acute non-recurrent maxillary sinusitis   [facility] Duanne Limerick, MD   9 months ago Chest pain, unspecified type   [facility] Duanne Limerick, MD   1 year ago Essential hypertension   [facility] Duanne Limerick, MD      Future Appointments            In 2 months Duanne Limerick, MD [facility], [facility]

## 2021-01-20 ENCOUNTER — Other Ambulatory Visit: Payer: Self-pay | Admitting: Family Medicine

## 2021-01-20 DIAGNOSIS — E1165 Type 2 diabetes mellitus with hyperglycemia: Secondary | ICD-10-CM

## 2021-01-23 LAB — HM COLONOSCOPY

## 2021-02-01 DIAGNOSIS — Z1211 Encounter for screening for malignant neoplasm of colon: Secondary | ICD-10-CM | POA: Diagnosis not present

## 2021-02-19 ENCOUNTER — Other Ambulatory Visit: Payer: Self-pay | Admitting: Family Medicine

## 2021-02-19 DIAGNOSIS — E1165 Type 2 diabetes mellitus with hyperglycemia: Secondary | ICD-10-CM

## 2021-03-07 ENCOUNTER — Ambulatory Visit: Payer: BC Managed Care – PPO | Admitting: Family Medicine

## 2021-03-10 DIAGNOSIS — Z1231 Encounter for screening mammogram for malignant neoplasm of breast: Secondary | ICD-10-CM | POA: Diagnosis not present

## 2021-03-10 LAB — HM MAMMOGRAPHY

## 2021-03-21 ENCOUNTER — Other Ambulatory Visit: Payer: Self-pay | Admitting: Family Medicine

## 2021-03-21 DIAGNOSIS — E1165 Type 2 diabetes mellitus with hyperglycemia: Secondary | ICD-10-CM

## 2021-04-05 DIAGNOSIS — H0014 Chalazion left upper eyelid: Secondary | ICD-10-CM | POA: Diagnosis not present

## 2021-04-06 ENCOUNTER — Other Ambulatory Visit: Payer: Self-pay

## 2021-04-06 ENCOUNTER — Ambulatory Visit (INDEPENDENT_AMBULATORY_CARE_PROVIDER_SITE_OTHER): Payer: BC Managed Care – PPO | Admitting: Family Medicine

## 2021-04-06 ENCOUNTER — Encounter: Payer: Self-pay | Admitting: Family Medicine

## 2021-04-06 VITALS — BP 130/62 | HR 72 | Ht 63.0 in | Wt 171.0 lb

## 2021-04-06 DIAGNOSIS — E039 Hypothyroidism, unspecified: Secondary | ICD-10-CM | POA: Diagnosis not present

## 2021-04-06 DIAGNOSIS — R7303 Prediabetes: Secondary | ICD-10-CM | POA: Diagnosis not present

## 2021-04-06 DIAGNOSIS — J301 Allergic rhinitis due to pollen: Secondary | ICD-10-CM

## 2021-04-06 DIAGNOSIS — J4520 Mild intermittent asthma, uncomplicated: Secondary | ICD-10-CM

## 2021-04-06 DIAGNOSIS — F3341 Major depressive disorder, recurrent, in partial remission: Secondary | ICD-10-CM

## 2021-04-06 DIAGNOSIS — E782 Mixed hyperlipidemia: Secondary | ICD-10-CM | POA: Diagnosis not present

## 2021-04-06 DIAGNOSIS — E1165 Type 2 diabetes mellitus with hyperglycemia: Secondary | ICD-10-CM

## 2021-04-06 DIAGNOSIS — I10 Essential (primary) hypertension: Secondary | ICD-10-CM

## 2021-04-06 MED ORDER — HYDROCHLOROTHIAZIDE 25 MG PO TABS: 25.0000 mg | ORAL_TABLET | Freq: Every day | ORAL | 1 refills | Status: DC

## 2021-04-06 MED ORDER — FLUTICASONE PROPIONATE 50 MCG/ACT NA SUSP: NASAL | 0 refills | Status: DC

## 2021-04-06 MED ORDER — CITALOPRAM HYDROBROMIDE 20 MG PO TABS: 20.0000 mg | ORAL_TABLET | Freq: Every day | ORAL | 1 refills | Status: DC

## 2021-04-06 MED ORDER — SIMVASTATIN 20 MG PO TABS: 20.0000 mg | ORAL_TABLET | Freq: Every day | ORAL | 1 refills | Status: DC

## 2021-04-06 MED ORDER — LEVOTHYROXINE SODIUM 75 MCG PO TABS: ORAL_TABLET | ORAL | 1 refills | Status: DC

## 2021-04-06 MED ORDER — MONTELUKAST SODIUM 10 MG PO TABS: ORAL_TABLET | ORAL | 1 refills | Status: DC

## 2021-04-06 MED ORDER — METFORMIN HCL ER 500 MG PO TB24: ORAL_TABLET | ORAL | 1 refills | Status: DC

## 2021-04-06 MED ORDER — GLIPIZIDE ER 2.5 MG PO TB24: ORAL_TABLET | ORAL | 1 refills | Status: DC

## 2021-04-06 NOTE — Progress Notes (Signed)
Date:  04/06/2021   Name:  Phyllis Wagner   DOB:  [birth year]   MRN:  [id number]   Chief Complaint: Hypertension, Hyperlipidemia, Allergic Rhinitis , Diabetes, Hypothyroidism, and Depression  Hypertension This is a chronic problem. The current episode started more than 1 year ago. The problem has been waxing and waning since onset. The problem is controlled. Pertinent negatives include no anxiety, blurred vision, chest pain, headaches, malaise/fatigue, neck pain, orthopnea, palpitations, peripheral edema, PND, shortness of breath or sweats. There are no known risk factors for coronary artery disease. Past treatments include diuretics. The current treatment provides moderate improvement. There are no compliance problems.  There is no history of angina, kidney disease, CAD/MI, CVA, heart failure, left ventricular hypertrophy, PVD or retinopathy. Identifiable causes of hypertension include a thyroid problem. There is no history of chronic renal disease, a hypertension causing med or renovascular disease.  Hyperlipidemia This is a chronic problem. The current episode started more than 1 year ago. The problem is controlled. Recent lipid tests were reviewed and are normal. She has no history of chronic renal disease, diabetes, hypothyroidism, liver disease, obesity or nephrotic syndrome. Pertinent negatives include no chest pain, myalgias or shortness of breath. Current antihyperlipidemic treatment includes statins. The current treatment provides moderate improvement of lipids. There are no compliance problems.   Diabetes She presents for her follow-up diabetic visit. She has type 2 diabetes mellitus. Her disease course has been stable. There are no hypoglycemic associated symptoms. Pertinent negatives for hypoglycemia include no dizziness, headaches, nervousness/anxiousness or sweats. Associated symptoms include fatigue. Pertinent negatives for diabetes include no blurred vision, no chest pain,  no polydipsia, no visual change and no weight loss. There are no hypoglycemic complications. Symptoms are stable. Pertinent negatives for diabetic complications include no CVA, peripheral neuropathy, PVD or retinopathy. Risk factors for coronary artery disease include diabetes mellitus, dyslipidemia and hypertension. Current diabetic treatment includes oral agent (dual therapy). She is compliant with treatment all of the time. She is following a generally healthy diet. Meal planning includes avoidance of concentrated sweets and carbohydrate counting. An ACE inhibitor/angiotensin II receptor blocker is not being taken. She does not see a podiatrist.Eye exam is current.  Depression        This is a chronic problem.  The onset quality is gradual.   The problem has been gradually improving since onset.  Associated symptoms include fatigue.  Associated symptoms include no decreased concentration, no helplessness, no hopelessness, does not have insomnia, not irritable, no restlessness, no decreased interest, no myalgias, no headaches, not sad and no suicidal ideas.  Past treatments include SSRIs - Selective serotonin reuptake inhibitors.  Compliance with treatment is good.  Past medical history includes thyroid problem.     Pertinent negatives include no hypothyroidism and no anxiety. Thyroid Problem Presents for follow-up visit. Symptoms include cold intolerance and fatigue. Patient reports no anxiety, constipation, diarrhea, dry skin, hair loss, heat intolerance, nail problem, palpitations, visual change, weight gain or weight loss. Her past medical history is significant for hyperlipidemia. There is no history of diabetes or heart failure.   Lab Results  Component Value Date   NA 138 11/03/2020   K 4.8 11/03/2020   CO2 25 11/03/2020   GLUCOSE 124 (H) 11/03/2020   BUN 12 11/03/2020   CREATININE 0.64 11/03/2020   CALCIUM 9.8 11/03/2020   EGFR 100 11/03/2020   GFRNONAA 96 04/14/2020   Lab Results   Component Value Date   CHOL 176 11/03/2020  HDL 75 11/03/2020   LDLCALC 89 11/03/2020   TRIG 60 11/03/2020   CHOLHDL 2.5 02/04/2018   Lab Results  Component Value Date   TSH 0.665 11/03/2020   Lab Results  Component Value Date   HGBA1C 7.3 (H) 11/03/2020   Lab Results  Component Value Date   WBC 5.0 04/14/2020   HGB 11.3 04/14/2020   HCT 35.3 04/14/2020   MCV 86 04/14/2020   PLT 296 04/14/2020   Lab Results  Component Value Date   ALT 14 04/14/2020   AST 22 04/14/2020   ALKPHOS 71 04/14/2020   BILITOT 0.2 04/14/2020   No results found for: 25OHVITD2, 25OHVITD3, VD25OH   Review of Systems  Constitutional:  Positive for fatigue. Negative for chills, fever, malaise/fatigue, weight gain and weight loss.  HENT:  Negative for drooling, ear discharge, ear pain and sore throat.   Eyes:  Negative for blurred vision.  Respiratory:  Negative for cough, shortness of breath and wheezing.   Cardiovascular:  Negative for chest pain, palpitations, orthopnea, leg swelling and PND.  Gastrointestinal:  Negative for abdominal pain, blood in stool, constipation, diarrhea and nausea.  Endocrine: Positive for cold intolerance. Negative for heat intolerance and polydipsia.  Genitourinary:  Negative for dysuria, frequency, hematuria and urgency.  Musculoskeletal:  Negative for back pain, myalgias and neck pain.  Skin:  Negative for rash.  Allergic/Immunologic: Negative for environmental allergies.  Neurological:  Negative for dizziness and headaches.  Hematological:  Does not bruise/bleed easily.  Psychiatric/Behavioral:  Positive for depression. Negative for decreased concentration and suicidal ideas. The patient is not nervous/anxious and does not have insomnia.    Patient Active Problem List   Diagnosis Date Noted   Calculus of gallbladder without cholecystitis without obstruction 06/12/2017   Chronic maxillary sinusitis 03/15/2015   Chronic rhinitis 03/15/2015   Mild intermittent  asthma without complication 03/04/8249   Oroantral fistula 03/15/2015   Essential hypertension 01/05/2015   Thyroid activity decreased 01/05/2015   Hyperlipidemia 01/05/2015   Recurrent major depressive disorder, in partial remission ([place]) 01/05/2015    Allergies  Allergen Reactions   Aspirin Anaphylaxis, Hives and Shortness Of Breath   Biaxin [Clarithromycin] Anaphylaxis, Hives and Shortness Of Breath   Nsaids Hives and Swelling   Penicillins Anaphylaxis, Hives and Swelling    Has patient had a PCN reaction causing immediate rash, facial/tongue/throat swelling, SOB or lightheadedness with hypotension:Yes Has patient had a PCN reaction causing severe rash involving mucus membranes or skin necrosis: Unknown Has patient had a PCN reaction that required hospitalization: Unknown Has patient had a PCN reaction occurring within the last 10 years: No Childhood reaction. If all of the above answers are "NO", then may proceed with Cephalosporin use.    Sulfa Antibiotics Hives   Ancef [Cefazolin] Rash    Past Surgical History:  Procedure Laterality Date   CHOLECYSTECTOMY N/A 06/21/2017   Procedure: LAPAROSCOPIC CHOLECYSTECTOMY WITH INTRAOPERATIVE CHOLANGIOGRAM;  Surgeon: Robert Bellow, MD;  Location: [facility] ORS;  Service: General;  Laterality: N/A;   COLONOSCOPY  2012   cleared for 10 yrs- [place] Bilateral 2018   GASTRIC BYPASS  2013   Dr Alisia Ferrari at [place]  06/21/2017   Procedure: HERNIA REPAIR UMBILICAL ADULT;  Surgeon: Robert Bellow, MD;  Location: [facility] ORS;  Service: General;;   VAGINAL HYSTERECTOMY  ovaries remain    Social History   Tobacco Use   Smoking status: Former    Packs/day: 1.00    Years: 20.00    Pack years: 20.00    Types: Cigarettes    Quit date: 06/18/2010    Years since quitting: 10.8   Smokeless tobacco: Never  Vaping Use   Vaping Use:  Never used  Substance Use Topics   Alcohol use: Not Currently    Alcohol/week: 0.0 standard drinks   Drug use: No     Medication list has been reviewed and updated.  Current Meds  Medication Sig   APPLE CIDER VINEGAR PO Take 2 tablets by mouth daily.   cholecalciferol (VITAMIN D) 1000 UNITS tablet Take 1,000 Units by mouth daily.   CINNAMON PO Take 8,000 mg by mouth daily.   citalopram (CELEXA) 20 MG tablet Take 1 tablet (20 mg total) by mouth daily.   fluticasone (FLONASE) 50 MCG/ACT nasal spray SPRAY 2 SPRAYS INTO EACH NOSTRIL EVERY DAY   glipiZIDE (GLUCOTROL XL) 2.5 MG 24 hr tablet TAKE 1 TABLET BY MOUTH DAILY WITH BREAKFAST. ADD TO METFORMIN. WILL RECHECK IN 8 WEEKS   Glucosamine-Chondroitin (COSAMIN DS PO) Take 2 tablets by mouth daily.   hydrochlorothiazide (HYDRODIURIL) 25 MG tablet Take 1 tablet (25 mg total) by mouth daily.   levothyroxine (SYNTHROID) 75 MCG tablet TAKE 1 TABLET BY MOUTH EVERY DAY   metFORMIN (GLUCOPHAGE-XR) 500 MG 24 hr tablet Take 1 tablet by mouth daily   Omega-3 Fatty Acids (FISH OIL) 1000 MG CAPS Take 1 capsule (1,000 mg total) by mouth daily.   Polyethyl Glycol-Propyl Glycol 0.4-0.3 % SOLN Place 1 drop into both eyes 3 (three) times daily as needed (for dry eyes.).   simvastatin (ZOCOR) 20 MG tablet Take 1 tablet (20 mg total) by mouth daily.   vitamin B-12 (CYANOCOBALAMIN) 500 MCG tablet Take 500 mcg by mouth daily.    PHQ 2/9 Scores 04/06/2021 11/03/2020 04/14/2020 03/07/2020  PHQ - 2 Score 0 0 0 0  PHQ- 9 Score 1 5 0 0    GAD 7 : Generalized Anxiety Score 11/03/2020 04/14/2020 03/07/2020 10/13/2019  Nervous, Anxious, on Edge 0 0 0 0  Control/stop worrying 1 0 0 0  Worry too much - different things 0 0 0 0  Trouble relaxing 0 0 0 0  Restless 0 0 0 0  Easily annoyed or irritable 0 0 0 0  Afraid - awful might happen 0 0 0 0  Total GAD 7 Score 1 0 0 0  Anxiety Difficulty Not difficult at all - Not difficult at all Not difficult at all    BP Readings  from Last 3 Encounters:  04/06/21 130/62  11/03/20 120/80  04/14/20 104/62    Physical Exam Vitals and nursing note reviewed.  Constitutional:      General: She is not irritable.    Appearance: She is well-developed.  HENT:     Head: Normocephalic.     Right Ear: External ear normal.     Left Ear: External ear normal.  Eyes:     General: Lids are everted, no foreign bodies appreciated. No scleral icterus.       Left eye: No foreign body or hordeolum.     Conjunctiva/sclera: Conjunctivae normal.     Right eye: Right conjunctiva is not injected.     Left eye: Left conjunctiva is not injected.     Pupils: Pupils are equal, round, and reactive to light.  Neck:  Thyroid: No thyromegaly.     Vascular: No JVD.     Trachea: No tracheal deviation.  Cardiovascular:     Rate and Rhythm: Normal rate and regular rhythm.     Heart sounds: Normal heart sounds. No murmur heard.   No friction rub. No gallop.  Pulmonary:     Effort: Pulmonary effort is normal. No respiratory distress.     Breath sounds: Normal breath sounds. No wheezing or rales.  Abdominal:     General: Bowel sounds are normal.     Palpations: Abdomen is soft. There is no mass.     Tenderness: There is no abdominal tenderness. There is no guarding or rebound.  Musculoskeletal:        General: No tenderness. Normal range of motion.     Cervical back: Normal range of motion and neck supple.  Lymphadenopathy:     Cervical: No cervical adenopathy.  Skin:    General: Skin is warm.     Findings: No rash.  Neurological:     Mental Status: She is alert and oriented to person, place, and time.     Cranial Nerves: No cranial nerve deficit.     Deep Tendon Reflexes: Reflexes normal.  Psychiatric:        Mood and Affect: Mood is not anxious or depressed.    Wt Readings from Last 3 Encounters:  04/06/21 171 lb (77.6 kg)  11/03/20 171 lb (77.6 kg)  04/14/20 172 lb (78 kg)    BP 130/62    Pulse 72    Ht 5' 3"  (1.6 m)     Wt 171 lb (77.6 kg)    BMI 30.29 kg/m   Assessment and Plan:  1. Essential hypertension Chronic.  Controlled.  Stable.  Blood pressure 130/62.  Continue hydrochlorothiazide 25 mg once a day.  Will check CMP for electrolytes and GFR. - hydrochlorothiazide (HYDRODIURIL) 25 MG tablet; Take 1 tablet (25 mg total) by mouth daily.  Dispense: 90 tablet; Refill: 1 - Comprehensive Metabolic Panel (CMET)  2. Prediabetes Chronic.  Controlled.  Stable.  Continue metformin XR 500 mg once a day and glipizide XL 2.5 mg once a day.  Will check CMP as well as A1c and microalbuminuria. - metFORMIN (GLUCOPHAGE-XR) 500 MG 24 hr tablet; Take 1 tablet by mouth daily  Dispense: 90 tablet; Refill: 1 - Comprehensive Metabolic Panel (CMET) - HgB A1c - Microalbumin, urine  3. Mixed hyperlipidemia Chronic.  Controlled.  Stable.  Continue simvastatin 20 mg once a day.  Will check lipid panel. - simvastatin (ZOCOR) 20 MG tablet; Take 1 tablet (20 mg total) by mouth daily.  Dispense: 90 tablet; Refill: 1 - Lipid Panel With LDL/HDL Ratio  4. Hypothyroidism, unspecified type Chronic.  Controlled.  Stable.  Continue thyroid panel with TSH we will likely continue levothyroxine 75 mcg once a day. - levothyroxine (SYNTHROID) 75 MCG tablet; TAKE 1 TABLET BY MOUTH EVERY DAY  Dispense: 90 tablet; Refill: 1 - Thyroid Panel With TSH  5. Recurrent major depressive disorder, in partial remission (HCC) Chronic.  Controlled.  Stable.  PHQ is 1 Gad score is 0 continue Celexa 20 mg once a day. - citalopram (CELEXA) 20 MG tablet; Take 1 tablet (20 mg total) by mouth daily.  Dispense: 90 tablet; Refill: 1  6. Uncontrolled type 2 diabetes mellitus with hyperglycemia ([place]) As noted above.  Chronic.  Controlled.  Stable.  Course of the diseases that is mild in nature and intermittent and activity.  There is no  complications and is currently controlled with below medication - glipiZIDE (GLUCOTROL XL) 2.5 MG 24 hr tablet; TAKE 1 TABLET  BY MOUTH DAILY WITH BREAKFAST. ADD TO METFORMIN. WILL RECHECK IN 8 WEEKS  Dispense: 90 tablet; Refill: 1  7. Mild intermittent reactive airway disease without complication Chronic.  Controlled.  Mild in nature.  And intermittent and activity.  We will continue Singulair 10 mg once a day. - montelukast (SINGULAIR) 10 MG tablet; TAKE 1 TABLET BY MOUTH EVERYDAY AT BEDTIME  Dispense: 90 tablet; Refill: 1  8. Seasonal allergic rhinitis due to pollen Chronic.  Episodic.  Patient's been encouraged to continue on a daily basis Flonase nasal spray 2 sprays each nostril daily. - fluticasone (FLONASE) 50 MCG/ACT nasal spray; SPRAY 2 SPRAYS INTO EACH NOSTRIL EVERY DAY  Dispense: 15.8 mL; Refill: 0

## 2021-04-07 LAB — LIPID PANEL WITH LDL/HDL RATIO
Cholesterol, Total: 180 mg/dL (ref 100–199)
HDL: 77 mg/dL (ref >39)
LDL Chol Calc (NIH): 89 mg/dL (ref 0–99)
LDL/HDL Ratio: 1.2 ratio (ref 0.0–3.2)
Triglycerides: 78 mg/dL (ref 0–149)
VLDL Cholesterol Cal: 14 mg/dL (ref 5–40)

## 2021-04-07 LAB — COMPREHENSIVE METABOLIC PANEL
ALT: 12 IU/L (ref 0–32)
AST: 22 IU/L (ref 0–40)
Albumin/Globulin Ratio: 1.6 (ref 1.2–2.2)
Albumin: 4.5 g/dL (ref 3.8–4.8)
Alkaline Phosphatase: 78 IU/L (ref 44–121)
BUN/Creatinine Ratio: 19 (ref 12–28)
BUN: 13 mg/dL (ref 8–27)
Bilirubin Total: 0.3 mg/dL (ref 0.0–1.2)
CO2: 27 mmol/L (ref 20–29)
Calcium: 9.9 mg/dL (ref 8.7–10.3)
Chloride: 98 mmol/L (ref 96–106)
Creatinine, Ser: 0.7 mg/dL (ref 0.57–1.00)
Globulin, Total: 2.9 g/dL (ref 1.5–4.5)
Glucose: 111 mg/dL — ABNORMAL HIGH (ref 70–99)
Potassium: 4.5 mmol/L (ref 3.5–5.2)
Sodium: 138 mmol/L (ref 134–144)
Total Protein: 7.4 g/dL (ref 6.0–8.5)
eGFR: 97 mL/min/{1.73_m2} (ref >59)

## 2021-04-07 LAB — HEMOGLOBIN A1C
Est. average glucose Bld gHb Est-mCnc: 157 mg/dL
Hgb A1c MFr Bld: 7.1 % — ABNORMAL HIGH (ref 4.8–5.6)

## 2021-04-07 LAB — THYROID PANEL WITH TSH
Free Thyroxine Index: 2.1 (ref 1.2–4.9)
T3 Uptake Ratio: 25 % (ref 24–39)
T4, Total: 8.2 ug/dL (ref 4.5–12.0)
TSH: 0.291 u[IU]/mL — ABNORMAL LOW (ref 0.450–4.500)

## 2021-04-07 LAB — MICROALBUMIN, URINE: Microalbumin, Urine: 13.2 ug/mL

## 2021-04-10 ENCOUNTER — Other Ambulatory Visit: Payer: Self-pay

## 2021-04-10 DIAGNOSIS — E039 Hypothyroidism, unspecified: Secondary | ICD-10-CM

## 2021-04-10 MED ORDER — LEVOTHYROXINE SODIUM 75 MCG PO TABS: ORAL_TABLET | ORAL | 1 refills | Status: DC

## 2021-04-10 MED ORDER — LEVOTHYROXINE SODIUM 50 MCG PO TABS: 50.0000 ug | ORAL_TABLET | Freq: Every day | ORAL | 0 refills | Status: DC

## 2021-05-01 ENCOUNTER — Other Ambulatory Visit: Payer: Self-pay | Admitting: Family Medicine

## 2021-05-01 DIAGNOSIS — J301 Allergic rhinitis due to pollen: Secondary | ICD-10-CM

## 2021-05-02 NOTE — Telephone Encounter (Signed)
Requested medications are due for refill today.  yes  Requested medications are on the active medications list.  yes  Last refill. 04/06/2021 15.6 /0 refills  Future visit scheduled.   yes  Notes to clinic.  Medication not delegated.    Requested Prescriptions  Pending Prescriptions Disp Refills   fluticasone (FLONASE) 50 MCG/ACT nasal spray [Pharmacy Med Name: FLUTICASONE PROP 50 MCG SPRAY] 16 mL     Sig: SPRAY 2 SPRAYS INTO EACH NOSTRIL EVERY DAY     Not Delegated - Ear, Nose, and Throat: Nasal Preparations - Corticosteroids Failed - 05/01/2021  1:30 PM      Failed - This refill cannot be delegated      Passed - Valid encounter within last 12 months    Recent Outpatient Visits           3 weeks ago Essential hypertension   [facility] Duanne Limerick, MD   6 months ago Essential hypertension   [facility] Duanne Limerick, MD   1 year ago Essential hypertension   [facility] Duanne Limerick, MD   1 year ago Acute non-recurrent maxillary sinusitis   [facility] Duanne Limerick, MD   1 year ago Chest pain, unspecified type   [facility] Duanne Limerick, MD       Future Appointments             In 3 months Duanne Limerick, MD [facility]

## 2021-05-10 DIAGNOSIS — M2011 Hallux valgus (acquired), right foot: Secondary | ICD-10-CM | POA: Diagnosis not present

## 2021-05-10 DIAGNOSIS — M19071 Primary osteoarthritis, right ankle and foot: Secondary | ICD-10-CM | POA: Diagnosis not present

## 2021-05-10 DIAGNOSIS — Q66229 Congenital metatarsus adductus, unspecified foot: Secondary | ICD-10-CM | POA: Diagnosis not present

## 2021-05-27 ENCOUNTER — Other Ambulatory Visit: Payer: Self-pay | Admitting: Family Medicine

## 2021-05-27 DIAGNOSIS — J301 Allergic rhinitis due to pollen: Secondary | ICD-10-CM

## 2021-06-23 ENCOUNTER — Other Ambulatory Visit: Payer: Self-pay | Admitting: Family Medicine

## 2021-06-23 DIAGNOSIS — J301 Allergic rhinitis due to pollen: Secondary | ICD-10-CM

## 2021-07-05 DIAGNOSIS — M19071 Primary osteoarthritis, right ankle and foot: Secondary | ICD-10-CM | POA: Diagnosis not present

## 2021-07-05 DIAGNOSIS — M2011 Hallux valgus (acquired), right foot: Secondary | ICD-10-CM | POA: Diagnosis not present

## 2021-07-05 DIAGNOSIS — Q66229 Congenital metatarsus adductus, unspecified foot: Secondary | ICD-10-CM | POA: Diagnosis not present

## 2021-07-06 ENCOUNTER — Other Ambulatory Visit: Payer: Self-pay | Admitting: Podiatry

## 2021-07-12 ENCOUNTER — Encounter: Payer: Self-pay | Admitting: Podiatry

## 2021-07-13 NOTE — Discharge Instructions (Signed)
[facility]  POST OPERATIVE INSTRUCTIONS FOR DR. FOWLER AND DR[facility]OD[facility]   Take your medication as prescribed.  Pain medication should be taken only as needed.  Keep the dressing clean, dry and intact.  Keep your foot elevated above the heart level for the first 48 hours.  Walking to the bathroom and brief periods of walking are acceptable, unless we have instructed you to be non-weight bearing.  Always wear your post-op shoe when walking.  Always use your crutches if you are to be non-weight bearing.  Do not take a shower. Baths are permissible as long as the foot is kept out of the water.   Every hour you are awake:  Bend your knee 15 times. Flex foot 15 times Massage calf 15 times  Call [facility] ([phone]) if any of the following problems occur: You develop a temperature or fever. The bandage becomes saturated with blood. Medication does not stop your pain. Injury of the foot occurs. Any symptoms of infection including redness, odor, or red streaks running from wound. 

## 2021-07-19 ENCOUNTER — Ambulatory Visit: Payer: BC Managed Care – PPO | Admitting: Anesthesiology

## 2021-07-19 ENCOUNTER — Encounter: Payer: Self-pay | Admitting: Podiatry

## 2021-07-19 ENCOUNTER — Ambulatory Visit: Payer: Self-pay

## 2021-07-19 ENCOUNTER — Other Ambulatory Visit: Payer: Self-pay

## 2021-07-19 ENCOUNTER — Encounter
Admission: RE | Disposition: A | Discharge status: Home / Self Care | Payer: Self-pay | Source: Home / Self Care | Attending: Podiatry

## 2021-07-19 ENCOUNTER — Ambulatory Visit
Admission: RE | Admit: 2021-07-19 | Discharge: 2021-07-19 | Disposition: A | Discharge status: Home / Self Care | Payer: BC Managed Care – PPO | Attending: Podiatry | Admitting: Podiatry

## 2021-07-19 DIAGNOSIS — E785 Hyperlipidemia, unspecified: Secondary | ICD-10-CM | POA: Insufficient documentation

## 2021-07-19 DIAGNOSIS — E119 Type 2 diabetes mellitus without complications: Secondary | ICD-10-CM | POA: Diagnosis not present

## 2021-07-19 DIAGNOSIS — F32A Depression, unspecified: Secondary | ICD-10-CM | POA: Insufficient documentation

## 2021-07-19 DIAGNOSIS — I10 Essential (primary) hypertension: Secondary | ICD-10-CM | POA: Insufficient documentation

## 2021-07-19 DIAGNOSIS — K219 Gastro-esophageal reflux disease without esophagitis: Secondary | ICD-10-CM | POA: Insufficient documentation

## 2021-07-19 DIAGNOSIS — M19071 Primary osteoarthritis, right ankle and foot: Secondary | ICD-10-CM | POA: Diagnosis not present

## 2021-07-19 DIAGNOSIS — K449 Diaphragmatic hernia without obstruction or gangrene: Secondary | ICD-10-CM | POA: Diagnosis not present

## 2021-07-19 DIAGNOSIS — Z87891 Personal history of nicotine dependence: Secondary | ICD-10-CM | POA: Diagnosis not present

## 2021-07-19 DIAGNOSIS — Q66229 Congenital metatarsus adductus, unspecified foot: Secondary | ICD-10-CM | POA: Diagnosis not present

## 2021-07-19 DIAGNOSIS — M19271 Secondary osteoarthritis, right ankle and foot: Secondary | ICD-10-CM | POA: Diagnosis not present

## 2021-07-19 DIAGNOSIS — Q66221 Congenital metatarsus adductus, right foot: Secondary | ICD-10-CM | POA: Insufficient documentation

## 2021-07-19 DIAGNOSIS — M2011 Hallux valgus (acquired), right foot: Secondary | ICD-10-CM | POA: Diagnosis not present

## 2021-07-19 HISTORY — PX: FOOT ARTHRODESIS: SHX1655

## 2021-07-19 HISTORY — DX: Presence of dental prosthetic device (complete) (partial): Z97.2

## 2021-07-19 HISTORY — PX: BUNIONECTOMY: SHX129

## 2021-07-19 LAB — GLUCOSE, CAPILLARY
Glucose-Capillary: 115 mg/dL — ABNORMAL HIGH (ref 70–99)
Glucose-Capillary: 133 mg/dL — ABNORMAL HIGH (ref 70–99)

## 2021-07-19 SURGERY — BUNIONECTOMY
Anesthesia: General | Site: Toe | Laterality: Right

## 2021-07-19 MED ORDER — OXYCODONE HCL 5 MG PO TABS: 5.0000 mg | ORAL_TABLET | Freq: Once | ORAL | Status: DC | PRN

## 2021-07-19 MED ORDER — PROPOFOL 10 MG/ML IV BOLUS
INTRAVENOUS | Status: DC | PRN
  Administered 2021-07-19: 200 mg via INTRAVENOUS

## 2021-07-19 MED ORDER — LACTATED RINGERS IV SOLN: INTRAVENOUS | Status: DC

## 2021-07-19 MED ORDER — FENTANYL CITRATE (PF) 100 MCG/2ML IJ SOLN
INTRAMUSCULAR | Status: DC | PRN
  Administered 2021-07-19: 100 ug via INTRAVENOUS
  Administered 2021-07-19: 50 ug via INTRAVENOUS

## 2021-07-19 MED ORDER — BUPIVACAINE HCL (PF) 0.25 % IJ SOLN
INTRAMUSCULAR | Status: DC | PRN
  Administered 2021-07-19 (×2): 10 mL

## 2021-07-19 MED ORDER — OXYCODONE HCL 5 MG/5ML PO SOLN: 5.0000 mg | Freq: Once | ORAL | Status: DC | PRN

## 2021-07-19 MED ORDER — MIDAZOLAM HCL 5 MG/5ML IJ SOLN
INTRAMUSCULAR | Status: DC | PRN
  Administered 2021-07-19: 2 mg via INTRAVENOUS

## 2021-07-19 MED ORDER — VANCOMYCIN HCL IN DEXTROSE 1-5 GM/200ML-% IV SOLN
1000.0000 mg | INTRAVENOUS | Status: AC
  Administered 2021-07-19: 1000 mg via INTRAVENOUS

## 2021-07-19 MED ORDER — GLYCOPYRROLATE 0.2 MG/ML IJ SOLN
INTRAMUSCULAR | Status: DC | PRN
  Administered 2021-07-19 (×2): .1 mg via INTRAVENOUS

## 2021-07-19 MED ORDER — OXYCODONE-ACETAMINOPHEN 5-325 MG PO TABS: 1.0000 | ORAL_TABLET | Freq: Four times a day (QID) | ORAL | 0 refills | Status: DC | PRN

## 2021-07-19 MED ORDER — BUPIVACAINE LIPOSOME 1.3 % IJ SUSP
INTRAMUSCULAR | Status: DC | PRN
  Administered 2021-07-19 (×2): 10 mL

## 2021-07-19 MED ORDER — FENTANYL CITRATE PF 50 MCG/ML IJ SOSY: 25.0000 ug | PREFILLED_SYRINGE | INTRAMUSCULAR | Status: DC | PRN

## 2021-07-19 MED ORDER — ONDANSETRON HCL 4 MG/2ML IJ SOLN
INTRAMUSCULAR | Status: DC | PRN
  Administered 2021-07-19: 4 mg via INTRAVENOUS

## 2021-07-19 MED ORDER — ACETAMINOPHEN 10 MG/ML IV SOLN
INTRAVENOUS | Status: DC | PRN
  Administered 2021-07-19: 1000 mg via INTRAVENOUS

## 2021-07-19 MED ORDER — EPHEDRINE SULFATE (PRESSORS) 50 MG/ML IJ SOLN
INTRAMUSCULAR | Status: DC | PRN
  Administered 2021-07-19: 5 mg via INTRAVENOUS
  Administered 2021-07-19 (×4): 10 mg via INTRAVENOUS

## 2021-07-19 MED ORDER — SODIUM CHLORIDE 0.9 % IR SOLN
Status: DC | PRN
  Administered 2021-07-19: 500 mL

## 2021-07-19 MED ORDER — LIDOCAINE HCL (CARDIAC) PF 100 MG/5ML IV SOSY
PREFILLED_SYRINGE | INTRAVENOUS | Status: DC | PRN
  Administered 2021-07-19: 40 mg via INTRATRACHEAL

## 2021-07-19 SURGICAL SUPPLY — 45 items
APL SKNCLS STERI-STRIP NONHPOA (GAUZE/BANDAGES/DRESSINGS) ×2
BENZOIN TINCTURE PRP APPL 2/3 (GAUZE/BANDAGES/DRESSINGS) ×3 IMPLANT
BLADE SAW LAPIPLASTY 40X11 (BLADE) ×1 IMPLANT
BLADE SURG 15 STRL LF DISP TIS (BLADE) IMPLANT
BLADE SURG 15 STRL SS (BLADE) ×9
BNDG CMPR 75X41 PLY HI ABS (GAUZE/BANDAGES/DRESSINGS) ×2
BNDG COHESIVE 4X5 TAN ST LF (GAUZE/BANDAGES/DRESSINGS) ×3 IMPLANT
BNDG ELASTIC 4X5.8 VLCR STR LF (GAUZE/BANDAGES/DRESSINGS) ×3 IMPLANT
BNDG ESMARK 4X12 TAN STRL LF (GAUZE/BANDAGES/DRESSINGS) ×3 IMPLANT
BNDG GAUZE ELAST 4 BULKY (GAUZE/BANDAGES/DRESSINGS) ×3 IMPLANT
BNDG STRETCH 4X75 STRL LF (GAUZE/BANDAGES/DRESSINGS) ×3 IMPLANT
CANISTER SUCT 1200ML W/VALVE (MISCELLANEOUS) ×3 IMPLANT
COVER LIGHT HANDLE UNIVERSAL (MISCELLANEOUS) ×6 IMPLANT
CUFF TOURN SGL QUICK 18X4 (TOURNIQUET CUFF) ×1 IMPLANT
DRAPE FLUOR MINI C-ARM 54X84 (DRAPES) ×3 IMPLANT
DURAPREP 26ML APPLICATOR (WOUND CARE) ×3 IMPLANT
ELECT REM PT RETURN 9FT ADLT (ELECTROSURGICAL) ×3
ELECTRODE REM PT RTRN 9FT ADLT (ELECTROSURGICAL) ×2 IMPLANT
GAUZE SPONGE 4X4 12PLY STRL (GAUZE/BANDAGES/DRESSINGS) ×3 IMPLANT
GAUZE XEROFORM 1X8 LF (GAUZE/BANDAGES/DRESSINGS) ×3 IMPLANT
GLOVE SRG 8 PF TXTR STRL LF DI (GLOVE) ×4 IMPLANT
GLOVE SURG ENC MOIS LTX SZ7.5 (GLOVE) ×6 IMPLANT
GLOVE SURG UNDER POLY LF SZ8 (GLOVE) ×6
GOWN STRL REUS W/ TWL LRG LVL3 (GOWN DISPOSABLE) ×4 IMPLANT
GOWN STRL REUS W/TWL LRG LVL3 (GOWN DISPOSABLE) ×6
IMPL LAPIPLASTY LESSER TMT FIX (Orthopedic Implant) ×2 IMPLANT
KIT TURNOVER KIT A (KITS) ×3 IMPLANT
LAPIPLASTY SYS 4A (Orthopedic Implant) ×3 IMPLANT
NS IRRIG 500ML POUR BTL (IV SOLUTION) ×3 IMPLANT
PACK EXTREMITY ARMC (MISCELLANEOUS) ×3 IMPLANT
PENCIL SMOKE EVACUATOR (MISCELLANEOUS) ×1 IMPLANT
RASP SM TEAR CROSS CUT (RASP) ×1 IMPLANT
SCREW 2.7 HIGH PITCH LOCKING (Screw) ×1 IMPLANT
SCREW HIGH PITCH LOCK 2.7 (Screw) ×1 IMPLANT
STOCKINETTE IMPERVIOUS LG (DRAPES) ×3 IMPLANT
STRIP CLOSURE SKIN 1/4X4 (GAUZE/BANDAGES/DRESSINGS) ×3 IMPLANT
SUT ETHILON 3-0 (SUTURE) ×1 IMPLANT
SUT MNCRL 4-0 (SUTURE) ×6
SUT MNCRL 4-0 27XMFL (SUTURE) ×4
SUT VIC AB 3-0 SH 27 (SUTURE) ×6
SUT VIC AB 3-0 SH 27X BRD (SUTURE) IMPLANT
SUT VIC AB 4-0 SH 27 (SUTURE) ×6
SUT VIC AB 4-0 SH 27XANBCTRL (SUTURE) IMPLANT
SUTURE MNCRL 4-0 27XMF (SUTURE) IMPLANT
SYSTEM LAPIPLASTY 4A (Orthopedic Implant) IMPLANT

## 2021-07-19 NOTE — Anesthesia Postprocedure Evaluation (Signed)
Anesthesia Post Note ? ?Patient: Phyllis Wagner ? ?Procedure(s) Performed: BUNIONECTOMY - LAPIDUS-TYPE (Right: Toe) ?ARTHRODESIS FOOT; LISFRANC; MULTIPLE (Right: Foot) ? ? ?  ?Patient location during evaluation: PACU ?Anesthesia Type: General ?Level of consciousness: awake ?Pain management: pain level controlled ?Vital Signs Assessment: post-procedure vital signs reviewed and stable ?Respiratory status: respiratory function stable ?Cardiovascular status: stable ?Postop Assessment: no signs of nausea or vomiting ?Anesthetic complications: no ? ? ?No notable events documented. ? ?[name] ? ? ? ? ? ?

## 2021-07-19 NOTE — Anesthesia Preprocedure Evaluation (Signed)
Anesthesia Evaluation  ?Patient identified by MRN, date of birth, ID band ?Patient awake ? ? ? ?Reviewed: ?Allergy & Precautions, NPO status  ? ?Airway ?Mallampati: II ? ?TM Distance: >3 FB ? ? ? ? Dental ?  ?Pulmonary ?former smoker,  ?  ?Pulmonary exam normal ? ? ? ? ? ? ? Cardiovascular ?hypertension,  ?Rhythm:Regular Rate:Normal ? ?HLD ?  ?Neuro/Psych ?PSYCHIATRIC DISORDERS Depression   ? GI/Hepatic ?hiatal hernia, GERD  ,  ?Endo/Other  ?diabetes, Type 2Hypothyroidism BMI > 30 ? Renal/GU ?  ? ?  ?Musculoskeletal ? ? Abdominal ?  ?Peds ? Hematology ?  ?Anesthesia Other Findings ? ? Reproductive/Obstetrics ? ?  ? ? ? ? ? ? ? ? ? ? ? ? ? ?  ?  ? ? ? ? ? ? ? ? ?Anesthesia Physical ?Anesthesia Plan ? ?ASA: 3 ? ?Anesthesia Plan: General  ? ?Post-op Pain Management:   ? ?Induction: Intravenous ? ?PONV Risk Score and Plan: Treatment may vary due to age or medical condition ? ?Airway Management Planned: LMA ? ?Additional Equipment:  ? ?Intra-op Plan:  ? ?Post-operative Plan:  ? ?Informed Consent: I have reviewed the patients History and Physical, chart, labs and discussed the procedure including the risks, benefits and alternatives for the proposed anesthesia with the patient or authorized representative who has indicated his/her understanding and acceptance.  ? ? ? ?Dental advisory given ? ?Plan Discussed with: CRNA ? ?Anesthesia Plan Comments:   ? ? ? ? ? ? ?Anesthesia Quick Evaluation ? ?

## 2021-07-19 NOTE — Transfer of Care (Signed)
Immediate Anesthesia Transfer of Care Note ? ?Patient: Phyllis Wagner ? ?Procedure(s) Performed: BUNIONECTOMY - LAPIDUS-TYPE (Right: Toe) ?ARTHRODESIS FOOT; LISFRANC; MULTIPLE (Right: Foot) ? ?Patient Location: PACU ? ?Anesthesia Type: General ? ?Level of Consciousness: awake, alert  and patient cooperative ? ?Airway and Oxygen Therapy: Patient Spontanous Breathing and Patient connected to supplemental oxygen ? ?Post-op Assessment: Post-op Vital signs reviewed, Patient's Cardiovascular Status Stable, Respiratory Function Stable, Patent Airway and No signs of Nausea or vomiting ? ?Post-op Vital Signs: Reviewed and stable ? ?Complications: No notable events documented. ? ?

## 2021-07-19 NOTE — Anesthesia Procedure Notes (Signed)
Procedure Name: LMA Insertion ?Date/Time: 07/19/2021 12:16 PM ?Performed by: Michaele Offer, CRNA ?Pre-anesthesia Checklist: Patient identified, Emergency Drugs available, Suction available, Patient being monitored and Timeout performed ?Patient Re-evaluated:Patient Re-evaluated prior to induction ?Oxygen Delivery Method: Circle system utilized ?Preoxygenation: Pre-oxygenation with 100% oxygen ?Induction Type: IV induction ?Ventilation: Mask ventilation without difficulty ?LMA: LMA inserted ?LMA Size: 4.0 ?Number of attempts: 1 ?Placement Confirmation: positive ETCO2 and breath sounds checked- equal and bilateral ?Tube secured with: Tape ?Dental Injury: Teeth and Oropharynx as per pre-operative assessment  ? ? ? ? ?

## 2021-07-19 NOTE — Op Note (Signed)
Operative note ? ? Surgeon:Ronae Noell Vickki Muff ? ?  Assistant: None ? ?  Preop diagnosis: 1.  Hallux valgus deformity right foot 2.  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 ?  Postop diagnosis: Same ? ?  Procedure: 1.  Lapidus hallux valgus correction right foot 2.  Second metatarsal cuneiform joint fusion right foot 3.  Third metatarsal cuneiform joint fusion right foot 4.  Intraoperative fluoroscopy use ? ?  EBL: Minimal ? ?  Anesthesia:local and general ? ?  Hemostasis: Mid calf tourniquet inflated to 200 mmHg for 120 minutes ? ?  Specimen: None ? ?  Complications: None ? ?  Operative indications:Phyllis Wagner is an 66 y.o. that presents today for surgical intervention.  The risks/benefits/alternatives/complications have been discussed and consent has been given. ? ?  Procedure:  ?Patient was brought into the OR and placed on the operating table in thesupine position. After anesthesia was obtained theright lower extremity was prepped and draped in usual sterile fashion. ? ?Attention was initially directed to the dorsal aspect of the midfoot where a 6 cm incision was placed from the proximal cuneiform to the midshaft of the second and third metatarsals.  Sharp and blunt dissection carried down to the deep fascial layers.  Subfascial and subperiosteal dissection was then performed.  At this time a large amount of para-articular spurring was noted to both met cuneiform joints in the region.  These were excised.  Next from the adductor plasty set a small cut was made to the notch between the second and third metatarsals.  Next the cut guide for the adductor plasty was placed in the second and third metatarsal cuneiform joints.  Dorsal to plantar cuts were made.  A small wedge of bone was removed from the joint areas.  The wound was flushed with copious amounts of irrigation.  Realignment osteotomies were noted after manual compression under fluoroscopy.   At this time the joints were prepared with a small drill bit.  A olive wire compression wire was placed between the third metatarsal cuneiform joint with good compression and stability with better realignment noted.  A dorsal locking plate was placed from the adductor plasty set.  Same procedure was performed to the second metatarsocuneiform joint at this time with good compression noted and a dorsal plate placed.  The wound was flushed with copious amounts of irrigation.  The olive wire was left in the second met cuneiform joint for further stabilization throughout the remaining procedure. ? ?Attention was then directed to the dorsal medial aspect of the first met cuneiform joint where an incision was performed.  Sharp and blunt dissection carried down to the periosteum.  Subperiosteal dissection was performed.  The first met cuneiform joint was then opened and released.  A medial incision was placed along the first MTPJ.  Sharp and blunt dissection carried down to the capsule.  The intermetatarsal space was entered.  The conjoined tendon of the abductor was released.  The sesamoid suspensory ligament was released and the sesamoid was freed of the soft tissue.  Realignment of the great toe was noted with more flexibility.  Attention was directed to the medial first metatarsal head where a large prominent eminence was noted.  This was smoothed with a power saw and power rasp.  Attention was redirected to the first met cuneiform joint where realignment was performed with the compression guide.  The cut guide was placed into the met cuneiform joint and a small  sliver of bone was removed at the base of the first metatarsal and distal cuneiform.  Good realignment was noted with compression manually.  At this time the joint was prepared.  Using standard technique with the [place] plasty set a dorsal and medial locking plate was placed.  Excellent realignment of the first MTPJ was noted.  All wounds were then flushed with  copious amounts of irrigation.  Good realignment of the MTPJ was demonstrated with fluoroscopy.  Wounds were then closed with a combination of 3-0 Vicryl, 4-0 Vicryl, and a 4-0 Monocryl for the skin.  All areas were infiltrated with 0.25% bupivacaine and Exparel long-acting anesthetic.  A bulky sterile dressing was applied.  She was then placed in an equalizer walker boot. ? ?  Patient tolerated the procedure and anesthesia well.  Was transported from the OR to the PACU with all vital signs stable and vascular status intact. To be discharged per routine protocol.  Will follow up in approximately 1 week in the outpatient clinic. ? ?

## 2021-07-20 ENCOUNTER — Encounter: Payer: Self-pay | Admitting: Podiatry

## 2021-07-20 NOTE — H&P (Signed)
?  HISTORY AND PHYSICAL INTERVAL NOTE: ? ?07/20/2021 ? ?10:15 AM ? ?Phyllis Wagner  has presented today for surgery, with the diagnosis of M20.11 - Hallux valgus of right foot ?M19.071 - Primary osteoarthritis of right foot ?[id number] - Metatarsus adductus.  The various methods of treatment have been discussed with the patient.  No guarantees were given.  After consideration of risks, benefits and other options for treatment, the patient has consented to surgery.  I have reviewed the patients? chart and labs.   ? ? ?Wagner history and physical examination was performed in my office.  The patient was reexamined.  There have been no changes to this history and physical examination. ? ?Phyllis Wagner ? ?This has been signed after the case was completed but the pre-op H & P was performed by myself in the pre-op holding area. ? ?

## 2021-07-24 DIAGNOSIS — M2011 Hallux valgus (acquired), right foot: Secondary | ICD-10-CM | POA: Diagnosis not present

## 2021-07-29 ENCOUNTER — Other Ambulatory Visit: Payer: Self-pay | Admitting: Family Medicine

## 2021-07-29 DIAGNOSIS — J301 Allergic rhinitis due to pollen: Secondary | ICD-10-CM

## 2021-08-10 DIAGNOSIS — E119 Type 2 diabetes mellitus without complications: Secondary | ICD-10-CM | POA: Diagnosis not present

## 2021-08-15 ENCOUNTER — Encounter: Payer: Self-pay | Admitting: Family Medicine

## 2021-08-15 ENCOUNTER — Ambulatory Visit (INDEPENDENT_AMBULATORY_CARE_PROVIDER_SITE_OTHER): Payer: BC Managed Care – PPO | Admitting: Family Medicine

## 2021-08-15 VITALS — BP 128/72 | HR 82 | Ht 63.0 in | Wt 171.0 lb

## 2021-08-15 DIAGNOSIS — E119 Type 2 diabetes mellitus without complications: Secondary | ICD-10-CM | POA: Diagnosis not present

## 2021-08-15 DIAGNOSIS — E1165 Type 2 diabetes mellitus with hyperglycemia: Secondary | ICD-10-CM

## 2021-08-15 DIAGNOSIS — E039 Hypothyroidism, unspecified: Secondary | ICD-10-CM | POA: Diagnosis not present

## 2021-08-15 DIAGNOSIS — J320 Chronic maxillary sinusitis: Secondary | ICD-10-CM

## 2021-08-15 MED ORDER — DOXYCYCLINE HYCLATE 100 MG PO TABS: 100.0000 mg | ORAL_TABLET | Freq: Two times a day (BID) | ORAL | 0 refills | Status: DC

## 2021-08-15 MED ORDER — METFORMIN HCL ER 500 MG PO TB24: ORAL_TABLET | ORAL | 0 refills | Status: DC

## 2021-08-15 MED ORDER — GLIPIZIDE ER 2.5 MG PO TB24: ORAL_TABLET | ORAL | 0 refills | Status: DC

## 2021-08-15 NOTE — Progress Notes (Signed)
Date:  08/15/2021   Name:  Phyllis Wagner   DOB:  Jul 01, 1955   MRN:  [id number]   Chief Complaint: Diabetes, Hypothyroidism, and tooth infection (Has appt with ENT on July 8- has had Amox x 3 days)  Diabetes She presents for her follow-up diabetic visit. She has type 2 diabetes mellitus. Her disease course has been stable. There are no hypoglycemic associated symptoms. Pertinent negatives for hypoglycemia include no dizziness, headaches or nervousness/anxiousness. Associated symptoms include fatigue. Pertinent negatives for diabetes include no chest pain, no polydipsia and no weight loss. There are no hypoglycemic complications. Symptoms are stable. There are no diabetic complications. Current diabetic treatment includes oral agent (dual therapy). She is compliant with treatment most of the time. She is following a generally healthy diet. Meal planning includes avoidance of concentrated sweets and carbohydrate counting. Her breakfast blood glucose is taken between 8-9 am. Her breakfast blood glucose range is generally 110-130 mg/dl. An ACE inhibitor/angiotensin II receptor blocker is not being taken.  Thyroid Problem Presents for follow-up visit. Symptoms include cold intolerance, diaphoresis and fatigue. Patient reports no anxiety, constipation, diarrhea, dry skin, hair loss, nail problem, palpitations, weight gain or weight loss. The symptoms have been stable.  Sinusitis This is a chronic problem. The current episode started in the past 7 days. The problem has been gradually worsening since onset. There has been no fever. The pain is moderate. Associated symptoms include congestion and diaphoresis. Pertinent negatives include no chills, coughing, ear pain, headaches, neck pain, shortness of breath, sinus pressure or sore throat.   Lab Results  Component Value Date   NA 138 04/06/2021   K 4.5 04/06/2021   CO2 27 04/06/2021   GLUCOSE 111 (H) 04/06/2021   BUN 13 04/06/2021    CREATININE 0.70 04/06/2021   CALCIUM 9.9 04/06/2021   EGFR 97 04/06/2021   GFRNONAA 96 04/14/2020   Lab Results  Component Value Date   CHOL 180 04/06/2021   HDL 77 04/06/2021   LDLCALC 89 04/06/2021   TRIG 78 04/06/2021   CHOLHDL 2.5 02/04/2018   Lab Results  Component Value Date   TSH 0.291 (L) 04/06/2021   Lab Results  Component Value Date   HGBA1C 7.1 (H) 04/06/2021   Lab Results  Component Value Date   WBC 5.0 04/14/2020   HGB 11.3 04/14/2020   HCT 35.3 04/14/2020   MCV 86 04/14/2020   PLT 296 04/14/2020   Lab Results  Component Value Date   ALT 12 04/06/2021   AST 22 04/06/2021   ALKPHOS 78 04/06/2021   BILITOT 0.3 04/06/2021   No results found for: 25OHVITD2, 25OHVITD3, VD25OH   Review of Systems  Constitutional:  Positive for diaphoresis and fatigue. Negative for chills, fever, weight gain and weight loss.  HENT:  Positive for congestion. Negative for drooling, ear discharge, ear pain, sinus pressure and sore throat.   Respiratory:  Negative for cough, shortness of breath and wheezing.   Cardiovascular:  Negative for chest pain, palpitations and leg swelling.  Gastrointestinal:  Negative for abdominal pain, blood in stool, constipation, diarrhea and nausea.  Endocrine: Positive for cold intolerance. Negative for polydipsia.  Genitourinary:  Negative for dysuria, frequency, hematuria and urgency.  Musculoskeletal:  Negative for back pain, myalgias and neck pain.  Skin:  Negative for rash.  Allergic/Immunologic: Negative for environmental allergies.  Neurological:  Negative for dizziness and headaches.  Hematological:  Does not bruise/bleed easily.  Psychiatric/Behavioral:  Negative for suicidal ideas. The patient is  not nervous/anxious.    Patient Active Problem List   Diagnosis Date Noted   Calculus of gallbladder without cholecystitis without obstruction 06/12/2017   Chronic maxillary sinusitis 03/15/2015   Chronic rhinitis 03/15/2015   Mild  intermittent asthma without complication 78/67/6720   Oroantral fistula 03/15/2015   Essential hypertension 01/05/2015   Thyroid activity decreased 01/05/2015   Hyperlipidemia 01/05/2015   Recurrent major depressive disorder, in partial remission ([place]) 01/05/2015    Allergies  Allergen Reactions   Aspirin Anaphylaxis, Hives and Shortness Of Breath   Biaxin [Clarithromycin] Anaphylaxis, Hives and Shortness Of Breath   Nsaids Hives and Swelling   Penicillins Anaphylaxis, Hives and Swelling    Has patient had a PCN reaction causing immediate rash, facial/tongue/throat swelling, SOB or lightheadedness with hypotension:Yes Has patient had a PCN reaction causing severe rash involving mucus membranes or skin necrosis: Unknown Has patient had a PCN reaction that required hospitalization: Unknown Has patient had a PCN reaction occurring within the last 10 years: No Childhood reaction. If all of the above answers are "NO", then may proceed with Cephalosporin use.    Sulfa Antibiotics Hives   Ancef [Cefazolin] Rash    Past Surgical History:  Procedure Laterality Date   BUNIONECTOMY Right 07/19/2021   Procedure: BUNIONECTOMY - LAPIDUS-TYPE;  Surgeon: Samara Deist, DPM;  Location: [place];  Service: Podiatry;  Laterality: Right;   CHOLECYSTECTOMY N/A 06/21/2017   Procedure: LAPAROSCOPIC CHOLECYSTECTOMY WITH INTRAOPERATIVE CHOLANGIOGRAM;  Surgeon: Robert Bellow, MD;  Location: [facility];  Service: General;  Laterality: N/A;   COLONOSCOPY  2012   cleared for 10 yrs- [place] Bilateral 2018   FOOT ARTHRODESIS Right 07/19/2021   Procedure: ARTHRODESIS FOOT; LISFRANC; MULTIPLE;  Surgeon: Samara Deist, DPM;  Location: [place];  Service: Podiatry;  Laterality: Right;  Diabetic   GASTRIC BYPASS  2013   Dr Alisia Ferrari at [facility] HERNIA REPAIR  06/21/2017   Procedure: HERNIA REPAIR  UMBILICAL ADULT;  Surgeon: Robert Bellow, MD;  Location: [facility];  Service: General;;   VAGINAL HYSTERECTOMY     ovaries remain    Social History   Tobacco Use   Smoking status: Former    Packs/day: 1.00    Years: 20.00    Pack years: 20.00    Types: Cigarettes    Quit date: 06/18/2010    Years since quitting: 11.1   Smokeless tobacco: Never  Vaping Use   Vaping Use: Never used  Substance Use Topics   Alcohol use: Not Currently    Alcohol/week: 0.0 standard drinks   Drug use: No     Medication list has been reviewed and updated.  Current Meds  Medication Sig   APPLE CIDER VINEGAR PO Take 2 tablets by mouth daily.   cholecalciferol (VITAMIN D) 1000 UNITS tablet Take 1,000 Units by mouth daily.   CINNAMON PO Take 8,000 mg by mouth daily.   citalopram (CELEXA) 20 MG tablet Take 1 tablet (20 mg total) by mouth daily.   fluticasone (FLONASE) 50 MCG/ACT nasal spray SPRAY 2 SPRAYS INTO EACH NOSTRIL EVERY DAY   glipiZIDE (GLUCOTROL XL) 2.5 MG 24 hr tablet TAKE 1 TABLET BY MOUTH DAILY WITH BREAKFAST. ADD TO METFORMIN. WILL RECHECK IN 8 WEEKS   Glucosamine-Chondroitin (COSAMIN DS PO) Take 2 tablets by mouth daily.   hydrochlorothiazide (HYDRODIURIL) 25 MG tablet Take 1  tablet (25 mg total) by mouth daily.   levothyroxine (SYNTHROID) 50 MCG tablet Take 1 tablet (50 mcg total) by mouth daily.   levothyroxine (SYNTHROID) 75 MCG tablet TAKE 1 TABLET BY MOUTH EVERY T, TH, Sat, and Sunday   metFORMIN (GLUCOPHAGE-XR) 500 MG 24 hr tablet Take 1 tablet by mouth daily   montelukast (SINGULAIR) 10 MG tablet TAKE 1 TABLET BY MOUTH EVERYDAY AT BEDTIME   Omega-3 Fatty Acids (FISH OIL) 1000 MG CAPS Take 1 capsule (1,000 mg total) by mouth daily.   Polyethyl Glycol-Propyl Glycol 0.4-0.3 % SOLN Place 1 drop into both eyes 3 (three) times daily as needed (for dry eyes.).   simvastatin (ZOCOR) 20 MG tablet Take 1 tablet (20 mg total) by mouth daily.   [DISCONTINUED] oxyCODONE-acetaminophen  (PERCOCET) 5-325 MG tablet Take 1-2 tablets by mouth every 6 (six) hours as needed for severe pain. Max 6 tabs per day   [DISCONTINUED] pantoprazole (PROTONIX) 40 MG tablet Take 40 mg by mouth daily.   [DISCONTINUED] vitamin B-12 (CYANOCOBALAMIN) 500 MCG tablet Take 500 mcg by mouth daily.       08/15/2021    8:17 AM 11/03/2020    8:14 AM 04/14/2020    8:38 AM 03/07/2020   11:24 AM  GAD 7 : Generalized Anxiety Score  Nervous, Anxious, on Edge 0 0 0 0  Control/stop worrying 0 1 0 0  Worry too much - different things 0 0 0 0  Trouble relaxing 0 0 0 0  Restless 0 0 0 0  Easily annoyed or irritable 0 0 0 0  Afraid - awful might happen 0 0 0 0  Total GAD 7 Score 0 1 0 0  Anxiety Difficulty Not difficult at all Not difficult at all  Not difficult at all       08/15/2021    8:17 AM  Depression screen PHQ 2/9  Decreased Interest 0  Down, Depressed, Hopeless 0  PHQ - 2 Score 0  Altered sleeping 1  Tired, decreased energy 3  Change in appetite 1  Feeling bad or failure about yourself  0  Trouble concentrating 0  Moving slowly or fidgety/restless 0  Suicidal thoughts 0  PHQ-9 Score 5  Difficult doing work/chores Not difficult at all    BP Readings from Last 3 Encounters:  08/15/21 128/72  07/19/21 117/70  04/06/21 130/62    Physical Exam Vitals and nursing note reviewed.  Constitutional:      General: She is not in acute distress.    Appearance: She is not diaphoretic.  HENT:     Head: Normocephalic and atraumatic.     Right Ear: Tympanic membrane, ear canal and external ear normal.     Left Ear: Tympanic membrane, ear canal and external ear normal.     Nose: Nose normal.  Eyes:     General:        Right eye: No discharge.        Left eye: No discharge.     Conjunctiva/sclera: Conjunctivae normal.     Pupils: Pupils are equal, round, and reactive to light.  Neck:     Thyroid: No thyroid mass, thyromegaly or thyroid tenderness.     Vascular: No JVD.  Cardiovascular:      Rate and Rhythm: Normal rate and regular rhythm.     Heart sounds: Normal heart sounds. No murmur heard.   No friction rub. No gallop.  Pulmonary:     Effort: Pulmonary effort is normal.  Breath sounds: Normal breath sounds. No decreased breath sounds, wheezing, rhonchi or rales.  Abdominal:     General: Bowel sounds are normal.     Palpations: Abdomen is soft. There is no hepatomegaly, splenomegaly or mass.     Tenderness: There is no abdominal tenderness. There is no guarding.  Musculoskeletal:        General: Normal range of motion.     Cervical back: Normal range of motion and neck supple.  Lymphadenopathy:     Cervical: No cervical adenopathy.  Skin:    General: Skin is warm and dry.  Neurological:     Mental Status: She is alert.    Wt Readings from Last 3 Encounters:  08/15/21 171 lb (77.6 kg)  07/19/21 177 lb (80.3 kg)  04/06/21 171 lb (77.6 kg)    BP 128/72   Pulse 82   Ht 5' 3"  (1.6 m)   Wt 171 lb (77.6 kg)   BMI 30.29 kg/m   Assessment and Plan:  1. Chronic maxillary sinusitis Chronic.  Episodic.  Patient has purulence that she is draining from an area either from a cyst or perhaps is an abscess sinus for which she has been taking amoxicillin that belonged to her husband.  On evaluation patient was noted to have allergy/anaphylaxis to penicillin and I have suggested that we cannot continue this and since patient cannot take sulfa we will use doxycycline 100 mg twice a day.  Patient is awaiting an ENT consult however I feel that the oral surgery consult that was suggested by her dentist may be more appropriate if this is an abscess that is filling up and draining and filling up and draining and it needs to be I indeed and removed.  I have asked patient to reconsider seeing an oral surgeon. - doxycycline (VIBRA-TABS) 100 MG tablet; Take 1 tablet (100 mg total) by mouth 2 (two) times daily.  Dispense: 20 tablet; Refill: 0  2. Uncontrolled type 2 diabetes mellitus  with hyperglycemia (HCC) Chronic.  Episodic.  Stable.  Currently is on glipizide XL 2.5 and metformin XR 500 mg once a day.  Will check A1c for control. - glipiZIDE (GLUCOTROL XL) 2.5 MG 24 hr tablet; TAKE 1 TABLET BY MOUTH DAILY WITH BREAKFAST.  Dispense: 90 tablet; Refill: 0 - metFORMIN (GLUCOPHAGE-XR) 500 MG 24 hr tablet; Take 1 tablet by mouth daily  Dispense: 90 tablet; Refill: 0 - HgB A1c  3. Hypothyroidism, unspecified type Chronic.  Uncontrolled.  Patient has fatigue but there is multiple reasons including the abscess recent surgery on foot thyroid and diabetes that was newly uncontrolled.  We will check TSH and thyroid to see level of control and adjust accordingly whether or not we go to 75 or 50 mcg - Thyroid Panel With TSH

## 2021-08-16 LAB — THYROID PANEL WITH TSH
Free Thyroxine Index: 2.1 (ref 1.2–4.9)
T3 Uptake Ratio: 24 % (ref 24–39)
T4, Total: 8.6 ug/dL (ref 4.5–12.0)
TSH: 1.41 u[IU]/mL (ref 0.450–4.500)

## 2021-08-16 LAB — HEMOGLOBIN A1C
Est. average glucose Bld gHb Est-mCnc: 143 mg/dL
Hgb A1c MFr Bld: 6.6 % — ABNORMAL HIGH (ref 4.8–5.6)

## 2021-08-23 ENCOUNTER — Ambulatory Visit: Payer: Self-pay

## 2021-08-23 ENCOUNTER — Encounter: Payer: Self-pay | Admitting: Family Medicine

## 2021-08-23 ENCOUNTER — Ambulatory Visit (INDEPENDENT_AMBULATORY_CARE_PROVIDER_SITE_OTHER): Payer: BC Managed Care – PPO | Admitting: Family Medicine

## 2021-08-23 VITALS — BP 138/80 | HR 80 | Ht 63.0 in | Wt 171.0 lb

## 2021-08-23 DIAGNOSIS — L509 Urticaria, unspecified: Secondary | ICD-10-CM | POA: Diagnosis not present

## 2021-08-23 MED ORDER — PREDNISONE 10 MG PO TABS: ORAL_TABLET | ORAL | 0 refills | Status: DC

## 2021-08-23 MED ORDER — TRIAMCINOLONE ACETONIDE 0.1 % EX CREA: 1.0000 "application " | TOPICAL_CREAM | Freq: Two times a day (BID) | CUTANEOUS | 0 refills | Status: DC

## 2021-08-23 MED ORDER — EPINEPHRINE 0.3 MG/0.3ML IJ SOAJ: 0.3000 mg | INTRAMUSCULAR | 1 refills | Status: DC | PRN

## 2021-08-23 NOTE — Progress Notes (Signed)
Date:  08/23/2021   Name:  Phyllis Wagner   DOB:  September 03, 1955   MRN:  [id number]   Chief Complaint: Rash (Off and on x 3 weeks. Seeing whelps/ splotches on chest and arm. )  Rash This is a recurrent problem. The current episode started more than 1 month ago. The problem has been waxing and waning since onset. The rash is diffuse. The rash is characterized by redness and itchiness. Pertinent negatives include no anorexia, congestion, fever, shortness of breath or sore throat. Past treatments include antibiotics, antihistamine and topical steroids. The treatment provided mild relief.    Lab Results  Component Value Date   NA 138 04/06/2021   K 4.5 04/06/2021   CO2 27 04/06/2021   GLUCOSE 111 (H) 04/06/2021   BUN 13 04/06/2021   CREATININE 0.70 04/06/2021   CALCIUM 9.9 04/06/2021   EGFR 97 04/06/2021   GFRNONAA 96 04/14/2020   Lab Results  Component Value Date   CHOL 180 04/06/2021   HDL 77 04/06/2021   LDLCALC 89 04/06/2021   TRIG 78 04/06/2021   CHOLHDL 2.5 02/04/2018   Lab Results  Component Value Date   TSH 1.410 08/15/2021   Lab Results  Component Value Date   HGBA1C 6.6 (H) 08/15/2021   Lab Results  Component Value Date   WBC 5.0 04/14/2020   HGB 11.3 04/14/2020   HCT 35.3 04/14/2020   MCV 86 04/14/2020   PLT 296 04/14/2020   Lab Results  Component Value Date   ALT 12 04/06/2021   AST 22 04/06/2021   ALKPHOS 78 04/06/2021   BILITOT 0.3 04/06/2021   No results found for: "25OHVITD2", "25OHVITD3", "VD25OH"   Review of Systems  Constitutional:  Negative for fever.  HENT:  Negative for congestion and sore throat.   Respiratory:  Negative for shortness of breath.   Gastrointestinal:  Negative for anorexia.  Skin:  Positive for rash.    Patient Active Problem List   Diagnosis Date Noted   Calculus of gallbladder without cholecystitis without obstruction 06/12/2017   Chronic maxillary sinusitis 03/15/2015   Chronic rhinitis 03/15/2015    Mild intermittent asthma without complication 79/89/2119   Oroantral fistula 03/15/2015   Essential hypertension 01/05/2015   Thyroid activity decreased 01/05/2015   Hyperlipidemia 01/05/2015   Recurrent major depressive disorder, in partial remission ([place]) 01/05/2015    Allergies  Allergen Reactions   Aspirin Anaphylaxis, Hives and Shortness Of Breath   Biaxin [Clarithromycin] Anaphylaxis, Hives and Shortness Of Breath   Nsaids Hives and Swelling   Penicillins Anaphylaxis, Hives and Swelling    Has patient had a PCN reaction causing immediate rash, facial/tongue/throat swelling, SOB or lightheadedness with hypotension:Yes Has patient had a PCN reaction causing severe rash involving mucus membranes or skin necrosis: Unknown Has patient had a PCN reaction that required hospitalization: Unknown Has patient had a PCN reaction occurring within the last 10 years: No Childhood reaction. If all of the above answers are "NO", then may proceed with Cephalosporin use.    Sulfa Antibiotics Hives   Ancef [Cefazolin] Rash    Past Surgical History:  Procedure Laterality Date   BUNIONECTOMY Right 07/19/2021   Procedure: BUNIONECTOMY - LAPIDUS-TYPE;  Surgeon: Samara Deist, DPM;  Location: [facility];  Service: Podiatry;  Laterality: Right;   CHOLECYSTECTOMY N/A 06/21/2017   Procedure: LAPAROSCOPIC CHOLECYSTECTOMY WITH INTRAOPERATIVE CHOLANGIOGRAM;  Surgeon: Robert Bellow, MD;  Location: [facility];  Service: General;  Laterality: N/A;   COLONOSCOPY  2012  cleared for 10 yrs- [facility] Bilateral 2018   FOOT ARTHRODESIS Right 07/19/2021   Procedure: ARTHRODESIS FOOT; LISFRANC; MULTIPLE;  Surgeon: Samara Deist, DPM;  Location: [facility];  Service: Podiatry;  Laterality: Right;  Diabetic   GASTRIC BYPASS  2013   Dr Alisia Ferrari at [place] HERNIA REPAIR  06/21/2017   Procedure: HERNIA  REPAIR UMBILICAL ADULT;  Surgeon: Robert Bellow, MD;  Location: [facility];  Service: General;;   VAGINAL HYSTERECTOMY     ovaries remain    Social History   Tobacco Use   Smoking status: Former    Packs/day: 1.00    Years: 20.00    Total pack years: 20.00    Types: Cigarettes    Quit date: 06/18/2010    Years since quitting: 11.1   Smokeless tobacco: Never  Vaping Use   Vaping Use: Never used  Substance Use Topics   Alcohol use: Not Currently    Alcohol/week: 0.0 standard drinks of alcohol   Drug use: No     Medication list has been reviewed and updated.  No outpatient medications have been marked as taking for the 08/23/21 encounter (Office Visit) with Juline Patch, MD.       08/15/2021    8:17 AM 11/03/2020    8:14 AM 04/14/2020    8:38 AM 03/07/2020   11:24 AM  GAD 7 : Generalized Anxiety Score  Nervous, Anxious, on Edge 0 0 0 0  Control/stop worrying 0 1 0 0  Worry too much - different things 0 0 0 0  Trouble relaxing 0 0 0 0  Restless 0 0 0 0  Easily annoyed or irritable 0 0 0 0  Afraid - awful might happen 0 0 0 0  Total GAD 7 Score 0 1 0 0  Anxiety Difficulty Not difficult at all Not difficult at all  Not difficult at all       08/15/2021    8:17 AM  Depression screen PHQ 2/9  Decreased Interest 0  Down, Depressed, Hopeless 0  PHQ - 2 Score 0  Altered sleeping 1  Tired, decreased energy 3  Change in appetite 1  Feeling bad or failure about yourself  0  Trouble concentrating 0  Moving slowly or fidgety/restless 0  Suicidal thoughts 0  PHQ-9 Score 5  Difficult doing work/chores Not difficult at all    BP Readings from Last 3 Encounters:  08/23/21 138/80  08/15/21 128/72  07/19/21 117/70    Physical Exam Vitals and nursing note reviewed. Exam conducted with a chaperone present.  Constitutional:      General: She is not in acute distress.    Appearance: She is not diaphoretic.  HENT:     Head: Normocephalic and atraumatic.     Right Ear:  External ear normal.     Left Ear: External ear normal.     Nose: Nose normal. No congestion or rhinorrhea.     Mouth/Throat:     Mouth: Mucous membranes are moist.  Eyes:     General:        Right eye: No discharge.        Left eye: No discharge.     Conjunctiva/sclera: Conjunctivae normal.     Pupils: Pupils are equal, round, and reactive to light.  Neck:     Thyroid: No thyromegaly.  Vascular: No JVD.  Cardiovascular:     Rate and Rhythm: Normal rate and regular rhythm.     Heart sounds: Normal heart sounds. No murmur heard.    No friction rub. No gallop.  Pulmonary:     Effort: Pulmonary effort is normal.     Breath sounds: Normal breath sounds. No wheezing, rhonchi or rales.  Abdominal:     General: Bowel sounds are normal.     Palpations: Abdomen is soft. There is no mass.     Tenderness: There is no abdominal tenderness. There is no guarding.  Musculoskeletal:        General: Normal range of motion.     Cervical back: Normal range of motion and neck supple.  Lymphadenopathy:     Cervical: No cervical adenopathy.  Skin:    General: Skin is warm and dry.     Findings: Erythema and rash present. Rash is macular.  Neurological:     Mental Status: She is alert.     Deep Tendon Reflexes: Reflexes are normal and symmetric.     Wt Readings from Last 3 Encounters:  08/23/21 171 lb (77.6 kg)  08/15/21 171 lb (77.6 kg)  07/19/21 177 lb (80.3 kg)    BP 138/80   Pulse 80   Ht _0  (1.6 m)   Wt 171 lb (77.6 kg)   BMI 30.29 kg/m   Assessment and Plan:  1. Urticaria Chronic.  Recurrent.  Intermittent in appearance over the past several weeks.  In 2016 we treated with topical steroid which we will give triamcinolone 0.1% and prednisone 10 mg taper from 60 mg over 2-week.  We discussed other things that may be causing this.  Concerned about and states that she is allergic to Anaprox and aspirin and that she is to avoid anything of this nature and may only take Tylenol  for pain.  For right now we will discontinue hydrochlorothiazide until we get this under control and we will cautiously add this back if this is necessary for blood pressure control.  We will resume montelukast for leukotriene reduction and encouraged taking of Zyrtec in the morning with Benadryl in the evening.  We have placed a dermatology consult because of the recurrent nature of this and to be discussed further evaluation whether this be of a ear nose and throat versus dermatology.  We have refilled her EpiPen's and a precautionary concern - Ambulatory referral to Dermatology - triamcinolone cream (KENALOG) 0.1 %; Apply 1 application  topically 2 (two) times daily.  Dispense: 30 g; Refill: 0 - predniSONE (DELTASONE) 10 MG tablet; Taper 6,6,6,5,5,5,4,4,3,3,2,2,1,1  Dispense: 53 tablet; Refill: 0 - EPINEPHrine 0.3 mg/0.3 mL IJ SOAJ injection; Inject 0.3 mg into the muscle as needed for anaphylaxis.  Dispense: 1 each; Refill: 1

## 2021-08-23 NOTE — Telephone Encounter (Signed)
Pt stated that office called and she was given an appt today at 1140. Triage ended.

## 2021-08-23 NOTE — Telephone Encounter (Signed)
Summary: hives on chest and arm   Pt called in has hives on arms and chest, she doesn't want it to progress into shock, she is  asking for med or z pak [facility]. She wanted appt today, but none available until tomorrow. [id number] Dan Humphreys,  - [address]  Phone: [phone]  Fax: [phone]      Started call and pt stated another call was coming in from [facility] and she asked NT to hold. After a few minutes, pt disconnected call.  Answer Assessment - Initial Assessment Questions 1. APPEARANCE: "What does the rash look like?"      Spotty hives 2. LOCATION: "Where is the rash located?"      Arms and chest  3. NUMBER: "How many hives are there?"      *No Answer* 4. SIZE: "How big are the hives?" (inches, cm, compare to coins) "Do they all look the same or is there lots of variation in shape and size?"      *No Answer* 5. ONSET: "When did the hives begin?" (Hours or days ago)      Last week skin itchy,  6. ITCHING: "Does it itch?" If Yes, ask: "How bad is the itch?"    - MILD: doesn't interfere with normal activities   - MODERATE-SEVERE: interferes with work, school, sleep, or other activities      mild 7. RECURRENT PROBLEM: "Have you had hives before?" If Yes, ask: "When was the last time?" and "What happened that time?"      yes 8. TRIGGERS: "Were you exposed to any new food, plant, cosmetic product or animal just before the hives began?"     no 9. OTHER SYMPTOMS: "Do you have any other symptoms?" (e.g., fever, tongue swelling, difficulty breathing, abdominal pain)     no  Protocols used: Hives-A-AH

## 2021-08-30 DIAGNOSIS — M19071 Primary osteoarthritis, right ankle and foot: Secondary | ICD-10-CM | POA: Diagnosis not present

## 2021-08-30 DIAGNOSIS — M2011 Hallux valgus (acquired), right foot: Secondary | ICD-10-CM | POA: Diagnosis not present

## 2021-09-09 DIAGNOSIS — E119 Type 2 diabetes mellitus without complications: Secondary | ICD-10-CM | POA: Diagnosis not present

## 2021-09-15 DIAGNOSIS — D385 Neoplasm of uncertain behavior of other respiratory organs: Secondary | ICD-10-CM | POA: Diagnosis not present

## 2021-09-15 DIAGNOSIS — J320 Chronic maxillary sinusitis: Secondary | ICD-10-CM | POA: Diagnosis not present

## 2021-09-19 DIAGNOSIS — J320 Chronic maxillary sinusitis: Secondary | ICD-10-CM | POA: Diagnosis not present

## 2021-09-25 ENCOUNTER — Other Ambulatory Visit: Payer: Self-pay | Admitting: Family Medicine

## 2021-09-25 DIAGNOSIS — E039 Hypothyroidism, unspecified: Secondary | ICD-10-CM

## 2021-09-27 ENCOUNTER — Other Ambulatory Visit: Payer: Self-pay | Admitting: Family Medicine

## 2021-09-27 DIAGNOSIS — J301 Allergic rhinitis due to pollen: Secondary | ICD-10-CM

## 2021-09-28 NOTE — Telephone Encounter (Signed)
Requested Prescriptions  Pending Prescriptions Disp Refills  . fluticasone (FLONASE) 50 MCG/ACT nasal spray [Pharmacy Med Name: FLUTICASONE PROP 50 MCG SPRAY] 16 mL 2    Sig: SPRAY 2 SPRAYS INTO EACH NOSTRIL EVERY DAY     Ear, Nose, and Throat: Nasal Preparations - Corticosteroids Passed - 09/27/2021  4:05 PM      Passed - Valid encounter within last 12 months    Recent Outpatient Visits          1 month ago Urticaria   [facility] Duanne Limerick, MD   1 month ago Chronic maxillary sinusitis   [facility] Duanne Limerick, MD   5 months ago Essential hypertension   [facility] Duanne Limerick, MD   10 months ago Essential hypertension   [facility] Duanne Limerick, MD   1 year ago Essential hypertension   [facility] Duanne Limerick, MD      Future Appointments            In 3 months Duanne Limerick, MD [facility], [facility]

## 2021-10-09 DIAGNOSIS — M2012 Hallux valgus (acquired), left foot: Secondary | ICD-10-CM | POA: Diagnosis not present

## 2021-10-09 DIAGNOSIS — G5781 Other specified mononeuropathies of right lower limb: Secondary | ICD-10-CM | POA: Diagnosis not present

## 2021-10-09 DIAGNOSIS — M2011 Hallux valgus (acquired), right foot: Secondary | ICD-10-CM | POA: Diagnosis not present

## 2021-10-10 DIAGNOSIS — E119 Type 2 diabetes mellitus without complications: Secondary | ICD-10-CM | POA: Diagnosis not present

## 2021-10-18 ENCOUNTER — Other Ambulatory Visit: Payer: Self-pay | Admitting: Family Medicine

## 2021-10-18 DIAGNOSIS — I10 Essential (primary) hypertension: Secondary | ICD-10-CM

## 2021-10-29 ENCOUNTER — Other Ambulatory Visit: Payer: Self-pay | Admitting: Family Medicine

## 2021-10-29 DIAGNOSIS — J4520 Mild intermittent asthma, uncomplicated: Secondary | ICD-10-CM

## 2021-11-08 DIAGNOSIS — M1711 Unilateral primary osteoarthritis, right knee: Secondary | ICD-10-CM | POA: Diagnosis not present

## 2021-11-10 DIAGNOSIS — E119 Type 2 diabetes mellitus without complications: Secondary | ICD-10-CM | POA: Diagnosis not present

## 2021-11-15 ENCOUNTER — Other Ambulatory Visit: Payer: Self-pay | Admitting: Family Medicine

## 2021-11-15 DIAGNOSIS — F3341 Major depressive disorder, recurrent, in partial remission: Secondary | ICD-10-CM

## 2021-11-15 DIAGNOSIS — E1165 Type 2 diabetes mellitus with hyperglycemia: Secondary | ICD-10-CM

## 2021-11-21 IMAGING — CR DG CHEST 2V
2 series · 2 of 2 positions shown · non-contrast
Comparison: 06/27/2011

CLINICAL DATA: Possible COVID congestion

EXAM:
CHEST - 2 VIEW

[chest pa]
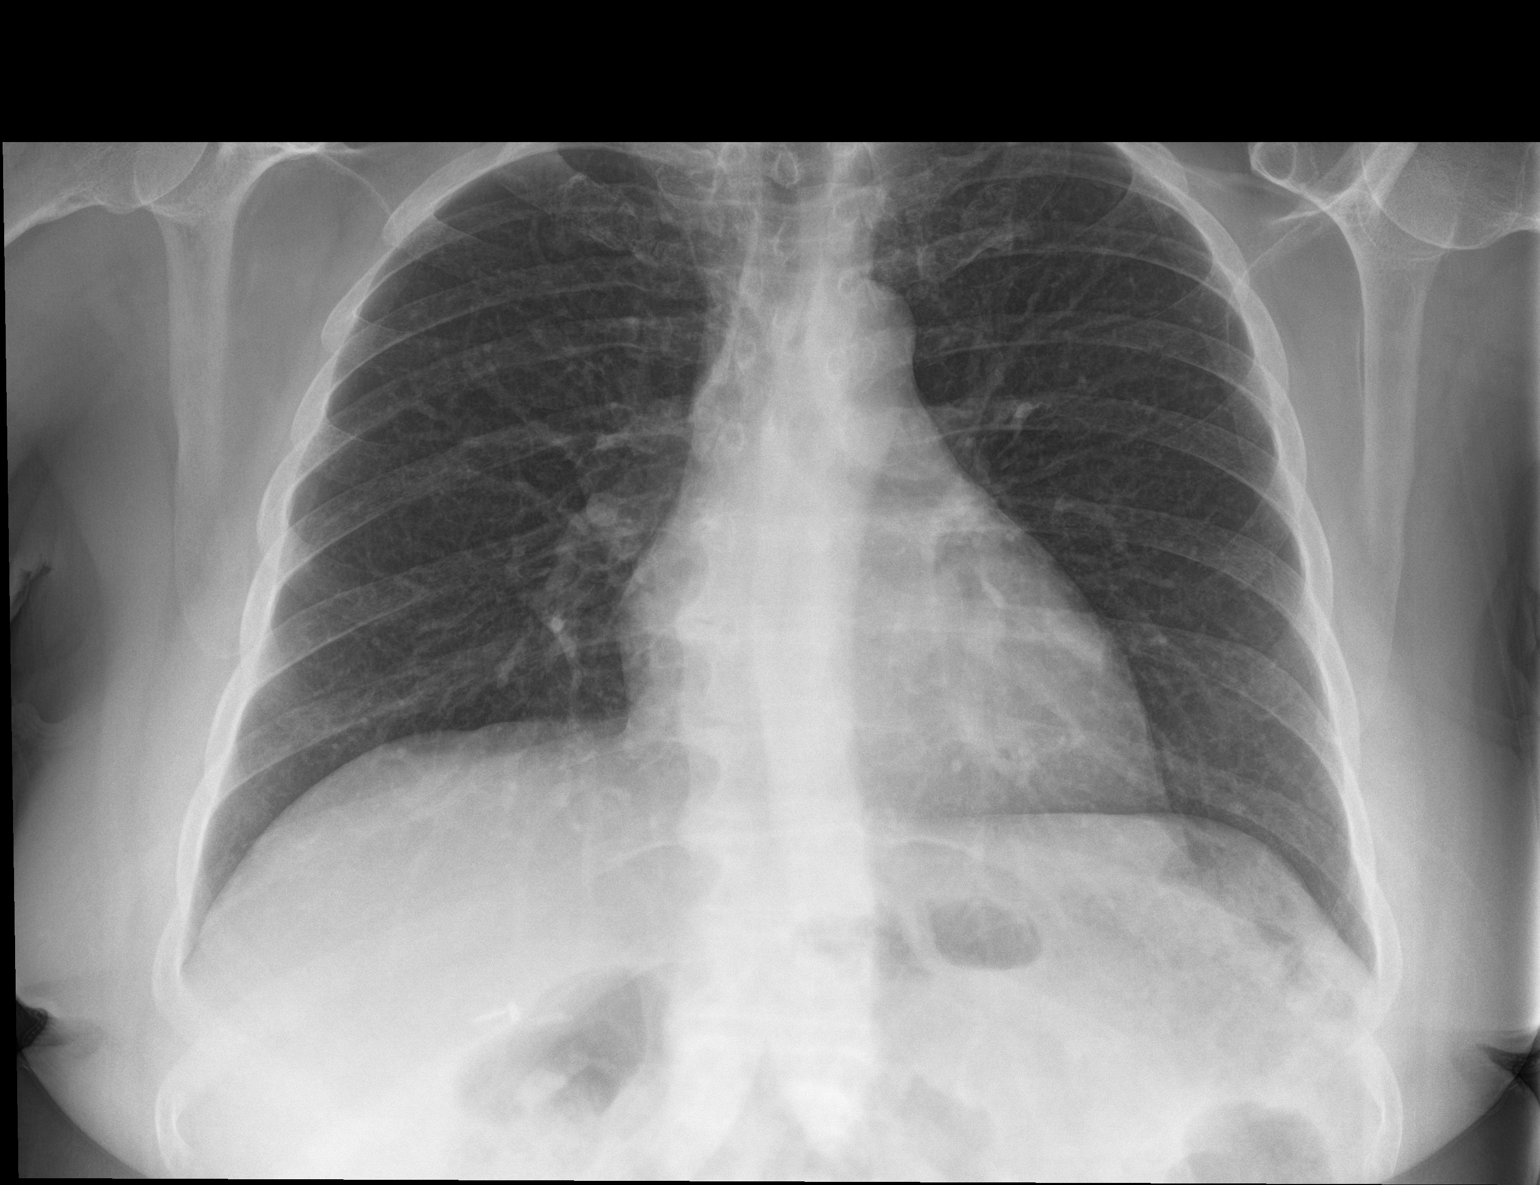

[chest lat]
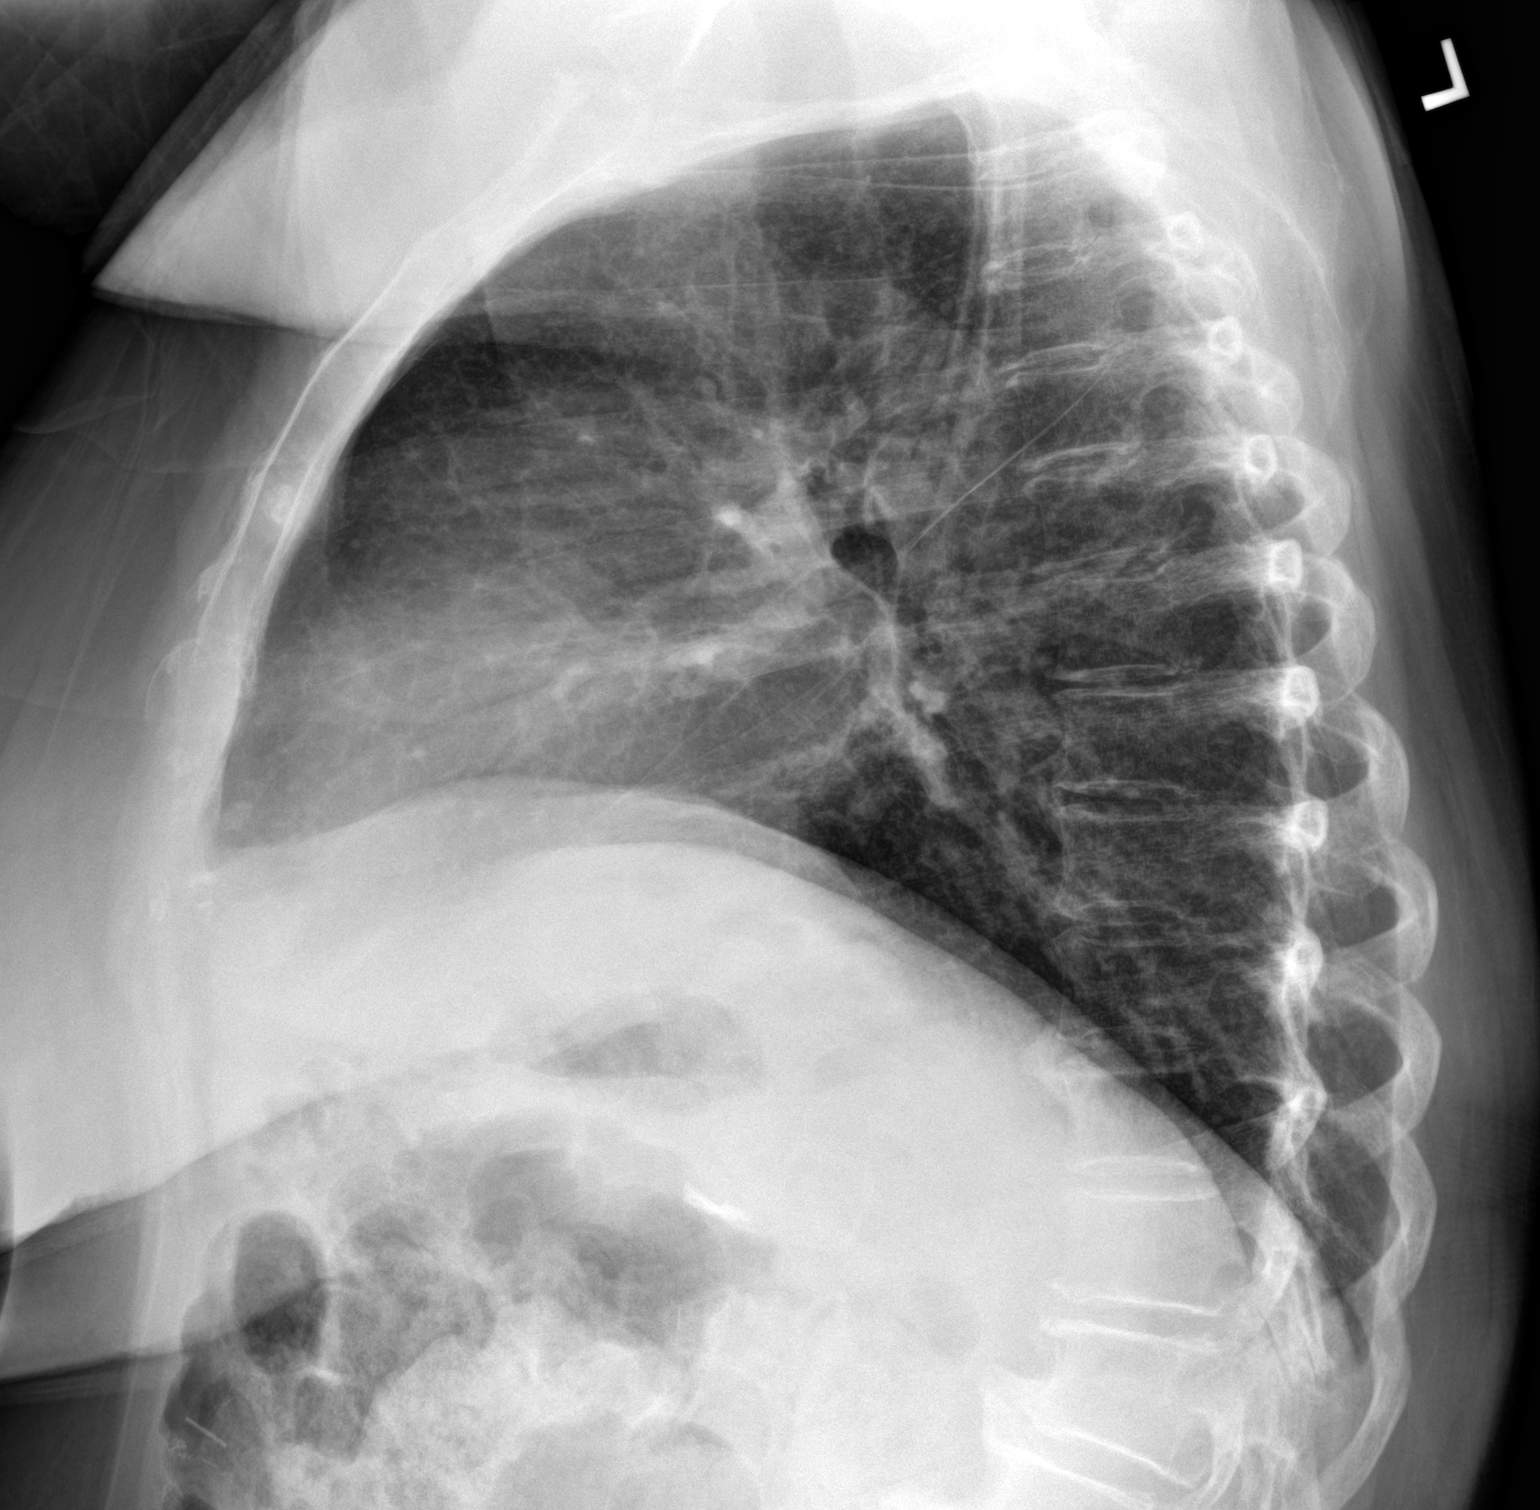

[2 of 2 positions shown; findings below may reference images not displayed]

FINDINGS: The heart size and mediastinal contours are within normal limits.
Both lungs are clear. Mild degenerative changes and scoliosis of the
spine.
IMPRESSION: No active cardiopulmonary disease.

## 2021-12-04 DIAGNOSIS — Z0100 Encounter for examination of eyes and vision without abnormal findings: Secondary | ICD-10-CM | POA: Diagnosis not present

## 2021-12-04 DIAGNOSIS — E119 Type 2 diabetes mellitus without complications: Secondary | ICD-10-CM | POA: Diagnosis not present

## 2021-12-04 LAB — HM DIABETES EYE EXAM

## 2021-12-08 ENCOUNTER — Other Ambulatory Visit: Payer: Self-pay | Admitting: Family Medicine

## 2021-12-08 DIAGNOSIS — F3341 Major depressive disorder, recurrent, in partial remission: Secondary | ICD-10-CM

## 2021-12-08 DIAGNOSIS — E1165 Type 2 diabetes mellitus with hyperglycemia: Secondary | ICD-10-CM

## 2021-12-08 NOTE — Telephone Encounter (Signed)
Requested Prescriptions  Pending Prescriptions Disp Refills  . metFORMIN (GLUCOPHAGE-XR) 500 MG 24 hr tablet [Pharmacy Med Name: METFORMIN HCL ER 500 MG TABLET] 30 tablet 0    Sig: TAKE 1 TABLET BY MOUTH EVERY DAY     Endocrinology:  Diabetes - Biguanides Failed - 12/08/2021 11:31 AM      Failed - B12 Level in normal range and within 720 days    No results found for: "VITAMINB12"       Failed - CBC within normal limits and completed in the last 12 months    WBC  Date Value Ref Range Status  04/14/2020 5.0 3.4 - 10.8 x10E3/uL Final  06/19/2017 5.3 3.6 - 11.0 K/uL Final   RBC  Date Value Ref Range Status  04/14/2020 4.12 3.77 - 5.28 x10E6/uL Final  06/19/2017 3.90 3.80 - 5.20 MIL/uL Final   Hemoglobin  Date Value Ref Range Status  04/14/2020 11.3 11.1 - 15.9 g/dL Final   Hematocrit  Date Value Ref Range Status  04/14/2020 35.3 34.0 - 46.6 % Final   MCHC  Date Value Ref Range Status  04/14/2020 32.0 31.5 - 35.7 g/dL Final  06/19/2017 35.0 32.0 - 36.0 g/dL Final   [facility]  Date Value Ref Range Status  04/14/2020 27.4 26.6 - 33.0 pg Final  06/19/2017 31.7 26.0 - 34.0 pg Final   MCV  Date Value Ref Range Status  04/14/2020 86 79 - 97 fL Final   No results found for: "PLTCOUNTKUC", "LABPLAT", "POCPLA" RDW  Date Value Ref Range Status  04/14/2020 14.3 11.7 - 15.4 % Final         Passed - Cr in normal range and within 360 days    Creatinine, Ser  Date Value Ref Range Status  04/06/2021 0.70 0.57 - 1.00 mg/dL Final         Passed - HBA1C is between 0 and 7.9 and within 180 days    Hgb A1c MFr Bld  Date Value Ref Range Status  08/15/2021 6.6 (H) 4.8 - 5.6 % Final    Comment:             Prediabetes: 5.7 - 6.4          Diabetes: >6.4          Glycemic control for adults with diabetes: <7.0          Passed - eGFR in normal range and within 360 days    GFR calc Af Amer  Date Value Ref Range Status  04/14/2020 111 >59 mL/min/1.73 Final    Comment:    **In  accordance with recommendations from the NKF-ASN Task force,**   [facility] is in the process of updating its eGFR calculation to the   2021 CKD-EPI creatinine equation that estimates kidney function   without a race variable.    GFR calc non Af Amer  Date Value Ref Range Status  04/14/2020 96 >59 mL/min/1.73 Final   eGFR  Date Value Ref Range Status  04/06/2021 97 >59 mL/min/1.73 Final         Passed - Valid encounter within last 6 months    Recent Outpatient Visits          3 months ago Urticaria   [facility] [place], Deanna C, MD   3 months ago Chronic maxillary sinusitis   [place] [facility] [place], Deanna C, MD   8 months ago Essential hypertension  [facility] at [place], Deanna C, MD   1 year ago Essential hypertension   [place] [facility] [place], Deanna C, MD   1 year ago Essential hypertension   [place] [facility] [place], [place], MD      Future Appointments            In 3 weeks Juline Patch, MD [facility] at [facility], [facility]

## 2021-12-08 NOTE — Telephone Encounter (Signed)
Requested medication (s) are due for refill today: yes  Requested medication (s) are on the active medication list: yes  Last refill:  11/15/21 #30 0 refills  Future visit scheduled: yes in 3 weeks  Notes to clinic:  Pharmacy comment: [place]. DX Code Needed     Requested Prescriptions  Pending Prescriptions Disp Refills   citalopram (CELEXA) 20 MG tablet [Pharmacy Med Name: CITALOPRAM HBR 20 MG TABLET] 90 tablet 1    Sig: TAKE 1 TABLET BY MOUTH EVERY DAY     Psychiatry:  Antidepressants - SSRI Passed - 12/08/2021 11:30 AM      Passed - Completed PHQ-2 or PHQ-9 in the last 360 days      Passed - Valid encounter within last 6 months    Recent Outpatient Visits           3 months ago Urticaria   [place] [facility] [place], [place], MD   3 months ago Chronic maxillary sinusitis   [place] [facility] [place], Deanna C, MD   8 months ago Essential hypertension   [place] [facility] [place], Deanna C, MD   1 year ago Essential hypertension   [place] [facility] [place], Deanna C, MD   1 year ago Essential hypertension    [facility] Medicine at [place], [place], MD       Future Appointments             In 3 weeks Juline Patch, MD [place] [facility] at [facility], [facility]

## 2021-12-17 ENCOUNTER — Other Ambulatory Visit: Payer: Self-pay | Admitting: Family Medicine

## 2021-12-17 DIAGNOSIS — E782 Mixed hyperlipidemia: Secondary | ICD-10-CM

## 2021-12-18 NOTE — Telephone Encounter (Signed)
Requested Prescriptions  Pending Prescriptions Disp Refills  . simvastatin (ZOCOR) 20 MG tablet [Pharmacy Med Name: SIMVASTATIN 20 MG TABLET] 90 tablet 0    Sig: TAKE 1 TABLET BY MOUTH EVERY DAY     Cardiovascular:  Antilipid - Statins Failed - 12/17/2021  1:13 AM      Failed - Lipid Panel in normal range within the last 12 months    Cholesterol, Total  Date Value Ref Range Status  04/06/2021 180 100 - 199 mg/dL Final   LDL Chol Calc (NIH)  Date Value Ref Range Status  04/06/2021 89 0 - 99 mg/dL Final   HDL  Date Value Ref Range Status  04/06/2021 77 >39 mg/dL Final   Triglycerides  Date Value Ref Range Status  04/06/2021 78 0 - 149 mg/dL Final         Passed - Patient is not pregnant      Passed - Valid encounter within last 12 months    Recent Outpatient Visits          3 months ago Urticaria   [place] [facility] [place], [place], MD   4 months ago Chronic maxillary sinusitis   [place] [facility] [place], Deanna C, MD   8 months ago Essential hypertension   [place] [facility] at [place], Deanna C, MD   1 year ago Essential hypertension   [place] [facility] at [place], Deanna C, MD   1 year ago Essential hypertension   [place] [facility] [place], [place], MD      Future Appointments            In 1 week Juline Patch, MD [place] [facility] at [facility], [facility]

## 2021-12-25 DIAGNOSIS — M2011 Hallux valgus (acquired), right foot: Secondary | ICD-10-CM | POA: Diagnosis not present

## 2021-12-25 DIAGNOSIS — M2012 Hallux valgus (acquired), left foot: Secondary | ICD-10-CM | POA: Diagnosis not present

## 2021-12-25 DIAGNOSIS — G5791 Unspecified mononeuropathy of right lower limb: Secondary | ICD-10-CM | POA: Diagnosis not present

## 2021-12-29 ENCOUNTER — Other Ambulatory Visit: Payer: Self-pay | Admitting: Family Medicine

## 2021-12-29 ENCOUNTER — Ambulatory Visit: Payer: BC Managed Care – PPO | Admitting: Family Medicine

## 2021-12-29 DIAGNOSIS — J301 Allergic rhinitis due to pollen: Secondary | ICD-10-CM

## 2021-12-29 DIAGNOSIS — E039 Hypothyroidism, unspecified: Secondary | ICD-10-CM

## 2021-12-29 NOTE — Telephone Encounter (Signed)
Requested Prescriptions  Pending Prescriptions Disp Refills  . levothyroxine (SYNTHROID) 50 MCG tablet [Pharmacy Med Name: LEVOTHYROXINE 50 MCG TABLET] 36 tablet 0    Sig: TAKE 1 TABLET BY MOUTH ON MONDAY, [place]     Endocrinology:  Hypothyroid Agents Passed - 12/29/2021  1:27 AM      Passed - TSH in normal range and within 360 days    TSH  Date Value Ref Range Status  08/15/2021 1.410 0.450 - 4.500 uIU/mL Final         Passed - Valid encounter within last 12 months    Recent Outpatient Visits          4 months ago Urticaria   [place] [facility] [place], Deanna C, MD   4 months ago Chronic maxillary sinusitis   [place] [facility] [place], Deanna C, MD   8 months ago Essential hypertension   [place] [facility] at [place], Deanna C, MD   1 year ago Essential hypertension   [place] [facility] at [place], Deanna C, MD   1 year ago Essential hypertension   [place] [facility] [place], [place], MD      Future Appointments            In 4 days Juline Patch, MD [facility] at [facility], [facility]           . fluticasone (FLONASE) 50 MCG/ACT nasal spray [Pharmacy Med Name: FLUTICASONE PROP 50 MCG SPRAY] 48 mL 0    Sig: SPRAY 2 SPRAYS INTO EACH NOSTRIL EVERY DAY     Ear, Nose, and Throat: Nasal Preparations - Corticosteroids Passed - 12/29/2021  1:27 AM      Passed - Valid encounter within last 12 months    Recent Outpatient Visits          4 months ago Urticaria   [place] [facility] [place], Deanna C, MD   4 months ago Chronic maxillary sinusitis   [place] [facility] at [place], Deanna C, MD   8 months ago Essential hypertension    [place] [facility] at [place], Deanna C, MD   1 year ago Essential hypertension   [place] [facility] at [place], Deanna C, MD   1 year ago Essential hypertension    Primary Care [facility] Medicine at [place], [place], MD      Future Appointments            In 4 days Juline Patch, MD [facility] at [employer], [facility]

## 2022-01-02 ENCOUNTER — Ambulatory Visit (INDEPENDENT_AMBULATORY_CARE_PROVIDER_SITE_OTHER): Payer: BC Managed Care – PPO | Admitting: Family Medicine

## 2022-01-02 ENCOUNTER — Encounter: Payer: Self-pay | Admitting: Family Medicine

## 2022-01-02 VITALS — BP 118/64 | HR 72 | Ht 63.0 in | Wt 158.0 lb

## 2022-01-02 DIAGNOSIS — E039 Hypothyroidism, unspecified: Secondary | ICD-10-CM

## 2022-01-02 DIAGNOSIS — J4520 Mild intermittent asthma, uncomplicated: Secondary | ICD-10-CM

## 2022-01-02 DIAGNOSIS — J301 Allergic rhinitis due to pollen: Secondary | ICD-10-CM

## 2022-01-02 DIAGNOSIS — I10 Essential (primary) hypertension: Secondary | ICD-10-CM | POA: Diagnosis not present

## 2022-01-02 DIAGNOSIS — F3341 Major depressive disorder, recurrent, in partial remission: Secondary | ICD-10-CM | POA: Diagnosis not present

## 2022-01-02 DIAGNOSIS — Z23 Encounter for immunization: Secondary | ICD-10-CM

## 2022-01-02 DIAGNOSIS — E782 Mixed hyperlipidemia: Secondary | ICD-10-CM

## 2022-01-02 DIAGNOSIS — E1165 Type 2 diabetes mellitus with hyperglycemia: Secondary | ICD-10-CM | POA: Diagnosis not present

## 2022-01-02 MED ORDER — GLIPIZIDE ER 2.5 MG PO TB24: ORAL_TABLET | ORAL | 1 refills | Status: DC

## 2022-01-02 MED ORDER — FLUTICASONE PROPIONATE 50 MCG/ACT NA SUSP: NASAL | 1 refills | Status: DC

## 2022-01-02 MED ORDER — METFORMIN HCL ER 500 MG PO TB24: 500.0000 mg | ORAL_TABLET | Freq: Every day | ORAL | 1 refills | Status: DC

## 2022-01-02 MED ORDER — LEVOTHYROXINE SODIUM 50 MCG PO TABS: ORAL_TABLET | ORAL | 1 refills | Status: DC

## 2022-01-02 MED ORDER — LEVOTHYROXINE SODIUM 75 MCG PO TABS: ORAL_TABLET | ORAL | 1 refills | Status: DC

## 2022-01-02 MED ORDER — CITALOPRAM HYDROBROMIDE 20 MG PO TABS: 20.0000 mg | ORAL_TABLET | Freq: Two times a day (BID) | ORAL | 1 refills | Status: DC

## 2022-01-02 MED ORDER — SIMVASTATIN 20 MG PO TABS: 20.0000 mg | ORAL_TABLET | Freq: Every day | ORAL | 1 refills | Status: DC

## 2022-01-02 MED ORDER — HYDROCHLOROTHIAZIDE 25 MG PO TABS: 25.0000 mg | ORAL_TABLET | Freq: Every day | ORAL | 1 refills | Status: DC

## 2022-01-02 NOTE — Progress Notes (Signed)
Date:  01/02/2022   Name:  Phyllis Wagner   DOB:  08/24/1955   MRN:  [id number]   Chief Complaint: Hypertension, Hyperlipidemia, Diabetes, Hypothyroidism, Allergic Rhinitis , and Depression (Wants to increase to citalopram twice a day)  Hypertension This is a chronic problem. The current episode started more than 1 year ago. The problem has been gradually improving since onset. The problem is controlled. Pertinent negatives include no blurred vision, chest pain, headaches, malaise/fatigue, neck pain, palpitations, peripheral edema or shortness of breath. There are no associated agents to hypertension. There is no history of angina, kidney disease, CAD/MI, CVA, heart failure, left ventricular hypertrophy, PVD or retinopathy. Identifiable causes of hypertension include a thyroid problem. There is no history of chronic renal disease, a hypertension causing med or renovascular disease.  Hyperlipidemia This is a chronic problem. The current episode started more than 1 year ago. The problem is controlled. Recent lipid tests were reviewed and are normal. She has no history of chronic renal disease, diabetes, hypothyroidism, liver disease, obesity or nephrotic syndrome. There are no known factors aggravating her hyperlipidemia. Pertinent negatives include no chest pain, myalgias or shortness of breath. Current antihyperlipidemic treatment includes statins. The current treatment provides moderate improvement of lipids. There are no compliance problems.   Diabetes She presents for her follow-up diabetic visit. She has type 2 diabetes mellitus. Her disease course has been stable. Hypoglycemia symptoms include nervousness/anxiousness. Pertinent negatives for hypoglycemia include no dizziness or headaches. Associated symptoms include fatigue and weight loss. Pertinent negatives for diabetes include no blurred vision, no chest pain, no foot paresthesias, no foot ulcerations, no polydipsia, no  polyphagia, no polyuria, no visual change and no weakness. There are no hypoglycemic complications. Symptoms are stable. Pertinent negatives for diabetic complications include no CVA, PVD or retinopathy. Current diabetic treatment includes oral agent (dual therapy). Meal planning includes avoidance of concentrated sweets. She participates in exercise daily. Her breakfast blood glucose is taken between 8-9 am. Her breakfast blood glucose range is generally 70-90 mg/dl.  Depression        This is a chronic problem.  The current episode started more than 1 year ago.   The onset quality is gradual.   The problem occurs daily.  The problem has been gradually worsening since onset.  Associated symptoms include fatigue and insomnia.  Associated symptoms include no decreased concentration, no helplessness, no hopelessness, not irritable, no restlessness, no decreased interest, no appetite change, no body aches, no myalgias, no headaches, no indigestion, not sad and no suicidal ideas.  Compliance with treatment is good.  Previous treatment provided no relief relief.  Past medical history includes thyroid problem.     Pertinent negatives include no hypothyroidism. Insomnia Primary symptoms: no fragmented sleep, no sleep disturbance, no difficulty falling asleep, no somnolence, no frequent awakening, no premature morning awakening, no malaise/fatigue, no napping.   The problem has been gradually improving since onset. PMH includes: hypertension, depression.   Thyroid Problem Presents for follow-up visit. Symptoms include anxiety, depressed mood, fatigue and weight loss. Patient reports no constipation, diarrhea, dry skin, hair loss, nail problem, palpitations or visual change. Her past medical history is significant for hyperlipidemia. There is no history of diabetes or heart failure.    Lab Results  Component Value Date   NA 138 04/06/2021   K 4.5 04/06/2021   CO2 27 04/06/2021   GLUCOSE 111 (H) 04/06/2021    BUN 13 04/06/2021   CREATININE 0.70 04/06/2021   CALCIUM 9.9  04/06/2021   EGFR 97 04/06/2021   GFRNONAA 96 04/14/2020   Lab Results  Component Value Date   CHOL 180 04/06/2021   HDL 77 04/06/2021   LDLCALC 89 04/06/2021   TRIG 78 04/06/2021   CHOLHDL 2.5 02/04/2018   Lab Results  Component Value Date   TSH 1.410 08/15/2021   Lab Results  Component Value Date   HGBA1C 6.6 (H) 08/15/2021   Lab Results  Component Value Date   WBC 5.0 04/14/2020   HGB 11.3 04/14/2020   HCT 35.3 04/14/2020   MCV 86 04/14/2020   PLT 296 04/14/2020   Lab Results  Component Value Date   ALT 12 04/06/2021   AST 22 04/06/2021   ALKPHOS 78 04/06/2021   BILITOT 0.3 04/06/2021   No results found for: "25OHVITD2", "25OHVITD3", "VD25OH"   Review of Systems  Constitutional:  Positive for fatigue and weight loss. Negative for appetite change, chills, fever and malaise/fatigue.  HENT:  Negative for drooling, ear discharge, ear pain and sore throat.   Eyes:  Negative for blurred vision.  Respiratory:  Negative for cough, shortness of breath and wheezing.   Cardiovascular:  Negative for chest pain, palpitations and leg swelling.  Gastrointestinal:  Negative for abdominal pain, blood in stool, constipation, diarrhea and nausea.  Endocrine: Negative for polydipsia, polyphagia and polyuria.  Genitourinary:  Negative for dysuria, frequency, hematuria and urgency.  Musculoskeletal:  Negative for back pain, myalgias and neck pain.  Skin:  Negative for rash.  Allergic/Immunologic: Negative for environmental allergies.  Neurological:  Negative for dizziness, weakness and headaches.  Hematological:  Does not bruise/bleed easily.  Psychiatric/Behavioral:  Positive for depression. Negative for decreased concentration, sleep disturbance and suicidal ideas. The patient is nervous/anxious and has insomnia.     Patient Active Problem List   Diagnosis Date Noted   Calculus of gallbladder without cholecystitis  without obstruction 06/12/2017   Chronic maxillary sinusitis 03/15/2015   Chronic rhinitis 03/15/2015   Mild intermittent asthma without complication 57/03/7791   Oroantral fistula 03/15/2015   Essential hypertension 01/05/2015   Thyroid activity decreased 01/05/2015   Hyperlipidemia 01/05/2015   Recurrent major depressive disorder, in partial remission ([place]) 01/05/2015    Allergies  Allergen Reactions   Aspirin Anaphylaxis, Hives and Shortness Of Breath   Biaxin [Clarithromycin] Anaphylaxis, Hives and Shortness Of Breath   Nsaids Hives and Swelling   Penicillins Anaphylaxis, Hives and Swelling    Has patient had a PCN reaction causing immediate rash, facial/tongue/throat swelling, SOB or lightheadedness with hypotension:Yes Has patient had a PCN reaction causing severe rash involving mucus membranes or skin necrosis: Unknown Has patient had a PCN reaction that required hospitalization: Unknown Has patient had a PCN reaction occurring within the last 10 years: No Childhood reaction. If all of the above answers are "NO", then may proceed with Cephalosporin use.    Sulfa Antibiotics Hives   Ancef [Cefazolin] Rash    Past Surgical History:  Procedure Laterality Date   BUNIONECTOMY Right 07/19/2021   Procedure: BUNIONECTOMY - LAPIDUS-TYPE;  Surgeon: Samara Deist, DPM;  Location[facility];  Service: Podiatry;  Laterality: Right;   CHOLECYSTECTOMY N/A 06/21/2017   Procedure: LAPAROSCOPIC CHOLECYSTECTOMY WITH INTRAOPERATIVE CHOLANGIOGRAM;  Surgeon: Robert Bellow, MD;  Location: [facility];  Service: General;  Laterality: N/A;   COLONOSCOPY  2012   cleared for 10 yrs- Spray Bilateral 2018   FOOT ARTHRODESIS Right 07/19/2021   Procedure: ARTHRODESIS FOOT; LISFRANC; MULTIPLE;  Surgeon: Samara Deist,  DPM;  Location: [facility];  Service: Podiatry;  Laterality: Right;  Diabetic   GASTRIC BYPASS  2013   Dr Alisia Ferrari at [facility] HERNIA REPAIR  06/21/2017   Procedure: HERNIA REPAIR UMBILICAL ADULT;  Surgeon: Robert Bellow, MD;  Location: [facility];  Service: General;;   VAGINAL HYSTERECTOMY     ovaries remain    Social History   Tobacco Use   Smoking status: Former    Packs/day: 1.00    Years: 20.00    Total pack years: 20.00    Types: Cigarettes    Quit date: 06/18/2010    Years since quitting: 11.5   Smokeless tobacco: Never  Vaping Use   Vaping Use: Never used  Substance Use Topics   Alcohol use: Not Currently    Alcohol/week: 0.0 standard drinks of alcohol   Drug use: No     Medication list has been reviewed and updated.  Current Meds  Medication Sig   APPLE CIDER VINEGAR PO Take 2 tablets by mouth daily.   cholecalciferol (VITAMIN D) 1000 UNITS tablet Take 1,000 Units by mouth daily.   citalopram (CELEXA) 20 MG tablet TAKE 1 TABLET BY MOUTH EVERY DAY   EPINEPHrine 0.3 mg/0.3 mL IJ SOAJ injection Inject 0.3 mg into the muscle as needed for anaphylaxis.   fluticasone (FLONASE) 50 MCG/ACT nasal spray SPRAY 2 SPRAYS INTO EACH NOSTRIL EVERY DAY   glipiZIDE (GLUCOTROL XL) 2.5 MG 24 hr tablet TAKE 1 TABLET BY MOUTH DAILY WITH BREAKFAST.   Glucosamine-Chondroitin (COSAMIN DS PO) Take 2 tablets by mouth daily.   hydrochlorothiazide (HYDRODIURIL) 25 MG tablet TAKE 1 TABLET (25 MG TOTAL) BY MOUTH DAILY.   levothyroxine (SYNTHROID) 50 MCG tablet TAKE 1 TABLET BY MOUTH ON MONDAY, WEDNESDAY AND FRIDAY   levothyroxine (SYNTHROID) 75 MCG tablet TAKE 1 TABLET BY MOUTH EVERY T, TH, Sat, and Sunday   metFORMIN (GLUCOPHAGE-XR) 500 MG 24 hr tablet TAKE 1 TABLET BY MOUTH EVERY DAY   Omega-3 Fatty Acids (FISH OIL) 1000 MG CAPS Take 1 capsule (1,000 mg total) by mouth daily.   Polyethyl Glycol-Propyl Glycol 0.4-0.3 % SOLN Place 1 drop into both eyes 3 (three) times daily as needed (for dry eyes.).   simvastatin (ZOCOR) 20 MG tablet TAKE 1 TABLET BY MOUTH EVERY  DAY       01/02/2022    8:14 AM 08/15/2021    8:17 AM 11/03/2020    8:14 AM 04/14/2020    8:38 AM  GAD 7 : Generalized Anxiety Score  Nervous, Anxious, on Edge 0 0 0 0  Control/stop worrying 3 0 1 0  Worry too much - different things 3 0 0 0  Trouble relaxing 0 0 0 0  Restless 0 0 0 0  Easily annoyed or irritable 3 0 0 0  Afraid - awful might happen 0 0 0 0  Total GAD 7 Score 9 0 1 0  Anxiety Difficulty Somewhat difficult Not difficult at all Not difficult at all        10 /24/2023    8:13 AM 08/15/2021    8:17 AM 04/06/2021    8:19 AM  Depression screen PHQ 2/9  Decreased Interest 1 0 0  Down, Depressed, Hopeless 0 0 0  PHQ - 2 Score 1 0 0  Altered sleeping 3 1 0  Tired, decreased energy 3 3 1   Change  in appetite 2 1 0  Feeling bad or failure about yourself  0 0 0  Trouble concentrating 0 0 0  Moving slowly or fidgety/restless 0 0 0  Suicidal thoughts 0 0 0  PHQ-9 Score 9 5 1   Difficult doing work/chores Not difficult at all Not difficult at all Not difficult at all    BP Readings from Last 3 Encounters:  01/02/22 118/64  08/23/21 138/80  08/15/21 128/72    Physical Exam Vitals and nursing note reviewed. Exam conducted with a chaperone present.  Constitutional:      General: She is not irritable.She is not in acute distress.    Appearance: She is not diaphoretic.  HENT:     Head: Normocephalic and atraumatic.     Right Ear: Tympanic membrane and external ear normal.     Left Ear: Tympanic membrane and external ear normal.     Nose: Nose normal.     Mouth/Throat:     Mouth: Mucous membranes are moist.     Pharynx: No oropharyngeal exudate or posterior oropharyngeal erythema.  Eyes:     General:        Right eye: No discharge.        Left eye: No discharge.     Conjunctiva/sclera: Conjunctivae normal.     Pupils: Pupils are equal, round, and reactive to light.  Neck:     Thyroid: No thyromegaly.     Vascular: No JVD.  Cardiovascular:     Rate and Rhythm:  Normal rate and regular rhythm.     Heart sounds: Normal heart sounds. No murmur heard.    No friction rub. No gallop.  Pulmonary:     Effort: Pulmonary effort is normal.     Breath sounds: Normal breath sounds. No wheezing or rhonchi.  Abdominal:     General: Bowel sounds are normal.     Palpations: Abdomen is soft. There is no mass.     Tenderness: There is no abdominal tenderness. There is no guarding.  Musculoskeletal:        General: Normal range of motion.     Cervical back: Normal range of motion and neck supple.  Lymphadenopathy:     Cervical: No cervical adenopathy.  Skin:    General: Skin is warm and dry.  Neurological:     Mental Status: She is alert.     Deep Tendon Reflexes: Reflexes are normal and symmetric.     Wt Readings from Last 3 Encounters:  01/02/22 158 lb (71.7 kg)  08/23/21 171 lb (77.6 kg)  08/15/21 171 lb (77.6 kg)    BP 118/64   Pulse 72   Ht 5' 3"  (1.6 m)   Wt 158 lb (71.7 kg)   SpO2 97%   BMI 27.99 kg/m   Assessment and Plan: 1. Recurrent major depressive disorder, in partial remission (HCC) Chronic.  Controlled.  Stable.  PHQ is 9.  GAD score is 9.  Patient would like to increase her citalopram to 40 mg but would like to do it in a divided dose of 20 in the morning and 20 in the evening to see if this will help insomnia.  We will recheck patient in 6 weeks. - citalopram (CELEXA) 20 MG tablet; Take 1 tablet (20 mg total) by mouth in the morning and at bedtime.  Dispense: 180 tablet; Refill: 1  2. Seasonal allergic rhinitis due to pollen Chronic.  Controlled.  Stable.  Continue fluticasone nasal spray 2 sprays each nostril daily. -  fluticasone (FLONASE) 50 MCG/ACT nasal spray; SPRAY 2 SPRAYS INTO EACH NOSTRIL EVERY DAY  Dispense: 48 mL; Refill: 1  3. Uncontrolled type 2 diabetes mellitus with hyperglycemia (HCC) Chronic.  Controlled.  Stable.  Patient is currently under control with dual therapy of glipizide XL 2.5 once a day and metformin XR  500 mg once a day.  We will check A1c and renal function panel as well as microalbuminuria. - glipiZIDE (GLUCOTROL XL) 2.5 MG 24 hr tablet; TAKE 1 TABLET BY MOUTH DAILY WITH BREAKFAST.  Dispense: 90 tablet; Refill: 1 - metFORMIN (GLUCOPHAGE-XR) 500 MG 24 hr tablet; Take 1 tablet (500 mg total) by mouth daily.  Dispense: 90 tablet; Refill: 1 - HgB A1c - Renal Function Panel - Microalbumin / creatinine urine ratio  4. Essential hypertension Chronic.  Controlled.  Stable.  Continue hydrochlorothiazide 25 mg once a day.  We will check renal function panel. - hydrochlorothiazide (HYDRODIURIL) 25 MG tablet; Take 1 tablet (25 mg total) by mouth daily.  Dispense: 90 tablet; Refill: 1 - Renal Function Panel  5. Hypothyroidism, unspecified type Chronic.  Controlled.  Stable.  Today we will check TSH and pending results we will likely continue at current alternating dose of Synthroid 50 mcg and 75 mcg. - levothyroxine (SYNTHROID) 50 MCG tablet; TAKE 1 TABLET BY MOUTH ON MONDAY, WEDNESDAY AND FRIDAY  Dispense: 36 tablet; Refill: 1 - levothyroxine (SYNTHROID) 75 MCG tablet; TAKE 1 TABLET BY MOUTH EVERY T, TH, Sat, and Sunday  Dispense: 48 tablet; Refill: 1 - TSH  6. Mild intermittent reactive airway disease without complication Chronic.  Controlled.  Stable.  Patient will continue albuterol on a as needed basis.  7. Mixed hyperlipidemia 8.  Controlled.  Stable.  Continue simvastatin 20 mg once a day.  Will check lipid panel for current status of control. - simvastatin (ZOCOR) 20 MG tablet; Take 1 tablet (20 mg total) by mouth daily.  Dispense: 90 tablet; Refill: 1 - Lipid Panel With LDL/HDL Ratio  8. Need for immunization against influenza Discussed and administered. - Flu Vaccine QUAD 13moIM (Fluarix, Fluzone & Alfiuria Quad PF)     DOtilio Miu MD

## 2022-01-04 LAB — RENAL FUNCTION PANEL
Albumin: 4.5 g/dL (ref 3.9–4.9)
BUN/Creatinine Ratio: 13 (ref 12–28)
BUN: 9 mg/dL (ref 8–27)
CO2: 25 mmol/L (ref 20–29)
Calcium: 9.7 mg/dL (ref 8.7–10.3)
Chloride: 101 mmol/L (ref 96–106)
Creatinine, Ser: 0.69 mg/dL (ref 0.57–1.00)
Glucose: 109 mg/dL — ABNORMAL HIGH (ref 70–99)
Phosphorus: 3.8 mg/dL (ref 3.0–4.3)
Potassium: 4.3 mmol/L (ref 3.5–5.2)
Sodium: 141 mmol/L (ref 134–144)
eGFR: 97 mL/min/{1.73_m2} (ref >59)

## 2022-01-04 LAB — TSH: TSH: 1.05 u[IU]/mL (ref 0.450–4.500)

## 2022-01-04 LAB — HEMOGLOBIN A1C
Est. average glucose Bld gHb Est-mCnc: 146 mg/dL
Hgb A1c MFr Bld: 6.7 % — ABNORMAL HIGH (ref 4.8–5.6)

## 2022-01-04 LAB — MICROALBUMIN / CREATININE URINE RATIO
Creatinine, Urine: 113.5 mg/dL
Microalb/Creat Ratio: 14 mg/g creat (ref 0–29)
Microalbumin, Urine: 15.9 ug/mL

## 2022-01-04 LAB — LIPID PANEL WITH LDL/HDL RATIO
Cholesterol, Total: 169 mg/dL (ref 100–199)
HDL: 89 mg/dL (ref >39)
LDL Chol Calc (NIH): 69 mg/dL (ref 0–99)
LDL/HDL Ratio: 0.8 ratio (ref 0.0–3.2)
Triglycerides: 57 mg/dL (ref 0–149)
VLDL Cholesterol Cal: 11 mg/dL (ref 5–40)

## 2022-01-10 DIAGNOSIS — E119 Type 2 diabetes mellitus without complications: Secondary | ICD-10-CM | POA: Diagnosis not present

## 2022-01-24 ENCOUNTER — Other Ambulatory Visit: Payer: Self-pay | Admitting: Family Medicine

## 2022-01-24 DIAGNOSIS — J4520 Mild intermittent asthma, uncomplicated: Secondary | ICD-10-CM

## 2022-01-24 NOTE — Telephone Encounter (Signed)
Requested medication (s) are due for refill today: yes  Requested medication (s) are on the active medication list: yes  Last refill:  10/30/21 #90 0 refills  Future visit scheduled: yes in 3 weeks  Notes to clinic:  no refills remain. Do you want to refill Rx?     Requested Prescriptions  Pending Prescriptions Disp Refills   montelukast (SINGULAIR) 10 MG tablet [Pharmacy Med Name: MONTELUKAST SOD 10 MG TABLET] 90 tablet 0    Sig: TAKE 1 TABLET BY MOUTH EVERYDAY AT BEDTIME     Pulmonology:  Leukotriene Inhibitors Passed - 01/24/2022  2:09 AM      Passed - Valid encounter within last 12 months    Recent Outpatient Visits           3 weeks ago Essential hypertension   [place] [facility]   5 months ago Urticaria   [place] [facility]   5 months ago Chronic maxillary sinusitis   [place] [facility]   9 months ago Essential hypertension   [place] [facility]   1 year ago Essential hypertension   [place] [facility]       Future Appointments             In 3 weeks Duanne Limerick, MD [facility] at [facility]   In 3 months Duanne Limerick, MD [facility] at [facility]

## 2022-02-09 DIAGNOSIS — E119 Type 2 diabetes mellitus without complications: Secondary | ICD-10-CM | POA: Diagnosis not present

## 2022-02-15 ENCOUNTER — Ambulatory Visit: Payer: BC Managed Care – PPO | Admitting: Family Medicine

## 2022-02-19 DIAGNOSIS — M2012 Hallux valgus (acquired), left foot: Secondary | ICD-10-CM | POA: Diagnosis not present

## 2022-02-20 ENCOUNTER — Other Ambulatory Visit: Payer: Self-pay | Admitting: Podiatry

## 2022-02-22 ENCOUNTER — Encounter: Payer: Self-pay | Admitting: Podiatry

## 2022-02-22 NOTE — Discharge Instructions (Addendum)
[facility]  POST OPERATIVE INSTRUCTIONS FOR DR. FOWLER AND [facility]   Take your medication as prescribed.  Pain medication should be taken only as needed.  Keep the dressing clean, dry and intact.  Keep your foot elevated above the heart level for the first 48 hours.  Walking to the bathroom and brief periods of walking are acceptable, unless we have instructed you to be non-weight bearing.  Always wear your post-op shoe when walking.  Always use your crutches if you are to be non-weight bearing.  Do not take a shower. Baths are permissible as long as the foot is kept out of the water.   Every hour you are awake:  Bend your knee 15 times. Flex foot 15 times Massage calf 15 times  Call [facility] ([phone]) if any of the following problems occur: You develop a temperature or fever. The bandage becomes saturated with blood. Medication does not stop your pain. Injury of the foot occurs. Any symptoms of infection including redness, odor, or red streaks running from wound.  Information for Discharge Teaching: EXPAREL (bupivacaine liposome injectable suspension)   Your surgeon or anesthesiologist gave you EXPAREL(bupivacaine) to help control your pain after surgery.  EXPAREL is a local anesthetic that provides pain relief by numbing the tissue around the surgical site. EXPAREL is designed to release pain medication over time and can control pain for up to 72 hours. Depending on how you respond to EXPAREL, you may require less pain medication during your recovery.  Possible side effects: Temporary loss of sensation or ability to move in the area where bupivacaine was injected. Nausea, vomiting, constipation Rarely, numbness and tingling in your mouth or lips, lightheadedness, or anxiety may occur. Call your doctor right away if you think you may be experiencing any of these sensations, or if  you have other questions regarding possible side effects.  Follow all other discharge instructions given to you by your surgeon or nurse. Eat a healthy diet and drink plenty of water or other fluids.  If you return to the hospital for any reason within 96 hours following the administration of EXPAREL, it is important for health care providers to know that you have received this anesthetic. A teal colored band has been placed on your arm with the date, time and amount of EXPAREL you have received in order to alert and inform your health care providers. Please leave this armband in place for the full 96 hours following administration, and then you may remove the band.   

## 2022-02-26 NOTE — Anesthesia Preprocedure Evaluation (Addendum)
Anesthesia Evaluation  Patient identified by MRN, date of birth, ID band Patient awake    Reviewed: Allergy & Precautions, NPO status , Patient's Chart, lab work & pertinent test results  Airway Mallampati: II  TM Distance: >3 FB     Dental  (+) Partial Upper, Missing,    Pulmonary former smoker   Pulmonary exam normal        Cardiovascular hypertension,  Rhythm:Regular Rate:Normal  HLD   Neuro/Psych  PSYCHIATRIC DISORDERS  Depression       GI/Hepatic ,GERD  Controlled,,  Endo/Other  diabetes, Type 2Hypothyroidism  BMI > 30  Renal/GU      Musculoskeletal   Abdominal Normal abdominal exam  (+)   Peds  Hematology   Anesthesia Other Findings   Reproductive/Obstetrics                             Anesthesia Physical Anesthesia Plan  ASA: 2  Anesthesia Plan: General   Post-op Pain Management:    Induction: Intravenous  PONV Risk Score and Plan: Treatment may vary due to age or medical condition  Airway Management Planned: LMA  Additional Equipment:   Intra-op Plan:   Post-operative Plan: Extubation in OR  Informed Consent: I have reviewed the patients History and Physical, chart, labs and discussed the procedure including the risks, benefits and alternatives for the proposed anesthesia with the patient or authorized representative who has indicated his/her understanding and acceptance.     Dental advisory given  Plan Discussed with: CRNA  Anesthesia Plan Comments:         Anesthesia Quick Evaluation

## 2022-02-28 ENCOUNTER — Other Ambulatory Visit: Payer: Self-pay

## 2022-02-28 ENCOUNTER — Ambulatory Visit: Payer: BLUE CROSS/BLUE SHIELD | Admitting: Anesthesiology

## 2022-02-28 ENCOUNTER — Ambulatory Visit: Payer: Self-pay

## 2022-02-28 ENCOUNTER — Encounter
Admission: RE | Disposition: A | Discharge status: Home / Self Care | Payer: Self-pay | Source: Ambulatory Visit | Attending: Podiatry

## 2022-02-28 ENCOUNTER — Other Ambulatory Visit
Admission: RE | Admit: 2022-02-28 | Discharge: 2022-02-28 | Disposition: A | Discharge status: Home / Self Care | Payer: BLUE CROSS/BLUE SHIELD | Attending: Anesthesiology | Admitting: Anesthesiology

## 2022-02-28 ENCOUNTER — Ambulatory Visit
Admission: RE | Admit: 2022-02-28 | Discharge: 2022-02-28 | Disposition: A | Discharge status: Home / Self Care | Payer: BLUE CROSS/BLUE SHIELD | Source: Ambulatory Visit | Attending: Podiatry | Admitting: Podiatry

## 2022-02-28 ENCOUNTER — Encounter: Payer: Self-pay | Admitting: Podiatry

## 2022-02-28 DIAGNOSIS — E785 Hyperlipidemia, unspecified: Secondary | ICD-10-CM | POA: Insufficient documentation

## 2022-02-28 DIAGNOSIS — M2011 Hallux valgus (acquired), right foot: Secondary | ICD-10-CM | POA: Diagnosis not present

## 2022-02-28 DIAGNOSIS — I10 Essential (primary) hypertension: Secondary | ICD-10-CM | POA: Insufficient documentation

## 2022-02-28 DIAGNOSIS — M2012 Hallux valgus (acquired), left foot: Secondary | ICD-10-CM | POA: Diagnosis not present

## 2022-02-28 DIAGNOSIS — E119 Type 2 diabetes mellitus without complications: Secondary | ICD-10-CM

## 2022-02-28 DIAGNOSIS — Z87891 Personal history of nicotine dependence: Secondary | ICD-10-CM | POA: Insufficient documentation

## 2022-02-28 DIAGNOSIS — K219 Gastro-esophageal reflux disease without esophagitis: Secondary | ICD-10-CM | POA: Insufficient documentation

## 2022-02-28 DIAGNOSIS — E039 Hypothyroidism, unspecified: Secondary | ICD-10-CM | POA: Insufficient documentation

## 2022-02-28 HISTORY — DX: Unspecified osteoarthritis, unspecified site: M19.90

## 2022-02-28 HISTORY — PX: BUNIONECTOMY: SHX129

## 2022-02-28 LAB — GLUCOSE, CAPILLARY: Glucose-Capillary: 119 mg/dL — ABNORMAL HIGH (ref 70–99)

## 2022-02-28 LAB — POTASSIUM: Potassium: 3.4 mmol/L — ABNORMAL LOW (ref 3.5–5.1)

## 2022-02-28 SURGERY — BUNIONECTOMY
Anesthesia: General | Site: Toe | Laterality: Left

## 2022-02-28 MED ORDER — OXYCODONE HCL 5 MG/5ML PO SOLN: 5.0000 mg | Freq: Once | ORAL | Status: DC | PRN

## 2022-02-28 MED ORDER — FENTANYL CITRATE PF 50 MCG/ML IJ SOSY: 25.0000 ug | PREFILLED_SYRINGE | INTRAMUSCULAR | Status: DC | PRN

## 2022-02-28 MED ORDER — SODIUM CHLORIDE 0.9 % IR SOLN
Status: DC | PRN
  Administered 2022-02-28: 500 mL

## 2022-02-28 MED ORDER — BUPIVACAINE LIPOSOME 1.3 % IJ SUSP
INTRAMUSCULAR | Status: DC | PRN
  Administered 2022-02-28 (×2): 10 mL

## 2022-02-28 MED ORDER — PROMETHAZINE HCL 25 MG/ML IJ SOLN: 6.2500 mg | INTRAMUSCULAR | Status: DC | PRN

## 2022-02-28 MED ORDER — PROPOFOL 10 MG/ML IV BOLUS
INTRAVENOUS | Status: DC | PRN
  Administered 2022-02-28: 150 mg via INTRAVENOUS

## 2022-02-28 MED ORDER — ONDANSETRON HCL 4 MG/2ML IJ SOLN
INTRAMUSCULAR | Status: DC | PRN
  Administered 2022-02-28: 4 mg via INTRAVENOUS

## 2022-02-28 MED ORDER — LACTATED RINGERS IV SOLN: INTRAVENOUS | Status: DC

## 2022-02-28 MED ORDER — DEXAMETHASONE SODIUM PHOSPHATE 4 MG/ML IJ SOLN
INTRAMUSCULAR | Status: DC | PRN
  Administered 2022-02-28: 4 mg via INTRAVENOUS

## 2022-02-28 MED ORDER — BUPIVACAINE HCL (PF) 0.25 % IJ SOLN
INTRAMUSCULAR | Status: DC | PRN
  Administered 2022-02-28: 10 mL

## 2022-02-28 MED ORDER — OXYCODONE-ACETAMINOPHEN 5-325 MG PO TABS: 1.0000 | ORAL_TABLET | Freq: Four times a day (QID) | ORAL | 0 refills | Status: DC | PRN

## 2022-02-28 MED ORDER — CLINDAMYCIN PHOSPHATE 600 MG/50ML IV SOLN
INTRAVENOUS | Status: DC | PRN
  Administered 2022-02-28: 600 mg via INTRAVENOUS

## 2022-02-28 MED ORDER — ACETAMINOPHEN 10 MG/ML IV SOLN: 1000.0000 mg | Freq: Once | INTRAVENOUS | Status: DC | PRN

## 2022-02-28 MED ORDER — DROPERIDOL 2.5 MG/ML IJ SOLN: 0.6250 mg | Freq: Once | INTRAMUSCULAR | Status: DC | PRN

## 2022-02-28 MED ORDER — MIDAZOLAM HCL 5 MG/5ML IJ SOLN
INTRAMUSCULAR | Status: DC | PRN
  Administered 2022-02-28: 2 mg via INTRAVENOUS

## 2022-02-28 MED ORDER — FENTANYL CITRATE (PF) 100 MCG/2ML IJ SOLN
INTRAMUSCULAR | Status: DC | PRN
  Administered 2022-02-28: 50 ug via INTRAVENOUS
  Administered 2022-02-28 (×2): 25 ug via INTRAVENOUS

## 2022-02-28 MED ORDER — ACETAMINOPHEN 500 MG PO TABS
1000.0000 mg | ORAL_TABLET | Freq: Once | ORAL | Status: AC
  Administered 2022-02-28: 1000 mg via ORAL

## 2022-02-28 MED ORDER — OXYCODONE HCL 5 MG PO TABS: 5.0000 mg | ORAL_TABLET | Freq: Once | ORAL | Status: DC | PRN

## 2022-02-28 MED ORDER — TRAMADOL HCL 50 MG PO TABS: 50.0000 mg | ORAL_TABLET | Freq: Four times a day (QID) | ORAL | 0 refills | Status: AC | PRN

## 2022-02-28 SURGICAL SUPPLY — 41 items
APL SKNCLS STERI-STRIP NONHPOA (GAUZE/BANDAGES/DRESSINGS) ×1
BENZOIN TINCTURE PRP APPL 2/3 (GAUZE/BANDAGES/DRESSINGS) ×1 IMPLANT
BLADE SAW LAPIPLASTY 40X11 (BLADE) IMPLANT
BLADE SURG 15 STRL LF DISP TIS (BLADE) IMPLANT
BLADE SURG 15 STRL SS (BLADE) ×3
BNDG CMPR 75X41 PLY HI ABS (GAUZE/BANDAGES/DRESSINGS) ×1
BNDG ELASTIC 4X5.8 VLCR STR LF (GAUZE/BANDAGES/DRESSINGS) ×1 IMPLANT
BNDG ESMARK 4X12 TAN STRL LF (GAUZE/BANDAGES/DRESSINGS) ×1 IMPLANT
BNDG STRETCH 4X75 STRL LF (GAUZE/BANDAGES/DRESSINGS) ×1 IMPLANT
BOOT STEPPER DURA SM (SOFTGOODS) IMPLANT
CANISTER SUCT 1200ML W/VALVE (MISCELLANEOUS) ×1 IMPLANT
COVER LIGHT HANDLE UNIVERSAL (MISCELLANEOUS) ×2 IMPLANT
CUFF TOURN SGL QUICK 18X4 (TOURNIQUET CUFF) IMPLANT
DRAPE FLUOR MINI C-ARM 54X84 (DRAPES) ×1 IMPLANT
DURAPREP 26ML APPLICATOR (WOUND CARE) ×1 IMPLANT
ELECT REM PT RETURN 9FT ADLT (ELECTROSURGICAL) ×1
ELECTRODE REM PT RTRN 9FT ADLT (ELECTROSURGICAL) ×1 IMPLANT
GAUZE SPONGE 4X4 12PLY STRL (GAUZE/BANDAGES/DRESSINGS) ×1 IMPLANT
GAUZE XEROFORM 1X8 LF (GAUZE/BANDAGES/DRESSINGS) ×1 IMPLANT
GLOVE SRG 8 PF TXTR STRL LF DI (GLOVE) ×2 IMPLANT
GLOVE SURG ENC MOIS LTX SZ7.5 (GLOVE) ×2 IMPLANT
GLOVE SURG UNDER POLY LF SZ8 (GLOVE) ×2
GOWN STRL REUS W/ TWL LRG LVL3 (GOWN DISPOSABLE) ×2 IMPLANT
GOWN STRL REUS W/TWL LRG LVL3 (GOWN DISPOSABLE) ×2
KIT TURNOVER KIT A (KITS) ×1 IMPLANT
LAPIPLASTY SYS 4A (Orthopedic Implant) ×1 IMPLANT
NS IRRIG 500ML POUR BTL (IV SOLUTION) ×1 IMPLANT
PACK EXTREMITY ARMC (MISCELLANEOUS) ×1 IMPLANT
RASP SM TEAR CROSS CUT (RASP) IMPLANT
SCREW 2.7 HIGH PITCH LOCKING (Screw) IMPLANT
SCREW HIGH PITCH LOCK 2.7 (Screw) IMPLANT
STOCKINETTE IMPERVIOUS LG (DRAPES) ×1 IMPLANT
STRIP CLOSURE SKIN 1/4X4 (GAUZE/BANDAGES/DRESSINGS) ×1 IMPLANT
SUT ETHILON 3-0 (SUTURE) IMPLANT
SUT MNCRL 4-0 (SUTURE) ×1
SUT MNCRL 4-0 27XMFL (SUTURE) ×1
SUT VIC AB 3-0 SH 27 (SUTURE) ×2
SUT VIC AB 3-0 SH 27X BRD (SUTURE) IMPLANT
SUT VIC AB 4-0 PS2 18 (SUTURE) IMPLANT
SUTURE MNCRL 4-0 27XMF (SUTURE) IMPLANT
SYSTEM LAPIPLASTY 4A (Orthopedic Implant) IMPLANT

## 2022-02-28 NOTE — Anesthesia Procedure Notes (Signed)
Procedure Name: LMA Insertion Date/Time: 02/28/2022 12:20 PM  Performed by: Domenic Moras, CRNAPre-anesthesia Checklist: Patient identified, Emergency Drugs available, Suction available and Patient being monitored Patient Re-evaluated:Patient Re-evaluated prior to induction Oxygen Delivery Method: Circle system utilized Preoxygenation: Pre-oxygenation with 100% oxygen Induction Type: IV induction Ventilation: Mask ventilation without difficulty LMA: LMA inserted LMA Size: 4.0 Tube type: Oral Number of attempts: 1 Placement Confirmation: positive ETCO2 and breath sounds checked- equal and bilateral Tube secured with: Tape Dental Injury: Teeth and Oropharynx as per pre-operative assessment

## 2022-02-28 NOTE — Transfer of Care (Signed)
Immediate Anesthesia Transfer of Care Note  Patient: Phyllis Wagner  Procedure(s) Performed: Dorena Dew TYPE (Left: Toe)  Patient Location: PACU  Anesthesia Type: General  Level of Consciousness: awake, alert  and patient cooperative  Airway and Oxygen Therapy: Patient Spontanous Breathing and Patient connected to supplemental oxygen  Post-op Assessment: Post-op Vital signs reviewed, Patient's Cardiovascular Status Stable, Respiratory Function Stable, Patent Airway and No signs of Nausea or vomiting  Post-op Vital Signs: Reviewed and stable  Complications: No notable events documented.

## 2022-02-28 NOTE — H&P (Addendum)
HISTORY AND PHYSICAL INTERVAL NOTE:  02/28/2022  11:52 AM  Phyllis Wagner  has presented today for surgery, with the diagnosis of  M20.12 - Hallux valgus of left foot G57.91 - Neuritis of foot, right.  The various methods of treatment have been discussed with the patient.  No guarantees were given.  After consideration of risks, benefits and other options for treatment, the patient has consented to surgery.  I have reviewed the patients' chart and labs.     A history and physical examination was performed in my office.  The patient was reexamined.  There have been no changes to this history and physical examination.  Gwyneth Revels A

## 2022-02-28 NOTE — Op Note (Signed)
Operative note   Surgeon:Doralyn Kirkes Lawyer: None    Preop diagnosis: Hallux valgus deformity left foot    Postop diagnosis: Same    Procedure: 1.Lapidus hallux valgus correction left foot 2.  Intraoperative fluoroscopy without assistance of radiologist    EBL: Minimal    Anesthesia:local and general.  Local consisted of a total of 10 cc of 0.25% bupivacaine and 20 cc of Exparel long-acting anesthetic    Hemostasis: [place] tourniquet inflated to 200 mmHg for approximately 95 minutes    Specimen: None    Complications: None    Operative indications:Phyllis Wagner is an 66 y.o. that presents today for surgical intervention.  The risks/benefits/alternatives/complications have been discussed and consent has been given.    Procedure:  Patient was brought into the OR and placed on the operating table in thesupine position. After anesthesia was obtained theleft lower extremity was prepped and draped in usual sterile fashion.  Attention was directed to the dorsal aspect of the foot where a dorsal incision was made at the first met cuneiforms joint.  Sharp and blunt dissection was carried down to the periosteum.  Subperiosteal dissection was then undertaken.  This exposed the first met cuneiform joint.  This was then freed and loosened.  A small fulcrum was placed between the base of the first metatarsal and second metatarsal.  Next the joint positioner was placed on the medial aspect of the metatarsal.  A small stab incision was made at the second metatarsal.  Compression was placed for the positioner.  Good realignment of the first intermetatarsal angle was noted at this time.  Attention was then directed to the dorsomedial first MTPJ where an incision was performed.  Sharp and blunt dissection was carried down to the capsule.  The intermetatarsal space was then entered.  The conjoined tendon of the abductor was then freed from the base of the proximal phalanx.  Attention  was to redirected to the first met cuneiform joint.  At this time the osteotomy cut guide was placed into the joint region.  2 vertical cuts were then made.  The cartilage material was removed from the first met cuneiform joint and the joint was then prepped with a 2.0 mm drill bit.  The joint compressor was then placed.  Good compression and realignment was noted.  Next the medial and dorsal locking plates were then placed from the [place] set.  Realignment and stability was noted in all planes.  Attention was redirected to the dorsomedial first MTPJ.  A small T capsulotomy was performed.  The dorsomedial eminence was noted and transected and smoothed with a power rasp.  A small capsulorrhaphy was performed.  Closure was then performed after all areas were irrigated.  3-0 Vicryl for the capsular tissue.  4-0 Vicryl in subcutaneous tissue and a 4-0 Monocryl for the skin.   Further local anesthesia was performed at the end of the case.    Patient tolerated the procedure and anesthesia well.  Was transported from the OR to the PACU with all vital signs stable and vascular status intact. To be discharged per routine protocol.  Will follow up in approximately 1 week in the outpatient clinic.

## 2022-02-28 NOTE — Anesthesia Postprocedure Evaluation (Signed)
Anesthesia Post Note  Patient: Phyllis Wagner  Procedure(s) Performed: Dorena Dew TYPE (Left: Toe)  Patient location during evaluation: PACU Anesthesia Type: General Level of consciousness: awake and alert Pain management: pain level controlled Vital Signs Assessment: post-procedure vital signs reviewed and stable Respiratory status: spontaneous breathing, nonlabored ventilation and respiratory function stable Cardiovascular status: blood pressure returned to baseline and stable Postop Assessment: no apparent nausea or vomiting Anesthetic complications: no   No notable events documented.   Last Vitals:  Vitals:   02/28/22 1430 02/28/22 1435  BP: (!) 107/54   Pulse: 73 73  Resp: 12 12  Temp:  36.5 C  SpO2: 96% 96%    Last Pain:  Vitals:   02/28/22 1430  TempSrc:   PainSc: 0-No pain                 Foye Deer

## 2022-03-01 ENCOUNTER — Encounter: Payer: Self-pay | Admitting: Podiatry

## 2022-03-02 ENCOUNTER — Encounter: Payer: Self-pay | Admitting: Podiatry

## 2022-03-06 DIAGNOSIS — M2012 Hallux valgus (acquired), left foot: Secondary | ICD-10-CM | POA: Diagnosis not present

## 2022-03-15 DIAGNOSIS — Z1231 Encounter for screening mammogram for malignant neoplasm of breast: Secondary | ICD-10-CM | POA: Diagnosis not present

## 2022-04-16 DIAGNOSIS — M2012 Hallux valgus (acquired), left foot: Secondary | ICD-10-CM | POA: Diagnosis not present

## 2022-05-07 ENCOUNTER — Encounter: Payer: Self-pay | Admitting: Family Medicine

## 2022-05-07 ENCOUNTER — Ambulatory Visit (INDEPENDENT_AMBULATORY_CARE_PROVIDER_SITE_OTHER): Payer: BC Managed Care – PPO | Admitting: Family Medicine

## 2022-05-07 VITALS — BP 128/76 | HR 66 | Ht 62.5 in | Wt 155.0 lb

## 2022-05-07 DIAGNOSIS — H6501 Acute serous otitis media, right ear: Secondary | ICD-10-CM | POA: Diagnosis not present

## 2022-05-07 DIAGNOSIS — E119 Type 2 diabetes mellitus without complications: Secondary | ICD-10-CM

## 2022-05-07 MED ORDER — GLIPIZIDE ER 2.5 MG PO TB24: ORAL_TABLET | ORAL | 1 refills | Status: DC

## 2022-05-07 MED ORDER — METFORMIN HCL ER 500 MG PO TB24: 500.0000 mg | ORAL_TABLET | Freq: Every day | ORAL | 1 refills | Status: DC

## 2022-05-07 NOTE — Progress Notes (Signed)
Date:  05/07/2022   Name:  Phyllis Wagner   DOB:  12-Jul-1955   MRN:  [id number]   Chief Complaint: Diabetes  Diabetes She presents for her follow-up diabetic visit. She has type 2 diabetes mellitus. Her disease course has been stable. There are no hypoglycemic associated symptoms. There are no diabetic associated symptoms. Pertinent negatives for diabetes include no chest pain, no fatigue, no polydipsia and no polyuria. There are no hypoglycemic complications. Symptoms are stable. There are no diabetic complications. There are no known risk factors for coronary artery disease. When asked about current treatments, none were reported. Her weight is fluctuating minimally. She is following a generally healthy diet. Meal planning includes avoidance of concentrated sweets and carbohydrate counting. She participates in exercise intermittently. Her breakfast blood glucose is taken between 8-9 am. Her breakfast blood glucose range is generally 110-130 mg/dl. An ACE inhibitor/angiotensin II receptor blocker is being taken.    Lab Results  Component Value Date   NA 141 01/02/2022   K 3.4 (L) 02/28/2022   CO2 25 01/02/2022   GLUCOSE 109 (H) 01/02/2022   BUN 9 01/02/2022   CREATININE 0.69 01/02/2022   CALCIUM 9.7 01/02/2022   EGFR 97 01/02/2022   GFRNONAA 96 04/14/2020   Lab Results  Component Value Date   CHOL 169 01/02/2022   HDL 89 01/02/2022   LDLCALC 69 01/02/2022   TRIG 57 01/02/2022   CHOLHDL 2.5 02/04/2018   Lab Results  Component Value Date   TSH 1.050 01/02/2022   Lab Results  Component Value Date   HGBA1C 6.7 (H) 01/02/2022   Lab Results  Component Value Date   WBC 5.0 04/14/2020   HGB 11.3 04/14/2020   HCT 35.3 04/14/2020   MCV 86 04/14/2020   PLT 296 04/14/2020   Lab Results  Component Value Date   ALT 12 04/06/2021   AST 22 04/06/2021   ALKPHOS 78 04/06/2021   BILITOT 0.3 04/06/2021   No results found for: "25OHVITD2", "25OHVITD3", "VD25OH"    Review of Systems  Constitutional:  Negative for diaphoresis, fatigue and unexpected weight change.  HENT:  Negative for trouble swallowing.   Respiratory:  Negative for chest tightness, shortness of breath and wheezing.   Cardiovascular:  Negative for chest pain and palpitations.  Gastrointestinal:  Negative for abdominal pain and blood in stool.  Endocrine: Positive for cold intolerance. Negative for polydipsia and polyuria.  Genitourinary:  Negative for difficulty urinating and menstrual problem.    Patient Active Problem List   Diagnosis Date Noted   Calculus of gallbladder without cholecystitis without obstruction 06/12/2017   Chronic maxillary sinusitis 03/15/2015   Chronic rhinitis 03/15/2015   Mild intermittent asthma without complication 0000000   Oroantral fistula 03/15/2015   Essential hypertension 01/05/2015   Thyroid activity decreased 01/05/2015   Hyperlipidemia 01/05/2015   Recurrent major depressive disorder, in partial remission ([place]) 01/05/2015    Allergies  Allergen Reactions   Aspirin Anaphylaxis, Hives and Shortness Of Breath   Biaxin [Clarithromycin] Anaphylaxis, Hives and Shortness Of Breath   Nsaids Hives and Swelling   Penicillins Anaphylaxis, Hives and Swelling    Has patient had a PCN reaction causing immediate rash, facial/tongue/throat swelling, SOB or lightheadedness with hypotension:Yes Has patient had a PCN reaction causing severe rash involving mucus membranes or skin necrosis: Unknown Has patient had a PCN reaction that required hospitalization: Unknown Has patient had a PCN reaction occurring within the last 10 years: No Childhood reaction. If all of the  above answers are "NO", then may proceed with Cephalosporin use.    Sulfa Antibiotics Hives   Ancef [Cefazolin] Rash    Past Surgical History:  Procedure Laterality Date   BUNIONECTOMY Right 07/19/2021   Procedure: BUNIONECTOMY - LAPIDUS-TYPE;  Surgeon: Samara Deist, DPM;   Location: [place];  Service: Podiatry;  Laterality: Right;   BUNIONECTOMY Left 02/28/2022   Procedure: BUNIONECTOMY - LAPIDUS TYPE;  Surgeon: Samara Deist, DPM;  Location: [place];  Service: Podiatry;  Laterality: Left;  GENERAL WITH LOCAL   CHOLECYSTECTOMY N/A 06/21/2017   Procedure: LAPAROSCOPIC CHOLECYSTECTOMY WITH INTRAOPERATIVE CHOLANGIOGRAM;  Surgeon: Robert Bellow, MD;  Location: [facility];  Service: General;  Laterality: N/A;   COLONOSCOPY  2012   cleared for 10 yrs- [place] Bilateral 2018   FOOT ARTHRODESIS Right 07/19/2021   Procedure: ARTHRODESIS FOOT; LISFRANC; MULTIPLE;  Surgeon: Samara Deist, DPM;  Location: [place];  Service: Podiatry;  Laterality: Right;  Diabetic   GASTRIC BYPASS  2013   Dr Alisia Ferrari at [place] HERNIA REPAIR  06/21/2017   Procedure: HERNIA REPAIR UMBILICAL ADULT;  Surgeon: Robert Bellow, MD;  Location: [facility];  Service: General;;   VAGINAL HYSTERECTOMY     ovaries remain    Social History   Tobacco Use   Smoking status: Former    Packs/day: 1.00    Years: 20.00    Total pack years: 20.00    Types: Cigarettes    Quit date: 06/18/2010    Years since quitting: 11.8   Smokeless tobacco: Never  Vaping Use   Vaping Use: Never used  Substance Use Topics   Alcohol use: Not Currently    Alcohol/week: 0.0 standard drinks of alcohol   Drug use: No     Medication list has been reviewed and updated.  Current Meds  Medication Sig   albuterol (VENTOLIN HFA) 108 (90 Base) MCG/ACT inhaler TAKE 2 PUFFS BY MOUTH EVERY 6 HOURS AS NEEDED FOR WHEEZE OR SHORTNESS OF BREATH   cholecalciferol (VITAMIN D) 1000 UNITS tablet Take 1,000 Units by mouth daily.   citalopram (CELEXA) 20 MG tablet Take 1 tablet (20 mg total) by mouth in the morning and at bedtime. (Patient taking differently: Take 20 mg by mouth daily.)   EPINEPHrine  0.3 mg/0.3 mL IJ SOAJ injection Inject 0.3 mg into the muscle as needed for anaphylaxis.   fluticasone (FLONASE) 50 MCG/ACT nasal spray SPRAY 2 SPRAYS INTO EACH NOSTRIL EVERY DAY   gabapentin (NEURONTIN) 300 MG capsule Take 300 mg by mouth at bedtime.   glipiZIDE (GLUCOTROL XL) 2.5 MG 24 hr tablet TAKE 1 TABLET BY MOUTH DAILY WITH BREAKFAST.   Glucosamine-Chondroitin (COSAMIN DS PO) Take 2 tablets by mouth daily.   hydrochlorothiazide (HYDRODIURIL) 25 MG tablet Take 1 tablet (25 mg total) by mouth daily.   levothyroxine (SYNTHROID) 50 MCG tablet TAKE 1 TABLET BY MOUTH ON MONDAY, WEDNESDAY AND FRIDAY   levothyroxine (SYNTHROID) 75 MCG tablet TAKE 1 TABLET BY MOUTH EVERY T, TH, Sat, and Sunday   metFORMIN (GLUCOPHAGE-XR) 500 MG 24 hr tablet Take 1 tablet (500 mg total) by mouth daily.   Omega-3 Fatty Acids (FISH OIL) 1000 MG CAPS Take 1 capsule (1,000 mg total) by mouth daily.   Polyethyl Glycol-Propyl Glycol 0.4-0.3 % SOLN Place 1 drop into both eyes 3 (three) times daily as needed (  for dry eyes.).   potassium chloride (MICRO-K) 10 MEQ CR capsule Take 10 mEq by mouth daily.   simvastatin (ZOCOR) 20 MG tablet Take 1 tablet (20 mg total) by mouth daily.   traMADol (ULTRAM) 50 MG tablet Take 1 tablet (50 mg total) by mouth every 6 (six) hours as needed.       05/07/2022    9:31 AM 01/02/2022    8:14 AM 08/15/2021    8:17 AM 11/03/2020    8:14 AM  GAD 7 : Generalized Anxiety Score  Nervous, Anxious, on Edge 0 0 0 0  Control/stop worrying 0 3 0 1  Worry too much - different things 0 3 0 0  Trouble relaxing 0 0 0 0  Restless 0 0 0 0  Easily annoyed or irritable 0 3 0 0  Afraid - awful might happen 0 0 0 0  Total GAD 7 Score 0 9 0 1  Anxiety Difficulty Not difficult at all Somewhat difficult Not difficult at all Not difficult at all       05/07/2022    9:30 AM 01/02/2022    8:13 AM 08/15/2021    8:17 AM  Depression screen PHQ 2/9  Decreased Interest 0 1 0  Down, Depressed, Hopeless 0 0 0   PHQ - 2 Score 0 1 0  Altered sleeping 0 3 1  Tired, decreased energy 0 3 3  Change in appetite 0 2 1  Feeling bad or failure about yourself  0 0 0  Trouble concentrating 0 0 0  Moving slowly or fidgety/restless 0 0 0  Suicidal thoughts 0 0 0  PHQ-9 Score 0 9 5  Difficult doing work/chores Not difficult at all Not difficult at all Not difficult at all    BP Readings from Last 3 Encounters:  05/07/22 128/76  02/28/22 (!) 107/54  01/02/22 118/64    Physical Exam Vitals and nursing note reviewed. Exam conducted with a chaperone present.  Constitutional:      General: She is not in acute distress.    Appearance: She is not diaphoretic.  HENT:     Head: Normocephalic and atraumatic.     Right Ear: External ear normal. A middle ear effusion is present.     Left Ear: Tympanic membrane and external ear normal.     Nose: Congestion present.     Mouth/Throat:     Mouth: Mucous membranes are moist.  Eyes:     General:        Right eye: No discharge.        Left eye: No discharge.     Extraocular Movements: Extraocular movements intact.     Conjunctiva/sclera: Conjunctivae normal.     Pupils: Pupils are equal, round, and reactive to light.  Neck:     Thyroid: No thyromegaly.     Vascular: No JVD.  Cardiovascular:     Rate and Rhythm: Normal rate and regular rhythm.     Heart sounds: Normal heart sounds. No murmur heard.    No friction rub. No gallop.  Pulmonary:     Effort: Pulmonary effort is normal.     Breath sounds: Normal breath sounds. No wheezing, rhonchi or rales.  Abdominal:     General: Bowel sounds are normal.     Palpations: Abdomen is soft. There is no mass.     Tenderness: There is no abdominal tenderness. There is no guarding.  Musculoskeletal:        General: Normal range of motion.  Cervical back: Normal range of motion and neck supple.  Lymphadenopathy:     Cervical: No cervical adenopathy.  Skin:    General: Skin is warm and dry.  Neurological:      Mental Status: She is alert.     Deep Tendon Reflexes: Reflexes are normal and symmetric.    Wt Readings from Last 3 Encounters:  05/07/22 155 lb (70.3 kg)  02/28/22 159 lb 8 oz (72.3 kg)  01/02/22 158 lb (71.7 kg)    BP 128/76   Pulse 66   Ht 5' 2.5" (1.588 m)   Wt 155 lb (70.3 kg)   SpO2 98%   BMI 27.90 kg/m   Assessment and Plan:  1. Non-recurrent acute serous otitis media of right ear New onset.  Persistent.  Stable.  Patient has noted a recent fullness in the ear and on examination a serous otitis is noted presumably of the eustachian tube dysfunction etiology.  Patient has been instructed to continue her Flonase and Singulair on a regular basis.  2. Type 2 diabetes mellitus without complication, without long-term current use of insulin (HCC) Chronic.  Controlled.  Stable.  Continue glipizide XL 2.5 mg daily as well as metformin XR 500 mg once a day.  Will check A1c for current level of control. - glipiZIDE (GLUCOTROL XL) 2.5 MG 24 hr tablet; TAKE 1 TABLET BY MOUTH DAILY WITH BREAKFAST.  Dispense: 90 tablet; Refill: 1 - metFORMIN (GLUCOPHAGE-XR) 500 MG 24 hr tablet; Take 1 tablet (500 mg total) by mouth daily.  Dispense: 90 tablet; Refill: 1 - HgB A1c     Otilio Miu, MD

## 2022-05-08 LAB — HEMOGLOBIN A1C
Est. average glucose Bld gHb Est-mCnc: 143 mg/dL
Hgb A1c MFr Bld: 6.6 % — ABNORMAL HIGH (ref 4.8–5.6)

## 2022-05-28 DIAGNOSIS — M2012 Hallux valgus (acquired), left foot: Secondary | ICD-10-CM | POA: Diagnosis not present

## 2022-05-29 DIAGNOSIS — M1711 Unilateral primary osteoarthritis, right knee: Secondary | ICD-10-CM | POA: Diagnosis not present

## 2022-06-18 DIAGNOSIS — M170 Bilateral primary osteoarthritis of knee: Secondary | ICD-10-CM | POA: Diagnosis not present

## 2022-08-20 DIAGNOSIS — M2012 Hallux valgus (acquired), left foot: Secondary | ICD-10-CM | POA: Diagnosis not present

## 2022-09-06 ENCOUNTER — Ambulatory Visit: Payer: BC Managed Care – PPO | Admitting: Family Medicine

## 2022-09-07 ENCOUNTER — Other Ambulatory Visit: Payer: Self-pay | Admitting: Family Medicine

## 2022-09-07 DIAGNOSIS — E039 Hypothyroidism, unspecified: Secondary | ICD-10-CM

## 2022-09-12 ENCOUNTER — Other Ambulatory Visit: Payer: Self-pay | Admitting: Family Medicine

## 2022-09-12 DIAGNOSIS — E782 Mixed hyperlipidemia: Secondary | ICD-10-CM

## 2022-09-12 NOTE — Telephone Encounter (Signed)
Requested Prescriptions  Pending Prescriptions Disp Refills   simvastatin (ZOCOR) 20 MG tablet [Pharmacy Med Name: SIMVASTATIN 20 MG TABLET] 90 tablet 1    Sig: TAKE 1 TABLET BY MOUTH EVERY DAY     Cardiovascular:  Antilipid - Statins Failed - 09/12/2022  2:25 AM      Failed - Lipid Panel in normal range within the last 12 months    Cholesterol, Total  Date Value Ref Range Status  01/02/2022 169 100 - 199 mg/dL Final   LDL Chol Calc (NIH)  Date Value Ref Range Status  01/02/2022 69 0 - 99 mg/dL Final   HDL  Date Value Ref Range Status  01/02/2022 89 >39 mg/dL Final   Triglycerides  Date Value Ref Range Status  01/02/2022 57 0 - 149 mg/dL Final         Passed - Patient is not pregnant      Passed - Valid encounter within last 12 months    Recent Outpatient Visits           4 months ago Type 2 diabetes mellitus without complication, without long-term current use of insulin (HCC)   [place] [facility], [facility]   8 months ago Essential hypertension   [place] [facility], MD   1 year ago Urticaria   [facility], [facility]   1 year ago Chronic maxillary sinusitis   [place] [facility], MD   1 year ago Essential hypertension   [place] [facility], [facility]

## 2022-09-19 DIAGNOSIS — M25561 Pain in right knee: Secondary | ICD-10-CM | POA: Diagnosis not present

## 2022-10-06 ENCOUNTER — Other Ambulatory Visit: Payer: Self-pay | Admitting: Family Medicine

## 2022-10-06 DIAGNOSIS — E039 Hypothyroidism, unspecified: Secondary | ICD-10-CM

## 2022-10-07 ENCOUNTER — Other Ambulatory Visit: Payer: Self-pay | Admitting: Family Medicine

## 2022-10-07 DIAGNOSIS — E039 Hypothyroidism, unspecified: Secondary | ICD-10-CM

## 2022-10-08 NOTE — Telephone Encounter (Signed)
Requested Prescriptions  Pending Prescriptions Disp Refills   levothyroxine (SYNTHROID) 50 MCG tablet [Pharmacy Med Name: LEVOTHYROXINE 50 MCG TABLET] 90 tablet 0    Sig: TAKE 1 TABLET BY MOUTH ON MONDAY, [facility] AND FRIDAY     Endocrinology:  Hypothyroid Agents Passed - 10/06/2022  1:18 AM      Passed - TSH in normal range and within 360 days    TSH  Date Value Ref Range Status  01/02/2022 1.050 0.450 - 4.500 uIU/mL Final         Passed - Valid encounter within last 12 months    Recent Outpatient Visits           5 months ago Type 2 diabetes mellitus without complication, without long-term current use of insulin (HCC)   [place] [facility], MD   9 months ago Essential hypertension   [place] [facility], [facility]   1 year ago Urticaria   [facility], MD   1 year ago Chronic maxillary sinusitis   [place] [facility], MD   1 year ago Essential hypertension    [facility], [facility]

## 2022-10-12 ENCOUNTER — Other Ambulatory Visit: Payer: Self-pay | Admitting: Family Medicine

## 2022-10-12 DIAGNOSIS — I10 Essential (primary) hypertension: Secondary | ICD-10-CM

## 2022-10-12 NOTE — Telephone Encounter (Signed)
Requested Prescriptions  Pending Prescriptions Disp Refills   hydrochlorothiazide (HYDRODIURIL) 25 MG tablet [Pharmacy Med Name: HYDROCHLOROTHIAZIDE 25 MG TAB] 90 tablet 0    Sig: TAKE 1 TABLET (25 MG TOTAL) BY MOUTH DAILY.     Cardiovascular: Diuretics - Thiazide Failed - 10/12/2022  2:46 AM      Failed - Cr in normal range and within 180 days    Creatinine, Ser  Date Value Ref Range Status  01/02/2022 0.69 0.57 - 1.00 mg/dL Final         Failed - K in normal range and within 180 days    Potassium  Date Value Ref Range Status  02/28/2022 3.4 (L) 3.5 - 5.1 mmol/L Final    Comment:    Performed at [facility], [address] [place], [place] [address]         Failed - Na in normal range and within 180 days    Sodium  Date Value Ref Range Status  01/02/2022 141 134 - 144 mmol/L Final         Passed - Last BP in normal range    BP Readings from Last 1 Encounters:  05/07/22 128/76         Passed - Valid encounter within last 6 months    Recent Outpatient Visits           5 months ago Type 2 diabetes mellitus without complication, without long-term current use of insulin (HCC)   [place] [facility], MD   9 months ago Essential hypertension   [place] [facility], MD   1 year ago Urticaria   [facility], [place]   1 year ago Chronic maxillary sinusitis   [place] [facility], MD   1 year ago Essential hypertension   [place] [facility], MD

## 2022-11-05 ENCOUNTER — Other Ambulatory Visit: Payer: Self-pay | Admitting: Family Medicine

## 2022-11-05 DIAGNOSIS — E039 Hypothyroidism, unspecified: Secondary | ICD-10-CM

## 2022-11-10 ENCOUNTER — Other Ambulatory Visit: Payer: Self-pay | Admitting: Family Medicine

## 2022-11-10 DIAGNOSIS — E119 Type 2 diabetes mellitus without complications: Secondary | ICD-10-CM

## 2022-11-13 NOTE — Telephone Encounter (Signed)
Attempted tp call patient to schedule 6 mon f/u- no answer- no VM Requested Prescriptions  Pending Prescriptions Disp Refills   glipiZIDE (GLUCOTROL XL) 2.5 MG 24 hr tablet [Pharmacy Med Name: GLIPIZIDE ER 2.5 MG TABLET] 30 tablet 5    Sig: TAKE 1 TABLET BY MOUTH EVERY DAY WITH BREAKFAST     Endocrinology:  Diabetes - Sulfonylureas Failed - 11/10/2022  1:13 AM      Failed - HBA1C is between 0 and 7.9 and within 180 days    Hgb A1c MFr Bld  Date Value Ref Range Status  05/07/2022 6.6 (H) 4.8 - 5.6 % Final    Comment:             Prediabetes: 5.7 - 6.4          Diabetes: >6.4          Glycemic control for adults with diabetes: <7.0          Failed - Valid encounter within last 6 months    Recent Outpatient Visits           6 months ago Type 2 diabetes mellitus without complication, without long-term current use of insulin (HCC)   [place] [facility], MD   10 months ago Essential hypertension   [place] [facility], MD   1 year ago Urticaria   [place] [facility], MD   1 year ago Chronic maxillary sinusitis   [place] [facility], MD   1 year ago Essential hypertension   [place] [facility], MD              Passed - Cr in normal range and within 360 days    Creatinine, Ser  Date Value Ref Range Status  01/02/2022 0.69 0.57 - 1.00 mg/dL Final          metFORMIN (GLUCOPHAGE-XR) 500 MG 24 hr tablet [Pharmacy Med Name: METFORMIN HCL ER 500 MG TABLET] 30 tablet 5    Sig: TAKE 1 TABLET (500 MG TOTAL) BY MOUTH DAILY.     Endocrinology:  Diabetes - Biguanides Failed - 11/10/2022  1:13 AM      Failed - HBA1C is between 0 and 7.9 and within 180 days    Hgb A1c MFr Bld  Date Value Ref Range  Status  05/07/2022 6.6 (H) 4.8 - 5.6 % Final    Comment:             Prediabetes: 5.7 - 6.4          Diabetes: >6.4          Glycemic control for adults with diabetes: <7.0          Failed - B12 Level in normal range and within 720 days    No results found for: "VITAMINB12"       Failed - Valid encounter within last 6 months    Recent Outpatient Visits           6 months ago Type 2 diabetes mellitus without complication, without long-term current use of insulin (HCC)   [place] [facility], MD   10 months ago Essential hypertension    [facility], MD   1 year ago Urticaria   [place] [facility], MD   1 year ago Chronic maxillary sinusitis   [place] [facility], MD   1 year ago Essential hypertension   [place] [facility], MD              Failed - CBC within normal limits and completed in the last 12 months    WBC  Date Value Ref Range Status  04/14/2020 5.0 3.4 - 10.8 x10E3/uL Final  06/19/2017 5.3 3.6 - 11.0 K/uL Final   RBC  Date Value Ref Range Status  04/14/2020 4.12 3.77 - 5.28 x10E6/uL Final  06/19/2017 3.90 3.80 - 5.20 MIL/uL Final   Hemoglobin  Date Value Ref Range Status  04/14/2020 11.3 11.1 - 15.9 g/dL Final   Hematocrit  Date Value Ref Range Status  04/14/2020 35.3 34.0 - 46.6 % Final   MCHC  Date Value Ref Range Status  04/14/2020 32.0 31.5 - 35.7 g/dL Final  62/95/2841 32.4 32.0 - 36.0 g/dL Final   [facility]  Date Value Ref Range Status  04/14/2020 27.4 26.6 - 33.0 pg Final  06/19/2017 31.7 26.0 - 34.0 pg Final   MCV  Date Value Ref Range Status  04/14/2020 86 79 - 97 fL Final   No results found for: "PLTCOUNTKUC", "LABPLAT", "POCPLA" RDW  Date  Value Ref Range Status  04/14/2020 14.3 11.7 - 15.4 % Final         Passed - Cr in normal range and within 360 days    Creatinine, Ser  Date Value Ref Range Status  01/02/2022 0.69 0.57 - 1.00 mg/dL Final         Passed - eGFR in normal range and within 360 days    GFR calc Af Amer  Date Value Ref Range Status  04/14/2020 111 >59 mL/min/1.73 Final    Comment:    **In accordance with recommendations from the NKF-ASN Task force,**   [facility] is in the process of updating its eGFR calculation to the   2021 CKD-EPI creatinine equation that estimates kidney function   without a race variable.    GFR calc non Af Amer  Date Value Ref Range Status  04/14/2020 96 >59 mL/min/1.73 Final   eGFR  Date Value Ref Range Status  01/02/2022 97 >59 mL/min/1.73 Final

## 2022-11-21 ENCOUNTER — Other Ambulatory Visit: Payer: Self-pay | Admitting: Family Medicine

## 2022-11-21 DIAGNOSIS — E119 Type 2 diabetes mellitus without complications: Secondary | ICD-10-CM

## 2022-11-22 ENCOUNTER — Encounter: Payer: Self-pay | Admitting: *Deleted

## 2022-11-22 NOTE — Telephone Encounter (Signed)
Requested medication (s) are due for refill today:   Yes   A 30 day courtesy supply was given on 11/13/2022.   Requested medication (s) are on the active medication list:   Yes  Future visit scheduled:   No  She cancelled her appt on 09/06/2022.   Last ordered: 11/13/2022 #30, 0 refills as a courtesy supply.   On 9/3 an attempt was made to call her to make the appt but there wasn't an answer and no voicemail. I have sent her a MyChart message asking her to call and make her 6 month check appt.      Requested Prescriptions  Pending Prescriptions Disp Refills   metFORMIN (GLUCOPHAGE-XR) 500 MG 24 hr tablet [Pharmacy Med Name: METFORMIN HCL ER 500 MG TABLET] 30 tablet 0    Sig: TAKE 1 TABLET (500 MG TOTAL) BY MOUTH DAILY.     Endocrinology:  Diabetes - Biguanides Failed - 11/21/2022  1:42 PM      Failed - HBA1C is between 0 and 7.9 and within 180 days    Hgb A1c MFr Bld  Date Value Ref Range Status  05/07/2022 6.6 (H) 4.8 - 5.6 % Final    Comment:             Prediabetes: 5.7 - 6.4          Diabetes: >6.4          Glycemic control for adults with diabetes: <7.0          Failed - B12 Level in normal range and within 720 days    No results found for: "VITAMINB12"       Failed - Valid encounter within last 6 months    Recent Outpatient Visits           6 months ago Type 2 diabetes mellitus without complication, without long-term current use of insulin (HCC)   [place] [facility], [facility]   10 months ago Essential hypertension   [place] [facility], [facility]   1 year ago Urticaria   [place] [facility], [facility]   1 year ago Chronic maxillary sinusitis   [place] [facility], [facility]   1 year ago Essential hypertension    [facility] at [facility], [facility]              Failed - CBC within normal limits and completed in the last 12 months    WBC  Date Value Ref Range Status  04/14/2020 5.0 3.4 - 10.8 x10E3/uL Final  06/19/2017 5.3 3.6 - 11.0 K/uL Final   RBC  Date Value Ref Range Status  04/14/2020 4.12 3.77 - 5.28 x10E6/uL Final  06/19/2017 3.90 3.80 - 5.20 MIL/uL Final   Hemoglobin  Date Value Ref Range Status  04/14/2020 11.3 11.1 - 15.9 g/dL Final   Hematocrit  Date Value Ref Range Status  04/14/2020 35.3 34.0 - 46.6 % Final   MCHC  Date Value Ref Range Status  04/14/2020 32.0 31.5 - 35.7 g/dL Final  91/47/8295 62.1 32.0 - 36.0 g/dL Final   [facility]  Date Value Ref Range Status  04/14/2020 27.4 26.6 - 33.0 pg Final  06/19/2017 31.7 26.0 - 34.0 pg Final   MCV  Date Value  Ref Range Status  04/14/2020 86 79 - 97 fL Final   No results found for: "PLTCOUNTKUC", "LABPLAT", "POCPLA" RDW  Date Value Ref Range Status  04/14/2020 14.3 11.7 - 15.4 % Final         Passed - Cr in normal range and within 360 days    Creatinine, Ser  Date Value Ref Range Status  01/02/2022 0.69 0.57 - 1.00 mg/dL Final         Passed - eGFR in normal range and within 360 days    GFR calc Af Amer  Date Value Ref Range Status  04/14/2020 111 >59 mL/min/1.73 Final    Comment:    **In accordance with recommendations from the NKF-ASN Task force,**   [facility] is in the process of updating its eGFR calculation to the   2021 CKD-EPI creatinine equation that estimates kidney function   without a race variable.    GFR calc non Af Amer  Date Value Ref Range Status  04/14/2020 96 >59 mL/min/1.73 Final   eGFR  Date Value Ref Range Status  01/02/2022 97 >59 mL/min/1.73 Final

## 2022-11-25 ENCOUNTER — Other Ambulatory Visit: Payer: Self-pay | Admitting: Family Medicine

## 2022-11-25 DIAGNOSIS — E119 Type 2 diabetes mellitus without complications: Secondary | ICD-10-CM

## 2022-11-25 DIAGNOSIS — E039 Hypothyroidism, unspecified: Secondary | ICD-10-CM

## 2022-11-27 NOTE — Telephone Encounter (Signed)
Requests are too soon for review, OV needed for additional refills.  Requested Prescriptions  Pending Prescriptions Disp Refills   levothyroxine (SYNTHROID) 75 MCG tablet [Pharmacy Med Name: LEVOTHYROXINE 75 MCG TABLET] 48 tablet 3    Sig: TAKE 1 TABLET BY MOUTH EVERY TUE, THU, SAT, AND SUNDAY     Endocrinology:  Hypothyroid Agents Passed - 11/25/2022  7:41 PM      Passed - TSH in normal range and within 360 days    TSH  Date Value Ref Range Status  01/02/2022 1.050 0.450 - 4.500 uIU/mL Final         Passed - Valid encounter within last 12 months    Recent Outpatient Visits           6 months ago Type 2 diabetes mellitus without complication, without long-term current use of insulin (HCC)   [place] [facility]   10 months ago Essential hypertension   [place] [facility], [facility]   1 year ago Urticaria   [place] [facility], MD   1 year ago Chronic maxillary sinusitis   [place] [facility], [facility]   1 year ago Essential hypertension   [place] [facility], MD               glipiZIDE (GLUCOTROL XL) 2.5 MG 24 hr tablet [Pharmacy Med Name: GLIPIZIDE ER 2.5 MG TABLET] 30 tablet 0    Sig: TAKE 1 TABLET BY MOUTH EVERY DAY WITH BREAKFAST     Endocrinology:  Diabetes - Sulfonylureas Failed - 11/25/2022  7:41 PM      Failed - HBA1C is between 0 and 7.9 and within 180 days    Hgb A1c MFr Bld  Date Value Ref Range Status  05/07/2022 6.6 (H) 4.8 - 5.6 % Final    Comment:             Prediabetes: 5.7 - 6.4          Diabetes: >6.4          Glycemic control for adults with diabetes: <7.0          Failed - Valid encounter within last 6 months    Recent Outpatient Visits           6 months ago  Type 2 diabetes mellitus without complication, without long-term current use of insulin (HCC)   [place] [facility]   10 months ago Essential hypertension   [place] [facility], [facility]   1 year ago Urticaria   [place] [facility], MD   1 year ago Chronic maxillary sinusitis   [place] [facility], MD   1 year ago Essential hypertension   [place] [facility] at [facility], MD              Passed - Cr in normal range and within 360 days    Creatinine, Ser  Date Value Ref Range Status  01/02/2022 0.69 0.57 - 1.00 mg/dL  Final

## 2022-12-10 DIAGNOSIS — E119 Type 2 diabetes mellitus without complications: Secondary | ICD-10-CM | POA: Diagnosis not present

## 2022-12-10 DIAGNOSIS — H26492 Other secondary cataract, left eye: Secondary | ICD-10-CM | POA: Diagnosis not present

## 2022-12-10 DIAGNOSIS — Z961 Presence of intraocular lens: Secondary | ICD-10-CM | POA: Diagnosis not present

## 2022-12-10 DIAGNOSIS — H5203 Hypermetropia, bilateral: Secondary | ICD-10-CM | POA: Diagnosis not present

## 2022-12-10 DIAGNOSIS — H04123 Dry eye syndrome of bilateral lacrimal glands: Secondary | ICD-10-CM | POA: Diagnosis not present

## 2022-12-10 DIAGNOSIS — H52223 Regular astigmatism, bilateral: Secondary | ICD-10-CM | POA: Diagnosis not present

## 2022-12-10 LAB — HM DIABETES EYE EXAM

## 2022-12-17 DIAGNOSIS — M1711 Unilateral primary osteoarthritis, right knee: Secondary | ICD-10-CM | POA: Diagnosis not present

## 2022-12-19 DIAGNOSIS — M25541 Pain in joints of right hand: Secondary | ICD-10-CM | POA: Diagnosis not present

## 2022-12-19 DIAGNOSIS — G5603 Carpal tunnel syndrome, bilateral upper limbs: Secondary | ICD-10-CM | POA: Diagnosis not present

## 2022-12-19 DIAGNOSIS — M180 Bilateral primary osteoarthritis of first carpometacarpal joints: Secondary | ICD-10-CM | POA: Diagnosis not present

## 2022-12-19 DIAGNOSIS — M25542 Pain in joints of left hand: Secondary | ICD-10-CM | POA: Diagnosis not present

## 2023-01-04 ENCOUNTER — Other Ambulatory Visit: Payer: Self-pay | Admitting: Family Medicine

## 2023-01-04 DIAGNOSIS — F3341 Major depressive disorder, recurrent, in partial remission: Secondary | ICD-10-CM

## 2023-01-04 DIAGNOSIS — E119 Type 2 diabetes mellitus without complications: Secondary | ICD-10-CM

## 2023-01-04 NOTE — Telephone Encounter (Signed)
Requested Prescriptions  Pending Prescriptions Disp Refills   glipiZIDE (GLUCOTROL XL) 2.5 MG 24 hr tablet [Pharmacy Med Name: GLIPIZIDE ER 2.5 MG TABLET] 30 tablet 0    Sig: TAKE 1 TABLET BY MOUTH EVERY DAY WITH BREAKFAST     Endocrinology:  Diabetes - Sulfonylureas Failed - 01/04/2023  8:09 AM      Failed - HBA1C is between 0 and 7.9 and within 180 days    Hgb A1c MFr Bld  Date Value Ref Range Status  05/07/2022 6.6 (H) 4.8 - 5.6 % Final    Comment:             Prediabetes: 5.7 - 6.4          Diabetes: >6.4          Glycemic control for adults with diabetes: <7.0          Failed - Cr in normal range and within 360 days    Creatinine, Ser  Date Value Ref Range Status  01/02/2022 0.69 0.57 - 1.00 mg/dL Final         Failed - Valid encounter within last 6 months    Recent Outpatient Visits           8 months ago Type 2 diabetes mellitus without complication, without long-term current use of insulin (HCC)   [place] [facility], MD   1 year ago Essential hypertension   [place] [facility], MD   1 year ago Urticaria   [place] [facility], MD   1 year ago Chronic maxillary sinusitis   [place] [facility], MD   1 year ago Essential hypertension   [place] [facility], MD       Future Appointments             In 2 weeks Duanne Limerick, MD [facility]             metFORMIN (GLUCOPHAGE-XR) 500 MG 24 hr tablet [Pharmacy Med Name: METFORMIN HCL ER 500 MG TABLET] 30 tablet 0    Sig: TAKE 1 TABLET (500 MG TOTAL) BY MOUTH DAILY.     Endocrinology:  Diabetes - Biguanides Failed - 01/04/2023  8:09 AM      Failed - Cr in normal range  and within 360 days    Creatinine, Ser  Date Value Ref Range Status  01/02/2022 0.69 0.57 - 1.00 mg/dL Final         Failed - HBA1C is between 0 and 7.9 and within 180 days    Hgb A1c MFr Bld  Date Value Ref Range Status  05/07/2022 6.6 (H) 4.8 - 5.6 % Final    Comment:             Prediabetes: 5.7 - 6.4          Diabetes: >6.4          Glycemic control for adults with diabetes: <7.0          Failed - eGFR in normal range and within 360 days    GFR calc Af Amer  Date Value Ref Range Status  04/14/2020 111 >59 mL/min/1.73 Final    Comment:    **  In accordance with recommendations from the NKF-ASN Task force,**   [facility] is in the process of updating its eGFR calculation to the   2021 CKD-EPI creatinine equation that estimates kidney function   without a race variable.    GFR calc non Af Amer  Date Value Ref Range Status  04/14/2020 96 >59 mL/min/1.73 Final   eGFR  Date Value Ref Range Status  01/02/2022 97 >59 mL/min/1.73 Final         Failed - B12 Level in normal range and within 720 days    No results found for: "VITAMINB12"       Failed - Valid encounter within last 6 months    Recent Outpatient Visits           8 months ago Type 2 diabetes mellitus without complication, without long-term current use of insulin (HCC)   [place] [facility] MD   1 year ago Essential hypertension   [place] [facility], MD   1 year ago Urticaria   [place] [facility], MD   1 year ago Chronic maxillary sinusitis   [place] [facility], MD   1 year ago Essential hypertension   [place] [facility], MD       Future Appointments             In 2 weeks Duanne Limerick, MD [facility] at [facility]            Failed - CBC within normal limits and completed in the last 12 months    WBC  Date Value Ref Range Status  04/14/2020 5.0 3.4 - 10.8 x10E3/uL Final  06/19/2017 5.3 3.6 - 11.0 K/uL Final   RBC  Date Value Ref Range Status  04/14/2020 4.12 3.77 - 5.28 x10E6/uL Final  06/19/2017 3.90 3.80 - 5.20 MIL/uL Final   Hemoglobin  Date Value Ref Range Status  04/14/2020 11.3 11.1 - 15.9 g/dL Final   Hematocrit  Date Value Ref Range Status  04/14/2020 35.3 34.0 - 46.6 % Final   MCHC  Date Value Ref Range Status  04/14/2020 32.0 31.5 - 35.7 g/dL Final  62/13/0865 78.4 32.0 - 36.0 g/dL Final   [facility]  Date Value Ref Range Status  04/14/2020 27.4 26.6 - 33.0 pg Final  06/19/2017 31.7 26.0 - 34.0 pg Final   MCV  Date Value Ref Range Status  04/14/2020 86 79 - 97 fL Final   No results found for: "PLTCOUNTKUC", "LABPLAT", "POCPLA" RDW  Date Value Ref Range Status  04/14/2020 14.3 11.7 - 15.4 % Final          citalopram (CELEXA) 20 MG tablet [Pharmacy Med Name: CITALOPRAM HBR 20 MG TABLET] 90 tablet 1    Sig: TAKE 1 TABLET (20 MG TOTAL) BY MOUTH IN THE MORNING AND AT BEDTIME     Psychiatry:  Antidepressants - SSRI Failed - 01/04/2023  8:09 AM      Failed - Valid encounter within last 6 months    Recent Outpatient Visits           8 months ago Type 2 diabetes mellitus without complication, without long-term current use of insulin (HCC)   [facility], [facility]   1 year ago Essential hypertension   [place] [facility], MD   1 year ago Urticaria   [facility], [facility]   1 year ago Chronic maxillary sinusitis   [place] [facility], [facility]   1 year ago Essential hypertension   [place]  [facility], MD       Future Appointments             In 2 weeks Duanne Limerick, MD [facility]            Passed - Completed PHQ-2 or PHQ-9 in the last 360 days

## 2023-01-15 DIAGNOSIS — R200 Anesthesia of skin: Secondary | ICD-10-CM | POA: Diagnosis not present

## 2023-01-21 ENCOUNTER — Ambulatory Visit (INDEPENDENT_AMBULATORY_CARE_PROVIDER_SITE_OTHER): Payer: BC Managed Care – PPO | Admitting: Family Medicine

## 2023-01-21 ENCOUNTER — Encounter: Payer: Self-pay | Admitting: Family Medicine

## 2023-01-21 VITALS — BP 112/78 | HR 76 | Ht 62.5 in | Wt 143.0 lb

## 2023-01-21 DIAGNOSIS — Z7984 Long term (current) use of oral hypoglycemic drugs: Secondary | ICD-10-CM

## 2023-01-21 DIAGNOSIS — R634 Abnormal weight loss: Secondary | ICD-10-CM

## 2023-01-21 DIAGNOSIS — I10 Essential (primary) hypertension: Secondary | ICD-10-CM | POA: Diagnosis not present

## 2023-01-21 DIAGNOSIS — E119 Type 2 diabetes mellitus without complications: Secondary | ICD-10-CM | POA: Diagnosis not present

## 2023-01-21 DIAGNOSIS — J4520 Mild intermittent asthma, uncomplicated: Secondary | ICD-10-CM | POA: Diagnosis not present

## 2023-01-21 DIAGNOSIS — E039 Hypothyroidism, unspecified: Secondary | ICD-10-CM

## 2023-01-21 DIAGNOSIS — E782 Mixed hyperlipidemia: Secondary | ICD-10-CM

## 2023-01-21 DIAGNOSIS — D649 Anemia, unspecified: Secondary | ICD-10-CM | POA: Diagnosis not present

## 2023-01-21 MED ORDER — MONTELUKAST SODIUM 10 MG PO TABS: ORAL_TABLET | ORAL | 0 refills | Status: DC

## 2023-01-21 MED ORDER — METFORMIN HCL ER 500 MG PO TB24: 500.0000 mg | ORAL_TABLET | Freq: Every day | ORAL | 1 refills | Status: DC

## 2023-01-21 MED ORDER — HYDROCHLOROTHIAZIDE 25 MG PO TABS: 25.0000 mg | ORAL_TABLET | Freq: Every day | ORAL | 0 refills | Status: DC

## 2023-01-21 MED ORDER — LEVOTHYROXINE SODIUM 50 MCG PO TABS: ORAL_TABLET | ORAL | 0 refills | Status: DC

## 2023-01-21 MED ORDER — LEVOTHYROXINE SODIUM 75 MCG PO TABS: ORAL_TABLET | ORAL | 0 refills | Status: DC

## 2023-01-21 MED ORDER — GLIPIZIDE ER 2.5 MG PO TB24: ORAL_TABLET | ORAL | 0 refills | Status: DC

## 2023-01-21 MED ORDER — MONTELUKAST SODIUM 10 MG PO TABS: ORAL_TABLET | ORAL | 1 refills | Status: DC

## 2023-01-21 MED ORDER — LEVOTHYROXINE SODIUM 50 MCG PO TABS: ORAL_TABLET | ORAL | 1 refills | Status: AC

## 2023-01-21 MED ORDER — METFORMIN HCL ER 500 MG PO TB24: 500.0000 mg | ORAL_TABLET | Freq: Every day | ORAL | 0 refills | Status: DC

## 2023-01-21 MED ORDER — GLIPIZIDE ER 2.5 MG PO TB24: ORAL_TABLET | ORAL | 1 refills | Status: DC

## 2023-01-21 MED ORDER — HYDROCHLOROTHIAZIDE 25 MG PO TABS: 25.0000 mg | ORAL_TABLET | Freq: Every day | ORAL | 1 refills | Status: DC

## 2023-01-21 MED ORDER — LEVOTHYROXINE SODIUM 75 MCG PO TABS: ORAL_TABLET | ORAL | 1 refills | Status: AC

## 2023-01-21 NOTE — Progress Notes (Signed)
Date:  01/21/2023   Name:  Phyllis Wagner   DOB:  20-Jun-1955   MRN:  [id number]   Chief Complaint: Diabetes, Hypertension, Hyperlipidemia, and Hypothyroidism  Diabetes She presents for her follow-up diabetic visit. She has type 2 diabetes mellitus. Her disease course has been stable. There are no hypoglycemic associated symptoms. Pertinent negatives for hypoglycemia include no headaches, nervousness/anxiousness, speech difficulty, sweats or tremors. Associated symptoms include fatigue and weight loss. Pertinent negatives for diabetes include no blurred vision, no chest pain and no visual change. There are no hypoglycemic complications. Symptoms are stable. There are no diabetic complications. Pertinent negatives for diabetic complications include no CVA. There are no known risk factors for coronary artery disease. Current diabetic treatment includes oral agent (dual therapy). She is compliant with treatment some of the time. Her weight is increasing rapidly. She is following a generally healthy diet. Meal planning includes avoidance of concentrated sweets and carbohydrate counting. She participates in exercise intermittently. Her home blood glucose trend is increasing rapidly. An ACE inhibitor/angiotensin II receptor blocker is being taken.  Hypertension This is a chronic problem. The current episode started more than 1 year ago. The problem has been gradually improving since onset. The problem is controlled. Pertinent negatives include no anxiety, blurred vision, chest pain, headaches, malaise/fatigue, neck pain, orthopnea, palpitations, peripheral edema, PND, shortness of breath or sweats. There are no associated agents to hypertension. There are no known risk factors for coronary artery disease. Past treatments include diuretics. The current treatment provides moderate improvement. There are no compliance problems.  There is no history of angina, CAD/MI or CVA. Identifiable causes of  hypertension include a thyroid problem. There is no history of chronic renal disease, a hypertension causing med or renovascular disease.  Hyperlipidemia This is a chronic problem. The current episode started more than 1 year ago. The problem is controlled. Recent lipid tests were reviewed and are normal. She has no history of chronic renal disease, diabetes or liver disease. Pertinent negatives include no chest pain or shortness of breath. Current antihyperlipidemic treatment includes statins. The current treatment provides moderate improvement of lipids. There are no compliance problems.  Risk factors for coronary artery disease include dyslipidemia.  Thyroid Problem Presents for follow-up visit. Symptoms include cold intolerance, fatigue and weight loss. Patient reports no anxiety, constipation, depressed mood, diaphoresis, diarrhea, dry skin, hair loss, heat intolerance, hoarse voice, leg swelling, menstrual problem, nail problem, palpitations, tremors, visual change or weight gain. Her past medical history is significant for hyperlipidemia. There is no history of diabetes.    Lab Results  Component Value Date   NA 141 01/02/2022   K 3.4 (L) 02/28/2022   CO2 25 01/02/2022   GLUCOSE 109 (H) 01/02/2022   BUN 9 01/02/2022   CREATININE 0.69 01/02/2022   CALCIUM 9.7 01/02/2022   EGFR 97 01/02/2022   GFRNONAA 96 04/14/2020   Lab Results  Component Value Date   CHOL 169 01/02/2022   HDL 89 01/02/2022   LDLCALC 69 01/02/2022   TRIG 57 01/02/2022   CHOLHDL 2.5 02/04/2018   Lab Results  Component Value Date   TSH 1.050 01/02/2022   Lab Results  Component Value Date   HGBA1C 6.6 (H) 05/07/2022   Lab Results  Component Value Date   WBC 5.0 04/14/2020   HGB 11.3 04/14/2020   HCT 35.3 04/14/2020   MCV 86 04/14/2020   PLT 296 04/14/2020   Lab Results  Component Value Date   ALT 12 04/06/2021  AST 22 04/06/2021   ALKPHOS 78 04/06/2021   BILITOT 0.3 04/06/2021   No results found  for: "25OHVITD2", "25OHVITD3", "VD25OH"   Review of Systems  Constitutional:  Positive for fatigue and weight loss. Negative for diaphoresis, malaise/fatigue, unexpected weight change and weight gain.  HENT:  Negative for hoarse voice.   Eyes:  Negative for blurred vision and visual disturbance.  Respiratory:  Negative for chest tightness, shortness of breath and wheezing.   Cardiovascular:  Negative for chest pain, palpitations, orthopnea and PND.  Gastrointestinal:  Negative for abdominal pain, constipation and diarrhea.  Endocrine: Positive for cold intolerance. Negative for heat intolerance.  Genitourinary:  Negative for menstrual problem.  Musculoskeletal:  Negative for neck pain.  Neurological:  Negative for tremors, speech difficulty, numbness and headaches.  Psychiatric/Behavioral:  The patient is not nervous/anxious.     Patient Active Problem List   Diagnosis Date Noted   Calculus of gallbladder without cholecystitis without obstruction 06/12/2017   Chronic maxillary sinusitis 03/15/2015   Chronic rhinitis 03/15/2015   Mild intermittent asthma without complication 03/15/2015   Oroantral fistula 03/15/2015   Essential hypertension 01/05/2015   Thyroid activity decreased 01/05/2015   Hyperlipidemia 01/05/2015   Recurrent major depressive disorder, in partial remission (HCC) 01/05/2015    Allergies  Allergen Reactions   Aspirin Anaphylaxis, Hives and Shortness Of Breath   Biaxin [Clarithromycin] Anaphylaxis, Hives and Shortness Of Breath   Nsaids Hives and Swelling   Penicillins Anaphylaxis, Hives and Swelling    Has patient had a PCN reaction causing immediate rash, facial/tongue/throat swelling, SOB or lightheadedness with hypotension:Yes Has patient had a PCN reaction causing severe rash involving mucus membranes or skin necrosis: Unknown Has patient had a PCN reaction that required hospitalization: Unknown Has patient had a PCN reaction occurring within the last 10  years: No Childhood reaction. If all of the above answers are "NO", then may proceed with Cephalosporin use.    Sulfa Antibiotics Hives   Ancef [Cefazolin] Rash    Past Surgical History:  Procedure Laterality Date   BUNIONECTOMY Right 07/19/2021   Procedure: BUNIONECTOMY - LAPIDUS-TYPE;  Surgeon: Gwyneth Revels, DPM;  Location: [facility];  Service: Podiatry;  Laterality: Right;   BUNIONECTOMY Left 02/28/2022   Procedure: BUNIONECTOMY - LAPIDUS TYPE;  Surgeon: Gwyneth Revels, DPM;  Location: [facility];  Service: Podiatry;  Laterality: Left;  GENERAL WITH LOCAL   CHOLECYSTECTOMY N/A 06/21/2017   Procedure: LAPAROSCOPIC CHOLECYSTECTOMY WITH INTRAOPERATIVE CHOLANGIOGRAM;  Surgeon: Earline Mayotte, MD;  Location: [facility];  Service: General;  Laterality: N/A;   COLONOSCOPY  2012   cleared for 10 yrs- [place] [facility]   EYE SURGERY Bilateral 2018   FOOT ARTHRODESIS Right 07/19/2021   Procedure: ARTHRODESIS FOOT; LISFRANC; MULTIPLE;  Surgeon: Gwyneth Revels, DPM;  Location: [facility];  Service: Podiatry;  Laterality: Right;  Diabetic   GASTRIC BYPASS  2013   Dr Ethelene Hal at [facility]   KNEE SURGERY Left    LYMPH NODE DISSECTION     TONSILLECTOMY     UMBILICAL HERNIA REPAIR  06/21/2017   Procedure: HERNIA REPAIR UMBILICAL ADULT;  Surgeon: Earline Mayotte, MD;  Location: [facility];  Service: General;;   VAGINAL HYSTERECTOMY     ovaries remain    Social History   Tobacco Use   Smoking status: Former    Current packs/day: 0.00    Average packs/day: 1 pack/day for 20.0 years (20.0 ttl pk-yrs)    Types: Cigarettes    Start date: 06/18/1990  Quit date: 06/18/2010    Years since quitting: 12.6   Smokeless tobacco: Never  Vaping Use   Vaping status: Never Used  Substance Use Topics   Alcohol use: Not Currently    Alcohol/week: 0.0 standard drinks of alcohol   Drug use: No     Medication list has been reviewed and updated.  Current Meds  Medication Sig    albuterol (VENTOLIN HFA) 108 (90 Base) MCG/ACT inhaler TAKE 2 PUFFS BY MOUTH EVERY 6 HOURS AS NEEDED FOR WHEEZE OR SHORTNESS OF BREATH   cholecalciferol (VITAMIN D) 1000 UNITS tablet Take 1,000 Units by mouth daily.   citalopram (CELEXA) 20 MG tablet TAKE 1 TABLET (20 MG TOTAL) BY MOUTH IN THE MORNING AND AT BEDTIME   EPINEPHrine 0.3 mg/0.3 mL IJ SOAJ injection Inject 0.3 mg into the muscle as needed for anaphylaxis.   fluticasone (FLONASE) 50 MCG/ACT nasal spray SPRAY 2 SPRAYS INTO EACH NOSTRIL EVERY DAY   gabapentin (NEURONTIN) 300 MG capsule Take 300 mg by mouth at bedtime.   glipiZIDE (GLUCOTROL XL) 2.5 MG 24 hr tablet TAKE 1 TABLET BY MOUTH EVERY DAY WITH BREAKFAST   Glucosamine-Chondroitin (COSAMIN DS PO) Take 2 tablets by mouth daily.   hydrochlorothiazide (HYDRODIURIL) 25 MG tablet TAKE 1 TABLET (25 MG TOTAL) BY MOUTH DAILY.   levothyroxine (SYNTHROID) 50 MCG tablet TAKE 1 TABLET BY MOUTH ON MONDAY, WEDNESDAY AND FRIDAY   levothyroxine (SYNTHROID) 75 MCG tablet TAKE 1 TABLET BY MOUTH EVERY T, TH, SAT, AND SUNDAY   metFORMIN (GLUCOPHAGE-XR) 500 MG 24 hr tablet TAKE 1 TABLET (500 MG TOTAL) BY MOUTH DAILY.   montelukast (SINGULAIR) 10 MG tablet TAKE 1 TABLET BY MOUTH EVERYDAY AT BEDTIME   Omega-3 Fatty Acids (FISH OIL) 1000 MG CAPS Take 1 capsule (1,000 mg total) by mouth daily.   Polyethyl Glycol-Propyl Glycol 0.4-0.3 % SOLN Place 1 drop into both eyes 3 (three) times daily as needed (for dry eyes.).   potassium chloride (MICRO-K) 10 MEQ CR capsule Take 10 mEq by mouth daily.   simvastatin (ZOCOR) 20 MG tablet TAKE 1 TABLET BY MOUTH EVERY DAY   traMADol (ULTRAM) 50 MG tablet Take 1 tablet (50 mg total) by mouth every 6 (six) hours as needed.       01/21/2023    1:37 PM 05/07/2022    9:31 AM 01/02/2022    8:14 AM 08/15/2021    8:17 AM  GAD 7 : Generalized Anxiety Score  Nervous, Anxious, on Edge 0 0 0 0  Control/stop worrying 0 0 3 0  Worry too much - different things 0 0 3 0  Trouble  relaxing 0 0 0 0  Restless 0 0 0 0  Easily annoyed or irritable 0 0 3 0  Afraid - awful might happen 0 0 0 0  Total GAD 7 Score 0 0 9 0  Anxiety Difficulty Not difficult at all Not difficult at all Somewhat difficult Not difficult at all       11 /01/2023    1:37 PM 05/07/2022    9:30 AM 01/02/2022    8:13 AM  Depression screen PHQ 2/9  Decreased Interest 0 0 1  Down, Depressed, Hopeless 0 0 0  PHQ - 2 Score 0 0 1  Altered sleeping 3 0 3  Tired, decreased energy 3 0 3  Change in appetite 0 0 2  Feeling bad or failure about yourself  0 0 0  Trouble concentrating 0 0 0  Moving slowly or fidgety/restless 0 0  0  Suicidal thoughts 0 0 0  PHQ-9 Score 6 0 9  Difficult doing work/chores Not difficult at all Not difficult at all Not difficult at all    BP Readings from Last 3 Encounters:  01/21/23 112/78  05/07/22 128/76  02/28/22 (!) 107/54    Physical Exam Vitals and nursing note reviewed. Exam conducted with a chaperone present.  Constitutional:      General: She is not in acute distress.    Appearance: She is not diaphoretic.  HENT:     Head: Normocephalic and atraumatic.     Right Ear: External ear normal.     Left Ear: External ear normal.     Nose: Nose normal.     Mouth/Throat:     Mouth: Mucous membranes are moist.  Eyes:     General:        Right eye: No discharge.        Left eye: No discharge.     Conjunctiva/sclera: Conjunctivae normal.     Pupils: Pupils are equal, round, and reactive to light.  Neck:     Thyroid: No thyromegaly.     Vascular: No JVD.  Cardiovascular:     Rate and Rhythm: Normal rate and regular rhythm.     Heart sounds: Normal heart sounds. No murmur heard.    No friction rub. No gallop.  Pulmonary:     Effort: Pulmonary effort is normal.     Breath sounds: Normal breath sounds. No wheezing, rhonchi or rales.  Abdominal:     General: Bowel sounds are normal.     Palpations: Abdomen is soft. There is no mass.     Tenderness: There  is no abdominal tenderness. There is no guarding.  Musculoskeletal:        General: Normal range of motion.     Cervical back: Normal range of motion and neck supple.  Lymphadenopathy:     Cervical: No cervical adenopathy.  Skin:    General: Skin is warm and dry.  Neurological:     Mental Status: She is alert.     Deep Tendon Reflexes: Reflexes are normal and symmetric.     Wt Readings from Last 3 Encounters:  01/21/23 143 lb (64.9 kg)  05/07/22 155 lb (70.3 kg)  02/28/22 159 lb 8 oz (72.3 kg)    BP 112/78   Pulse 76   Ht 5' 2.5" (1.588 m)   Wt 143 lb (64.9 kg)   SpO2 97%   BMI 25.74 kg/m   Assessment and Plan: 1. Diabetes mellitus treated with oral medication (HCC) Chronic.  Controlled.  Stable.  Continue glipizide XL 2.5 and metformin 500 mg daily.  Will recheck in 4 months. - glipiZIDE (GLUCOTROL XL) 2.5 MG 24 hr tablet; TAKE 1 TABLET BY MOUTH EVERY DAY WITH BREAKFAST  Dispense: 90 tablet; Refill: 1 - metFORMIN (GLUCOPHAGE-XR) 500 MG 24 hr tablet; Take 1 tablet (500 mg total) by mouth daily.  Dispense: 90 tablet; Refill: 1  2. Essential hypertension Chronic.  Controlled.  Stable.  Blood pressure is 112/78.  Continue hydrochlorothiazide 25 mg once a day.  We will check a comprehensive metabolic panel for electrolytes and GFR. - hydrochlorothiazide (HYDRODIURIL) 25 MG tablet; Take 1 tablet (25 mg total) by mouth daily.  Dispense: 90 tablet; Refill: 1  3. Hypothyroidism, unspecified type Chronic.  Controlled.  Stable.  Patient is currently on alternating doses of 75 and 50 mcg of levothyroxine.  Will continue with current dosing and check TSH for  current level of control. - levothyroxine (SYNTHROID) 50 MCG tablet; TAKE 1 TABLET BY MOUTH ON MONDAY, WEDNESDAY AND FRIDAY  Dispense: 90 tablet; Refill: 1 - levothyroxine (SYNTHROID) 75 MCG tablet; TAKE 1 TABLET BY MOUTH EVERY T, TH, Sat, and Sunday  Dispense: 90 tablet; Refill: 1  4. Mild intermittent reactive airway disease  without complication Chronic.  Intermittent.  Mild.  Continue Singulair 10 mg once a day. - montelukast (SINGULAIR) 10 MG tablet; TAKE 1 TABLET BY MOUTH EVERYDAY AT BEDTIME  Dispense: 90 tablet; Refill: 1  5. Mixed hyperlipidemia Chronic.  Controlled.  Stable.  Currently not on medication but controlling with diet.  6. Weight loss Recent weight loss that patient and her husband have been decreasing their intake and this may be the explanation for the current weight loss as it is now.  However we will check CMP CBC thyroid panel and sed rate to make sure that there is no insidious concern. - Comprehensive metabolic panel - CBC with Differential/Platelet - Lipid Panel With LDL/HDL Ratio - Thyroid Panel With TSH - Sed Rate (ESR)     Elizabeth Sauer, MD

## 2023-01-22 ENCOUNTER — Ambulatory Visit (INDEPENDENT_AMBULATORY_CARE_PROVIDER_SITE_OTHER): Payer: BC Managed Care – PPO | Admitting: Family Medicine

## 2023-01-22 ENCOUNTER — Encounter: Payer: Self-pay | Admitting: Family Medicine

## 2023-01-22 VITALS — BP 138/70 | HR 65 | Ht 62.5 in | Wt 143.0 lb

## 2023-01-22 DIAGNOSIS — J301 Allergic rhinitis due to pollen: Secondary | ICD-10-CM | POA: Diagnosis not present

## 2023-01-22 DIAGNOSIS — D509 Iron deficiency anemia, unspecified: Secondary | ICD-10-CM | POA: Diagnosis not present

## 2023-01-22 DIAGNOSIS — G5601 Carpal tunnel syndrome, right upper limb: Secondary | ICD-10-CM | POA: Diagnosis not present

## 2023-01-22 DIAGNOSIS — M1812 Unilateral primary osteoarthritis of first carpometacarpal joint, left hand: Secondary | ICD-10-CM | POA: Diagnosis not present

## 2023-01-22 LAB — COMPREHENSIVE METABOLIC PANEL
ALT: 11 [IU]/L (ref 0–32)
AST: 20 [IU]/L (ref 0–40)
Albumin: 4.2 g/dL (ref 3.9–4.9)
Alkaline Phosphatase: 70 [IU]/L (ref 44–121)
BUN/Creatinine Ratio: 18 (ref 12–28)
BUN: 11 mg/dL (ref 8–27)
Bilirubin Total: 0.2 mg/dL (ref 0.0–1.2)
CO2: 24 mmol/L (ref 20–29)
Calcium: 9 mg/dL (ref 8.7–10.3)
Chloride: 95 mmol/L — ABNORMAL LOW (ref 96–106)
Creatinine, Ser: 0.62 mg/dL (ref 0.57–1.00)
Globulin, Total: 2.6 g/dL (ref 1.5–4.5)
Glucose: 101 mg/dL — ABNORMAL HIGH (ref 70–99)
Potassium: 4.2 mmol/L (ref 3.5–5.2)
Sodium: 134 mmol/L (ref 134–144)
Total Protein: 6.8 g/dL (ref 6.0–8.5)
eGFR: 99 mL/min/{1.73_m2} (ref >59)

## 2023-01-22 LAB — CBC WITH DIFFERENTIAL/PLATELET
Basophils Absolute: 0 10*3/uL (ref 0.0–0.2)
Basos: 1 %
EOS (ABSOLUTE): 0.1 10*3/uL (ref 0.0–0.4)
Eos: 2 %
Hematocrit: 28.2 % — ABNORMAL LOW (ref 34.0–46.6)
Hemoglobin: 8.2 g/dL — ABNORMAL LOW (ref 11.1–15.9)
Immature Grans (Abs): 0 10*3/uL (ref 0.0–0.1)
Immature Granulocytes: 0 %
Lymphocytes Absolute: 1.8 10*3/uL (ref 0.7–3.1)
Lymphs: 37 %
MCH: 23.2 pg — ABNORMAL LOW (ref 26.6–33.0)
MCHC: 29.1 g/dL — ABNORMAL LOW (ref 31.5–35.7)
MCV: 80 fL (ref 79–97)
Monocytes Absolute: 0.5 10*3/uL (ref 0.1–0.9)
Monocytes: 10 %
Neutrophils Absolute: 2.5 10*3/uL (ref 1.4–7.0)
Neutrophils: 50 %
Platelets: 350 10*3/uL (ref 150–450)
RBC: 3.53 x10E6/uL — ABNORMAL LOW (ref 3.77–5.28)
RDW: 15.2 % (ref 11.7–15.4)
WBC: 5 10*3/uL (ref 3.4–10.8)

## 2023-01-22 LAB — THYROID PANEL WITH TSH
Free Thyroxine Index: 1.8 (ref 1.2–4.9)
T3 Uptake Ratio: 22 % — ABNORMAL LOW (ref 24–39)
T4, Total: 8.2 ug/dL (ref 4.5–12.0)
TSH: 0.108 u[IU]/mL — ABNORMAL LOW (ref 0.450–4.500)

## 2023-01-22 LAB — LIPID PANEL WITH LDL/HDL RATIO
Cholesterol, Total: 190 mg/dL (ref 100–199)
HDL: 93 mg/dL (ref >39)
LDL Chol Calc (NIH): 87 mg/dL (ref 0–99)
LDL/HDL Ratio: 0.9 ratio (ref 0.0–3.2)
Triglycerides: 53 mg/dL (ref 0–149)
VLDL Cholesterol Cal: 10 mg/dL (ref 5–40)

## 2023-01-22 LAB — SEDIMENTATION RATE: Sed Rate: 6 mm/h (ref 0–40)

## 2023-01-22 MED ORDER — FLUTICASONE PROPIONATE 50 MCG/ACT NA SUSP: NASAL | 11 refills | Status: AC

## 2023-01-22 NOTE — Patient Instructions (Signed)
Iron-Rich Diet  Iron is a mineral that helps your body produce hemoglobin. Hemoglobin is a protein in red blood cells that carries oxygen to your body's tissues. Eating too little iron may cause you to feel weak and tired, and it can increase your risk of infection. Iron is naturally found in many foods, and many foods have iron added to them (are iron-fortified). You may need to follow an iron-rich diet if you do not have enough iron in your body due to certain medical conditions. The amount of iron that you need each day depends on your age, your sex, and any medical conditions you have. Follow instructions from your health care provider or a dietitian about how much iron you should eat each day. What are tips for following this plan? Reading food labels Check food labels to see how many milligrams (mg) of iron are in each serving. Cooking Cook foods in pots and pans that are made from iron. Take these steps to make it easier for your body to absorb iron from certain foods: Soak beans overnight before cooking. Soak whole grains overnight and drain them before using. Ferment flours before baking, such as by using yeast in bread dough. Meal planning When you eat foods that contain iron, you should eat them with foods that are high in vitamin C. These include oranges, peppers, tomatoes, potatoes, and mangoes. Vitamin C helps your body absorb iron. Certain foods and drinks prevent your body from absorbing iron properly. Avoid eating these foods in the same meal as iron-rich foods or with iron supplements. These foods include: Coffee, black tea, and red wine. Milk, dairy products, and foods that are high in calcium. Beans and soybeans. Whole grains. General information Take iron supplements only as told by your health care provider. An overdose of iron can be life-threatening. If you were prescribed iron supplements, take them with orange juice or a vitamin C supplement. When you eat  iron-fortified foods or take an iron supplement, you should also eat foods that naturally contain iron, such as meat, poultry, and fish. Eating naturally iron-rich foods helps your body absorb the iron that is added to other foods or contained in a supplement. Iron from animal sources is better absorbed than iron from plant sources. What foods should I eat? Fruits Prunes. Raisins. Eat fruits high in vitamin C, such as oranges, grapefruits, and strawberries, with iron-rich foods. Vegetables Spinach (cooked). Green peas. Broccoli. Fermented vegetables. Eat vegetables high in vitamin C, such as leafy greens, potatoes, bell peppers, and tomatoes, with iron-rich foods. Grains Iron-fortified breakfast cereal. Iron-fortified whole-wheat bread. Enriched rice. Sprouted grains. Meats and other proteins Beef liver. Beef. [place]. Chicken. Oysters. Shrimp. Tuna. Sardines. Chickpeas. Nuts. Tofu. Pumpkin seeds. Beverages Tomato juice. Fresh orange juice. Prune juice. Hibiscus tea. Iron-fortified instant breakfast shakes. Sweets and desserts Blackstrap molasses. Seasonings and condiments Tahini. Fermented soy sauce. Other foods Wheat germ. The items listed above may not be a complete list of recommended foods and beverages. Contact a dietitian for more information. What foods should I limit? These are foods that should be limited while eating iron-rich foods as they can reduce the absorption of iron in your body. Grains Whole grains. Bran cereal. Bran flour. Meats and other proteins Soybeans. Products made from soy protein. Black beans. Lentils. Mung beans. Split peas. Dairy Milk. Cream. Cheese. Yogurt. Cottage cheese. Beverages Coffee. Black tea. Red wine. Sweets and desserts Cocoa. Chocolate. Ice cream. Seasonings and condiments Basil. Oregano. Large amounts of parsley. The items listed  above may not be a complete list of foods and beverages you should limit. Contact a dietitian for more  information. Summary Iron is a mineral that helps your body produce hemoglobin. Hemoglobin is a protein in red blood cells that carries oxygen to your body's tissues. Iron is naturally found in many foods, and many foods have iron added to them (are iron-fortified). When you eat foods that contain iron, you should eat them with foods that are high in vitamin C. Vitamin C helps your body absorb iron. Certain foods and drinks prevent your body from absorbing iron properly, such as whole grains and dairy products. You should avoid eating these foods in the same meal as iron-rich foods or with iron supplements. This information is not intended to replace advice given to you by your health care provider. Make sure you discuss any questions you have with your health care provider. Document Revised: 02/08/2020 Document Reviewed: 02/08/2020 Elsevier Patient Education  2024 [employer].

## 2023-01-22 NOTE — Progress Notes (Signed)
Date:  01/22/2023   Name:  Phyllis Wagner   DOB:  [birth year]   MRN:  [id number]   Chief Complaint: Anemia  Anemia Presents for initial visit. Symptoms include anorexia, light-headedness, malaise/fatigue, pallor, paresthesias and weight loss. There has been no abdominal pain, bruising/bleeding easily, confusion, fever, leg swelling, palpitations or pica. Signs of blood loss that are not present include hematemesis, hematochezia, melena, menorrhagia and vaginal bleeding. Past treatments include changes in diet (not eating meats alot).    Lab Results  Component Value Date   NA 134 01/21/2023   K 4.2 01/21/2023   CO2 24 01/21/2023   GLUCOSE 101 (H) 01/21/2023   BUN 11 01/21/2023   CREATININE 0.62 01/21/2023   CALCIUM 9.0 01/21/2023   EGFR 99 01/21/2023   GFRNONAA 96 04/14/2020   Lab Results  Component Value Date   CHOL 190 01/21/2023   HDL 93 01/21/2023   LDLCALC 87 01/21/2023   TRIG 53 01/21/2023   CHOLHDL 2.5 02/04/2018   Lab Results  Component Value Date   TSH 0.108 (L) 01/21/2023   Lab Results  Component Value Date   HGBA1C 6.6 (H) 05/07/2022   Lab Results  Component Value Date   WBC 5.0 01/21/2023   HGB 8.2 (L) 01/21/2023   HCT 28.2 (L) 01/21/2023   MCV 80 01/21/2023   PLT 350 01/21/2023   Lab Results  Component Value Date   ALT 11 01/21/2023   AST 20 01/21/2023   ALKPHOS 70 01/21/2023   BILITOT 0.2 01/21/2023   No results found for: "25OHVITD2", "25OHVITD3", "VD25OH"   Review of Systems  Constitutional:  Positive for malaise/fatigue and weight loss. Negative for fever.  Respiratory:  Negative for chest tightness, shortness of breath and wheezing.   Cardiovascular:  Negative for chest pain and palpitations.  Gastrointestinal:  Positive for anorexia. Negative for abdominal distention, abdominal pain, anal bleeding, blood in stool, constipation, diarrhea, hematemesis, hematochezia, melena, nausea, rectal pain and vomiting.  Genitourinary:   Negative for menorrhagia and vaginal bleeding.  Skin:  Positive for pallor.  Neurological:  Positive for light-headedness and paresthesias.  Hematological:  Does not bruise/bleed easily.  Psychiatric/Behavioral:  Negative for confusion.     Patient Active Problem List   Diagnosis Date Noted   Calculus of gallbladder without cholecystitis without obstruction 06/12/2017   Chronic maxillary sinusitis 03/15/2015   Chronic rhinitis 03/15/2015   Mild intermittent asthma without complication 03/15/2015   Oroantral fistula 03/15/2015   Essential hypertension 01/05/2015   Thyroid activity decreased 01/05/2015   Hyperlipidemia 01/05/2015   Recurrent major depressive disorder, in partial remission (HCC) 01/05/2015    Allergies  Allergen Reactions   Aspirin Anaphylaxis, Hives and Shortness Of Breath   Biaxin [Clarithromycin] Anaphylaxis, Hives and Shortness Of Breath   Nsaids Hives and Swelling   Penicillins Anaphylaxis, Hives and Swelling    Has patient had a PCN reaction causing immediate rash, facial/tongue/throat swelling, SOB or lightheadedness with hypotension:Yes Has patient had a PCN reaction causing severe rash involving mucus membranes or skin necrosis: Unknown Has patient had a PCN reaction that required hospitalization: Unknown Has patient had a PCN reaction occurring within the last 10 years: No Childhood reaction. If all of the above answers are "NO", then may proceed with Cephalosporin use.    Sulfa Antibiotics Hives   Ancef [Cefazolin] Rash    Past Surgical History:  Procedure Laterality Date   BUNIONECTOMY Right 07/19/2021   Procedure: BUNIONECTOMY - LAPIDUS-TYPE;  Surgeon: Gwyneth Revels, DPM;  Location:  [facility];  Service: Podiatry;  Laterality: Right;   BUNIONECTOMY Left 02/28/2022   Procedure: BUNIONECTOMY - LAPIDUS TYPE;  Surgeon: Gwyneth Revels, DPM;  Location: [facility];  Service: Podiatry;  Laterality: Left;  GENERAL WITH LOCAL    CHOLECYSTECTOMY N/A 06/21/2017   Procedure: LAPAROSCOPIC CHOLECYSTECTOMY WITH INTRAOPERATIVE CHOLANGIOGRAM;  Surgeon: Earline Mayotte, MD;  Location: [facility];  Service: General;  Laterality: N/A;   COLONOSCOPY  2012   cleared for 10 yrs- [facility]   EYE SURGERY Bilateral 2018   FOOT ARTHRODESIS Right 07/19/2021   Procedure: ARTHRODESIS FOOT; LISFRANC; MULTIPLE;  Surgeon: Gwyneth Revels, DPM;  Location: [facility];  Service: Podiatry;  Laterality: Right;  Diabetic   GASTRIC BYPASS  2013   Dr Ethelene Hal at [facility]   KNEE SURGERY Left    LYMPH NODE DISSECTION     TONSILLECTOMY     UMBILICAL HERNIA REPAIR  06/21/2017   Procedure: HERNIA REPAIR UMBILICAL ADULT;  Surgeon: Earline Mayotte, MD;  Location: [facility];  Service: General;;   VAGINAL HYSTERECTOMY     ovaries remain    Social History   Tobacco Use   Smoking status: Former    Current packs/day: 0.00    Average packs/day: 1 pack/day for 20.0 years (20.0 ttl pk-yrs)    Types: Cigarettes    Start date: 06/18/1990    Quit date: 06/18/2010    Years since quitting: 12.6   Smokeless tobacco: Never  Vaping Use   Vaping status: Never Used  Substance Use Topics   Alcohol use: Not Currently    Alcohol/week: 0.0 standard drinks of alcohol   Drug use: No     Medication list has been reviewed and updated.  Current Meds  Medication Sig   albuterol (VENTOLIN HFA) 108 (90 Base) MCG/ACT inhaler TAKE 2 PUFFS BY MOUTH EVERY 6 HOURS AS NEEDED FOR WHEEZE OR SHORTNESS OF BREATH   cholecalciferol (VITAMIN D) 1000 UNITS tablet Take 1,000 Units by mouth daily.   citalopram (CELEXA) 20 MG tablet TAKE 1 TABLET (20 MG TOTAL) BY MOUTH IN THE MORNING AND AT BEDTIME   clindamycin (CLEOCIN) 300 MG capsule Take by mouth.   EPINEPHrine 0.3 mg/0.3 mL IJ SOAJ injection Inject 0.3 mg into the muscle as needed for anaphylaxis.   fluticasone (FLONASE) 50 MCG/ACT nasal spray SPRAY 2 SPRAYS INTO EACH NOSTRIL EVERY DAY   gabapentin (NEURONTIN) 300 MG  capsule Take 300 mg by mouth at bedtime.   glipiZIDE (GLUCOTROL XL) 2.5 MG 24 hr tablet TAKE 1 TABLET BY MOUTH EVERY DAY WITH BREAKFAST   Glucosamine-Chondroitin (COSAMIN DS PO) Take 2 tablets by mouth daily.   hydrochlorothiazide (HYDRODIURIL) 25 MG tablet Take 1 tablet (25 mg total) by mouth daily.   levothyroxine (SYNTHROID) 50 MCG tablet TAKE 1 TABLET BY MOUTH ON MONDAY, WEDNESDAY AND FRIDAY   levothyroxine (SYNTHROID) 75 MCG tablet TAKE 1 TABLET BY MOUTH EVERY T, TH, Sat, and Sunday   metFORMIN (GLUCOPHAGE-XR) 500 MG 24 hr tablet Take 1 tablet (500 mg total) by mouth daily.   montelukast (SINGULAIR) 10 MG tablet TAKE 1 TABLET BY MOUTH EVERYDAY AT BEDTIME   Omega-3 Fatty Acids (FISH OIL) 1000 MG CAPS Take 1 capsule (1,000 mg total) by mouth daily.   Polyethyl Glycol-Propyl Glycol 0.4-0.3 % SOLN Place 1 drop into both eyes 3 (three) times daily as needed (for dry eyes.).   potassium chloride (MICRO-K) 10 MEQ CR capsule Take 10 mEq by mouth daily.   simvastatin (ZOCOR) 20 MG  tablet TAKE 1 TABLET BY MOUTH EVERY DAY   traMADol (ULTRAM) 50 MG tablet Take 1 tablet (50 mg total) by mouth every 6 (six) hours as needed.       01/21/2023    1:37 PM 05/07/2022    9:31 AM 01/02/2022    8:14 AM 08/15/2021    8:17 AM  GAD 7 : Generalized Anxiety Score  Nervous, Anxious, on Edge 0 0 0 0  Control/stop worrying 0 0 3 0  Worry too much - different things 0 0 3 0  Trouble relaxing 0 0 0 0  Restless 0 0 0 0  Easily annoyed or irritable 0 0 3 0  Afraid - awful might happen 0 0 0 0  Total GAD 7 Score 0 0 9 0  Anxiety Difficulty Not difficult at all Not difficult at all Somewhat difficult Not difficult at all       01/21/2023    1:37 PM 05/07/2022    9:30 AM 01/02/2022    8:13 AM  Depression screen PHQ 2/9  Decreased Interest 0 0 1  Down, Depressed, Hopeless 0 0 0  PHQ - 2 Score 0 0 1  Altered sleeping 3 0 3  Tired, decreased energy 3 0 3  Change in appetite 0 0 2  Feeling bad or failure about  yourself  0 0 0  Trouble concentrating 0 0 0  Moving slowly or fidgety/restless 0 0 0  Suicidal thoughts 0 0 0  PHQ-9 Score 6 0 9  Difficult doing work/chores Not difficult at all Not difficult at all Not difficult at all    BP Readings from Last 3 Encounters:  01/22/23 138/70  01/21/23 112/78  05/07/22 128/76    Physical Exam Vitals and nursing note reviewed.  Constitutional:      General: She is not in acute distress.    Appearance: She is not diaphoretic.  HENT:     Head: Normocephalic and atraumatic.     Right Ear: External ear normal.     Left Ear: External ear normal.     Nose: Nose normal.  Eyes:     General:        Right eye: No discharge.        Left eye: No discharge.     Conjunctiva/sclera: Conjunctivae normal.     Pupils: Pupils are equal, round, and reactive to light.  Neck:     Thyroid: No thyromegaly.     Vascular: No JVD.  Cardiovascular:     Rate and Rhythm: Normal rate and regular rhythm.     Heart sounds: Normal heart sounds. No murmur heard.    No friction rub. No gallop.  Pulmonary:     Effort: Pulmonary effort is normal.     Breath sounds: Normal breath sounds. No wheezing or rhonchi.  Abdominal:     General: Bowel sounds are normal.     Palpations: Abdomen is soft. There is no mass.     Tenderness: There is no abdominal tenderness. There is no guarding.  Genitourinary:    Rectum: Normal. Guaiac result negative. No mass, tenderness, anal fissure, external hemorrhoid or internal hemorrhoid.  Musculoskeletal:        General: Normal range of motion.     Cervical back: Normal range of motion and neck supple.  Lymphadenopathy:     Cervical: No cervical adenopathy.  Skin:    General: Skin is warm and dry.  Neurological:     Mental Status: She is alert.  Deep Tendon Reflexes: Reflexes are normal and symmetric.     Wt Readings from Last 3 Encounters:  01/22/23 143 lb (64.9 kg)  01/21/23 143 lb (64.9 kg)  05/07/22 155 lb (70.3 kg)    BP  138/70   Pulse 65   Ht 5' 2.5" (1.588 m)   Wt 143 lb (64.9 kg)   SpO2 97%   BMI 25.74 kg/m   Assessment and Plan: 1. Seasonal allergic rhinitis due to pollen Chronic.  Episodic.  Patient is currently having symptomatology from allergies presumably from the autumn pollen count and perhaps small due to leaves falling.  We will refill fluticasone nasal spray to be used on an as-needed basis. - fluticasone (FLONASE) 50 MCG/ACT nasal spray; SPRAY 2 SPRAYS INTO EACH NOSTRIL EVERY DAY  Dispense: 48 mL; Refill: 11  2. Microcytic anemia New onset.  Persistent.  Stable.  Hemoglobin is at 8.2 and the indices are microcytic.   patient has been experiencing fatigue and has a pallor.  This is consistent with an iron deficiency anemia and we will obtain serum ferritin with iron studies.  We will continue with over-the-counter ferrous sulfate 325 mg once a day.  Referral has been placed with hematology for evaluation given that the patient has had a history of gastric bypass surgery and that this may be an absorption concern. - Ambulatory referral to Hematology / Oncology     Elizabeth Sauer, MD

## 2023-01-23 DIAGNOSIS — M79672 Pain in left foot: Secondary | ICD-10-CM | POA: Diagnosis not present

## 2023-01-23 DIAGNOSIS — G5791 Unspecified mononeuropathy of right lower limb: Secondary | ICD-10-CM | POA: Diagnosis not present

## 2023-01-23 DIAGNOSIS — M2021 Hallux rigidus, right foot: Secondary | ICD-10-CM | POA: Diagnosis not present

## 2023-01-23 DIAGNOSIS — M79671 Pain in right foot: Secondary | ICD-10-CM | POA: Diagnosis not present

## 2023-01-23 LAB — IRON AND TIBC
Iron Saturation: 5 % — CL (ref 15–55)
Iron: 21 ug/dL — ABNORMAL LOW (ref 27–139)
Total Iron Binding Capacity: 426 ug/dL (ref 250–450)
UIBC: 405 ug/dL — ABNORMAL HIGH (ref 118–369)

## 2023-01-23 LAB — SPECIMEN STATUS REPORT

## 2023-01-23 LAB — FERRITIN: Ferritin: 8 ng/mL — ABNORMAL LOW (ref 15–150)

## 2023-01-24 ENCOUNTER — Encounter: Payer: Self-pay | Admitting: Oncology

## 2023-01-24 ENCOUNTER — Inpatient Hospital Stay: Payer: BC Managed Care – PPO

## 2023-01-24 ENCOUNTER — Encounter: Payer: Self-pay | Admitting: Family Medicine

## 2023-01-24 ENCOUNTER — Other Ambulatory Visit: Payer: Self-pay | Admitting: Family Medicine

## 2023-01-24 ENCOUNTER — Inpatient Hospital Stay: Payer: BC Managed Care – PPO | Attending: Oncology | Admitting: Oncology

## 2023-01-24 VITALS — BP 139/71 | HR 54 | Temp 97.8°F | Resp 18 | Ht 62.0 in | Wt 144.5 lb

## 2023-01-24 DIAGNOSIS — Z9884 Bariatric surgery status: Secondary | ICD-10-CM | POA: Insufficient documentation

## 2023-01-24 DIAGNOSIS — Z886 Allergy status to analgesic agent status: Secondary | ICD-10-CM | POA: Diagnosis not present

## 2023-01-24 DIAGNOSIS — D509 Iron deficiency anemia, unspecified: Secondary | ICD-10-CM | POA: Diagnosis not present

## 2023-01-24 DIAGNOSIS — Z8249 Family history of ischemic heart disease and other diseases of the circulatory system: Secondary | ICD-10-CM | POA: Insufficient documentation

## 2023-01-24 DIAGNOSIS — Z880 Allergy status to penicillin: Secondary | ICD-10-CM | POA: Insufficient documentation

## 2023-01-24 DIAGNOSIS — R5383 Other fatigue: Secondary | ICD-10-CM | POA: Insufficient documentation

## 2023-01-24 DIAGNOSIS — F32A Depression, unspecified: Secondary | ICD-10-CM | POA: Diagnosis not present

## 2023-01-24 DIAGNOSIS — G5600 Carpal tunnel syndrome, unspecified upper limb: Secondary | ICD-10-CM | POA: Insufficient documentation

## 2023-01-24 DIAGNOSIS — E785 Hyperlipidemia, unspecified: Secondary | ICD-10-CM | POA: Diagnosis not present

## 2023-01-24 DIAGNOSIS — D508 Other iron deficiency anemias: Secondary | ICD-10-CM

## 2023-01-24 DIAGNOSIS — Z90710 Acquired absence of both cervix and uterus: Secondary | ICD-10-CM | POA: Diagnosis not present

## 2023-01-24 DIAGNOSIS — Z87891 Personal history of nicotine dependence: Secondary | ICD-10-CM | POA: Diagnosis not present

## 2023-01-24 DIAGNOSIS — Z881 Allergy status to other antibiotic agents status: Secondary | ICD-10-CM | POA: Diagnosis not present

## 2023-01-24 DIAGNOSIS — Z833 Family history of diabetes mellitus: Secondary | ICD-10-CM | POA: Insufficient documentation

## 2023-01-24 DIAGNOSIS — Z79899 Other long term (current) drug therapy: Secondary | ICD-10-CM | POA: Insufficient documentation

## 2023-01-24 DIAGNOSIS — I10 Essential (primary) hypertension: Secondary | ICD-10-CM | POA: Insufficient documentation

## 2023-01-24 DIAGNOSIS — Z882 Allergy status to sulfonamides status: Secondary | ICD-10-CM | POA: Diagnosis not present

## 2023-01-24 DIAGNOSIS — Z9049 Acquired absence of other specified parts of digestive tract: Secondary | ICD-10-CM | POA: Diagnosis not present

## 2023-01-24 DIAGNOSIS — K9589 Other complications of other bariatric procedure: Secondary | ICD-10-CM

## 2023-01-24 MED ORDER — IRON (FERROUS SULFATE) 325 (65 FE) MG PO TABS: 325.0000 mg | ORAL_TABLET | Freq: Every day | ORAL | 1 refills | Status: DC

## 2023-01-24 NOTE — Assessment & Plan Note (Addendum)
Labs are reviewed and discussed with patient. Lab Results  Component Value Date   HGB 8.2 (L) 01/21/2023   TIBC 426 01/21/2023   IRONPCTSAT 5 (LL) 01/21/2023   FERRITIN 8 (L) 01/21/2023    I discussed about option of continue oral iron supplementation and repeat blood work for evaluation of treatment response.  If no significant improvement, then proceed with IV Venofer treatments. She has gastric bypass, absorbing efficiency is likely low.  Alternative option of proceed with IV Venofer treatments. I discussed about the potential risks including but not limited to allergic reactions/infusion reactions including anaphylactic reactions, phlebitis, high blood pressure, wheezing, SOB, skin rash, weight gain,dark urine, leg swelling, back pain, headache, nausea and fatigue, etc. Patient desires to achieved higher level of iron faster for adequate hematopoesis and agrees with IV Venofer. Plan IV venofer weekly x 4   With her history of anaphylaxis reaction to other medication, I recommend premed with Benadryl and Dexamethasone with her first dose, if she tolerates well, will de-escalate premed to benadryl with additional treatments.

## 2023-01-24 NOTE — Progress Notes (Signed)
Hematology/Oncology Consult note [phone] [phone]        REFERRING PROVIDER: Duanne Limerick, MD   CHIEF COMPLAINTS/REASON FOR VISIT:  Evaluation of anemia.    ASSESSMENT & PLAN:   Iron deficiency anemia Labs are reviewed and discussed with patient. Lab Results  Component Value Date   HGB 8.2 (L) 01/21/2023   TIBC 426 01/21/2023   IRONPCTSAT 5 (LL) 01/21/2023   FERRITIN 8 (L) 01/21/2023    I discussed about option of continue oral iron supplementation and repeat blood work for evaluation of treatment response.  If no significant improvement, then proceed with IV Venofer treatments. She has gastric bypass, absorbing efficiency is likely low.  Alternative option of proceed with IV Venofer treatments. I discussed about the potential risks including but not limited to allergic reactions/infusion reactions including anaphylactic reactions, phlebitis, high blood pressure, wheezing, SOB, skin rash, weight gain,dark urine, leg swelling, back pain, headache, nausea and fatigue, etc. Patient desires to achieved higher level of iron faster for adequate hematopoesis and agrees with IV Venofer. Plan IV venofer weekly x 4   With her history of anaphylaxis reaction to other medication, I recommend premed with Benadryl and Dexamethasone with her first dose, if she tolerates well, will de-escalate premed to benadryl with additional treatments.    Orders Placed This Encounter  Procedures   CBC with Differential (Cancer Center Only)    Standing Status:   Future    Standing Expiration Date:   01/24/2024   Iron and TIBC    Standing Status:   Future    Standing Expiration Date:   01/24/2024   Ferritin    Standing Status:   Future    Standing Expiration Date:   01/24/2024   Retic Panel    Standing Status:   Future    Standing Expiration Date:   01/24/2024   Follow up in 3 months.  All questions were answered. The patient knows to call the clinic with any problems,  questions or concerns.  Rickard Patience, MD, PhD [facility] Hematology Oncology 01/24/2023   HISTORY OF PRESENTING ILLNESS:   Phyllis Wagner is a  67 y.o.  female with PMH listed below was seen in consultation at the request of  Duanne Limerick, MD  for evaluation of anemia.   The patient, with a history of gastric bypass surgery in 2012, presents with new-onset anemia and a recent drop in hemoglobin to 8.2. She reports no history of blood in the stool and had a colonoscopy in 2022 with no significant findings. The patient has been feeling increasingly fatigued, which she attributes to the anemia. She also reports unintentional weight loss and a decreased appetite, along with a persistent craving for ice.  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 Despite these efforts, she reports feeling "washed out and exhausted." patient is not currently on any blood thinners. She has just received a prescription for iron supplements but has not started taking them yet. The patient expresses concern about her ongoing symptoms and is eager to feel better.  The patient also mentions a history of carpal tunnel syndrome and is scheduled for an outpatient procedure in the coming week. She has a known allergy to NSAIDs and aspirin, which has previously resulted in severe reactions including anaphylaxis reaction. Marland Kitchen   MEDICAL HISTORY:  Past Medical History:  Diagnosis Date   Arthritis    knees   Carpal tunnel syndrome    Complication of anesthesia    HARD TO WAKE UP AFTER LYMPH NODE REMOVAL AND CHILDBIRTH   Depression    Diabetes mellitus without complication (HCC)    Heart murmur    History of hiatal hernia    Hyperlipidemia    Hypertension    Hypothyroidism    Thyroid disease    Wears dentures    partial upper    SURGICAL HISTORY: Past  Surgical History:  Procedure Laterality Date   BUNIONECTOMY Right 07/19/2021   Procedure: BUNIONECTOMY - LAPIDUS-TYPE;  Surgeon: Gwyneth Revels, DPM;  Location: [facility];  Service: Podiatry;  Laterality: Right;   BUNIONECTOMY Left 02/28/2022   Procedure: BUNIONECTOMY - LAPIDUS TYPE;  Surgeon: Gwyneth Revels, DPM;  Location: [facility];  Service: Podiatry;  Laterality: Left;  GENERAL WITH LOCAL   CHOLECYSTECTOMY N/A 06/21/2017   Procedure: LAPAROSCOPIC CHOLECYSTECTOMY WITH INTRAOPERATIVE CHOLANGIOGRAM;  Surgeon: Earline Mayotte, MD;  Location: [facility];  Service: General;  Laterality: N/A;   COLONOSCOPY  2012   cleared for 10 yrs- [employer] Doc   EYE SURGERY Bilateral 2018   FOOT ARTHRODESIS Right 07/19/2021   Procedure: ARTHRODESIS FOOT; LISFRANC; MULTIPLE;  Surgeon: Gwyneth Revels, DPM;  Location: [facility];  Service: Podiatry;  Laterality: Right;  Diabetic   GASTRIC BYPASS  2013   [facility] Ethelene Hal at [facility]   KNEE SURGERY Left    LYMPH NODE DISSECTION     TONSILLECTOMY     UMBILICAL HERNIA REPAIR  06/21/2017   Procedure: HERNIA REPAIR UMBILICAL ADULT;  Surgeon: Earline Mayotte, MD;  Location: [facility];  Service: General;;   VAGINAL HYSTERECTOMY     ovaries remain    SOCIAL HISTORY: Social History   Socioeconomic History   Marital status: Married    Spouse name: Not on file   Number of children: Not on file   Years of education: Not on file   Highest education level: Bachelor's degree (e.g., BA, AB, BS)  Occupational History   Not on file  Tobacco Use   Smoking status: Former    Current packs/day: 0.00    Average packs/day: 1 pack/day for 20.0 years (20.0 ttl pk-yrs)    Types: Cigarettes    Start date: 06/18/1990    Quit date: 06/18/2010    Years since quitting: 12.6   Smokeless tobacco: Never  Vaping Use   Vaping status: Never Used  Substance and Sexual Activity   Alcohol use: Not Currently    Alcohol/week: 0.0 standard drinks of alcohol   Drug use:  No   Sexual activity: Yes  Other Topics Concern   Not on file  Social History Narrative   Not on file   Social Determinants of Health   Financial Resource Strain: Low Risk  (01/18/2023)   Overall Financial Resource Strain (CARDIA)    Difficulty of Paying Living Expenses: Not hard at all  Food Insecurity: No Food Insecurity (01/24/2023)   Hunger Vital Sign    Worried About Running Out of Food in the Last Year: Never true    Ran Out of Food in the Last Year: Never true  Transportation Needs: No Transportation Needs (01/24/2023)   PRAPARE - Administrator, Civil Service (Medical): No    Lack of Transportation (Non-Medical): No  Physical Activity: Sufficiently Active (01/18/2023)   Exercise Vital Sign    Days of Exercise per Week: 7 days  Minutes of Exercise per Session: 70 min  Stress: Stress Concern Present (01/23/2023)   Received from [facility] Occupational [facility] - Occupational Stress Questionnaire    Feeling of Stress : To some extent  Social Connections: Socially Integrated (01/23/2023)   Received from [facility]   Social Connection and Isolation Panel [NHANES]    Frequency of Communication with Friends and Family: More than three times a week    Frequency of Social Gatherings with Friends and Family: Three times a week    Attends Religious Services: 1 to 4 times per year    Active Member of Clubs or Organizations: Yes    Attends Banker Meetings: More than 4 times per year    Marital Status: Married  Catering manager Violence: Not At Risk (01/24/2023)   Humiliation, Afraid, Rape, and Kick questionnaire    Fear of Current or Ex-Partner: No    Emotionally Abused: No    Physically Abused: No    Sexually Abused: No    FAMILY HISTORY: Family History  Problem Relation Age of Onset   Heart disease Mother    Atrial fibrillation Mother    Heart disease Father    Diabetes Maternal  Grandmother     ALLERGIES:  is allergic to aspirin, biaxin [clarithromycin], nsaids, penicillins, sulfa antibiotics, and ancef [cefazolin].  MEDICATIONS:  Current Outpatient Medications  Medication Sig Dispense Refill   albuterol (VENTOLIN HFA) 108 (90 Base) MCG/ACT inhaler TAKE 2 PUFFS BY MOUTH EVERY 6 HOURS AS NEEDED FOR WHEEZE OR SHORTNESS OF BREATH 6.7 each 0   cholecalciferol (VITAMIN D) 1000 UNITS tablet Take 1,000 Units by mouth daily.     citalopram (CELEXA) 20 MG tablet TAKE 1 TABLET (20 MG TOTAL) BY MOUTH IN THE MORNING AND AT BEDTIME 90 tablet 1   clindamycin (CLEOCIN) 300 MG capsule Take by mouth.     fluticasone (FLONASE) 50 MCG/ACT nasal spray SPRAY 2 SPRAYS INTO EACH NOSTRIL EVERY DAY 48 mL 11   gabapentin (NEURONTIN) 300 MG capsule Take 300 mg by mouth at bedtime.     glipiZIDE (GLUCOTROL XL) 2.5 MG 24 hr tablet TAKE 1 TABLET BY MOUTH EVERY DAY WITH BREAKFAST 90 tablet 1   Glucosamine-Chondroitin (COSAMIN DS PO) Take 2 tablets by mouth daily.     hydrochlorothiazide (HYDRODIURIL) 25 MG tablet Take 1 tablet (25 mg total) by mouth daily. 90 tablet 1   Iron, Ferrous Sulfate, 325 (65 Fe) MG TABS Take 325 mg by mouth daily. 30 tablet 1   levothyroxine (SYNTHROID) 50 MCG tablet TAKE 1 TABLET BY MOUTH ON MONDAY, WEDNESDAY AND FRIDAY 90 tablet 1   metFORMIN (GLUCOPHAGE-XR) 500 MG 24 hr tablet Take 1 tablet (500 mg total) by mouth daily. 90 tablet 1   Omega-3 Fatty Acids (FISH OIL) 1000 MG CAPS Take 1 capsule (1,000 mg total) by mouth daily. 100 capsule 12   Polyethyl Glycol-Propyl Glycol 0.4-0.3 % SOLN Place 1 drop into both eyes 3 (three) times daily as needed (for dry eyes.).     potassium chloride (MICRO-K) 10 MEQ CR capsule Take 10 mEq by mouth daily.     simvastatin (ZOCOR) 20 MG tablet TAKE 1 TABLET BY MOUTH EVERY DAY 90 tablet 1   traMADol (ULTRAM) 50 MG tablet Take 1 tablet (50 mg total) by mouth every 6 (six) hours as needed. 20 tablet 0   EPINEPHrine 0.3 mg/0.3 mL IJ SOAJ  injection Inject 0.3 mg into the muscle as  needed for anaphylaxis. (Patient not taking: Reported on 01/24/2023) 1 each 1   levothyroxine (SYNTHROID) 75 MCG tablet TAKE 1 TABLET BY MOUTH EVERY T, TH, Sat, and Sunday (Patient not taking: Reported on 01/24/2023) 90 tablet 1   No current facility-administered medications for this visit.    Review of Systems  Constitutional:  Positive for appetite change, fatigue and unexpected weight change. Negative for chills and fever.  HENT:   Negative for hearing loss and voice change.   Eyes:  Negative for eye problems.  Respiratory:  Negative for chest tightness and cough.   Cardiovascular:  Negative for chest pain.  Gastrointestinal:  Negative for abdominal distention, abdominal pain and blood in stool.  Endocrine: Negative for hot flashes.  Genitourinary:  Negative for difficulty urinating and frequency.   Musculoskeletal:  Negative for arthralgias.  Skin:  Negative for itching and rash.  Neurological:  Negative for extremity weakness.  Hematological:  Negative for adenopathy.  Psychiatric/Behavioral:  Negative for confusion.    PHYSICAL EXAMINATION: ECOG PERFORMANCE STATUS: 1 - Symptomatic but completely ambulatory Vitals:   01/24/23 1506  BP: 139/71  Pulse: (!) 54  Resp: 18  Temp: 97.8 F (36.6 C)   Filed Weights   01/24/23 1506  Weight: 144 lb 8 oz (65.5 kg)    Physical Exam Constitutional:      General: She is not in acute distress. HENT:     Head: Normocephalic and atraumatic.  Eyes:     General: No scleral icterus. Cardiovascular:     Rate and Rhythm: Normal rate and regular rhythm.     Heart sounds: Normal heart sounds.  Pulmonary:     Effort: Pulmonary effort is normal. No respiratory distress.     Breath sounds: No wheezing.  Abdominal:     General: Bowel sounds are normal. There is no distension.     Palpations: Abdomen is soft.  Musculoskeletal:        General: No deformity. Normal range of motion.     Cervical  back: Normal range of motion and neck supple.  Skin:    General: Skin is warm and dry.     Coloration: Skin is pale.     Findings: No erythema or rash.  Neurological:     Mental Status: She is alert and oriented to person, place, and time. Mental status is at baseline.  Psychiatric:        Mood and Affect: Mood normal.     LABORATORY DATA:  I have reviewed the data as listed    Latest Ref Rng & Units 01/21/2023    2:15 PM 04/14/2020    9:19 AM 01/14/2019    9:08 AM  CBC  WBC 3.4 - 10.8 x10E3/uL 5.0  5.0  5.7   Hemoglobin 11.1 - 15.9 g/dL 8.2  47.8  29.5   Hematocrit 34.0 - 46.6 % 28.2  35.3  35.4   Platelets 150 - 450 x10E3/uL 350  296  256       Latest Ref Rng & Units 01/21/2023    2:15 PM 02/28/2022   11:00 AM 01/02/2022    9:07 AM  CMP  Glucose 70 - 99 mg/dL 621   308   BUN 8 - 27 mg/dL 11   9   Creatinine 6.57 - 1.00 mg/dL 8.46   9.62   Sodium 952 - 144 mmol/L 134   141   Potassium 3.5 - 5.2 mmol/L 4.2  3.4  4.3   Chloride 96 - 106 mmol/L  95   101   CO2 20 - 29 mmol/L 24   25   Calcium 8.7 - 10.3 mg/dL 9.0   9.7   Total Protein 6.0 - 8.5 g/dL 6.8     Total Bilirubin 0.0 - 1.2 mg/dL 0.2     Alkaline Phos 44 - 121 IU/L 70     AST 0 - 40 IU/L 20     ALT 0 - 32 IU/L 11         RADIOGRAPHIC STUDIES: I have personally reviewed the radiological images as listed and agreed with the findings in the report. No results found.

## 2023-01-30 ENCOUNTER — Inpatient Hospital Stay: Payer: BC Managed Care – PPO

## 2023-01-30 DIAGNOSIS — G5601 Carpal tunnel syndrome, right upper limb: Secondary | ICD-10-CM | POA: Diagnosis not present

## 2023-01-31 ENCOUNTER — Inpatient Hospital Stay: Payer: BC Managed Care – PPO

## 2023-01-31 VITALS — BP 128/67 | HR 52 | Temp 97.5°F | Resp 18

## 2023-01-31 DIAGNOSIS — D508 Other iron deficiency anemias: Secondary | ICD-10-CM

## 2023-01-31 DIAGNOSIS — I10 Essential (primary) hypertension: Secondary | ICD-10-CM | POA: Diagnosis not present

## 2023-01-31 DIAGNOSIS — Z880 Allergy status to penicillin: Secondary | ICD-10-CM | POA: Diagnosis not present

## 2023-01-31 DIAGNOSIS — Z87891 Personal history of nicotine dependence: Secondary | ICD-10-CM | POA: Diagnosis not present

## 2023-01-31 DIAGNOSIS — D509 Iron deficiency anemia, unspecified: Secondary | ICD-10-CM | POA: Diagnosis not present

## 2023-01-31 DIAGNOSIS — F32A Depression, unspecified: Secondary | ICD-10-CM | POA: Diagnosis not present

## 2023-01-31 DIAGNOSIS — R5383 Other fatigue: Secondary | ICD-10-CM | POA: Diagnosis not present

## 2023-01-31 DIAGNOSIS — Z79899 Other long term (current) drug therapy: Secondary | ICD-10-CM | POA: Diagnosis not present

## 2023-01-31 DIAGNOSIS — G5600 Carpal tunnel syndrome, unspecified upper limb: Secondary | ICD-10-CM | POA: Diagnosis not present

## 2023-01-31 DIAGNOSIS — Z9884 Bariatric surgery status: Secondary | ICD-10-CM | POA: Diagnosis not present

## 2023-01-31 DIAGNOSIS — Z886 Allergy status to analgesic agent status: Secondary | ICD-10-CM | POA: Diagnosis not present

## 2023-01-31 DIAGNOSIS — E785 Hyperlipidemia, unspecified: Secondary | ICD-10-CM | POA: Diagnosis not present

## 2023-01-31 DIAGNOSIS — Z8249 Family history of ischemic heart disease and other diseases of the circulatory system: Secondary | ICD-10-CM | POA: Diagnosis not present

## 2023-01-31 DIAGNOSIS — Z9049 Acquired absence of other specified parts of digestive tract: Secondary | ICD-10-CM | POA: Diagnosis not present

## 2023-01-31 DIAGNOSIS — Z881 Allergy status to other antibiotic agents status: Secondary | ICD-10-CM | POA: Diagnosis not present

## 2023-01-31 DIAGNOSIS — Z882 Allergy status to sulfonamides status: Secondary | ICD-10-CM | POA: Diagnosis not present

## 2023-01-31 DIAGNOSIS — Z90710 Acquired absence of both cervix and uterus: Secondary | ICD-10-CM | POA: Diagnosis not present

## 2023-01-31 MED ORDER — DIPHENHYDRAMINE HCL 25 MG PO CAPS
50.0000 mg | ORAL_CAPSULE | Freq: Once | ORAL | Status: AC | PRN
  Administered 2023-01-31: 50 mg via ORAL
  Filled 2023-01-31: qty 2

## 2023-01-31 MED ORDER — IRON SUCROSE 20 MG/ML IV SOLN
200.0000 mg | Freq: Once | INTRAVENOUS | Status: AC
  Administered 2023-01-31: 200 mg via INTRAVENOUS
  Filled 2023-01-31: qty 10

## 2023-01-31 MED ORDER — SODIUM CHLORIDE 0.9% FLUSH
3.0000 mL | Freq: Once | INTRAVENOUS | Status: DC | PRN
Start: 2023-01-31 — End: 2023-01-31
  Filled 2023-01-31: qty 3

## 2023-01-31 MED ORDER — ALTEPLASE 2 MG IJ SOLR
2.0000 mg | Freq: Once | INTRAMUSCULAR | Status: DC | PRN
Start: 2023-01-31 — End: 2023-01-31
  Filled 2023-01-31: qty 2

## 2023-01-31 MED ORDER — DEXAMETHASONE SODIUM PHOSPHATE 10 MG/ML IJ SOLN
10.0000 mg | Freq: Once | INTRAMUSCULAR | Status: AC
  Administered 2023-01-31: 10 mg via INTRAVENOUS
  Filled 2023-01-31: qty 1

## 2023-01-31 MED ORDER — SODIUM CHLORIDE 0.9% FLUSH
10.0000 mL | Freq: Once | INTRAVENOUS | Status: DC | PRN
Start: 2023-01-31 — End: 2023-01-31
  Filled 2023-01-31: qty 10

## 2023-01-31 MED ORDER — HEPARIN SOD (PORK) LOCK FLUSH 100 UNIT/ML IV SOLN
250.0000 [IU] | Freq: Once | INTRAVENOUS | Status: DC | PRN
Start: 2023-01-31 — End: 2023-01-31
  Filled 2023-01-31: qty 5

## 2023-01-31 MED ORDER — HEPARIN SOD (PORK) LOCK FLUSH 100 UNIT/ML IV SOLN
500.0000 [IU] | Freq: Once | INTRAVENOUS | Status: DC | PRN
Start: 2023-01-31 — End: 2023-01-31
  Filled 2023-01-31: qty 5

## 2023-01-31 NOTE — Patient Instructions (Signed)
Iron Sucrose Injection What is this medication? IRON SUCROSE (EYE ern SOO krose) treats low levels of iron (iron deficiency anemia) in people with kidney disease. Iron is a mineral that plays an important role in making red blood cells, which carry oxygen from your lungs to the rest of your body. This medicine may be used for other purposes; ask your health care provider or pharmacist if you have questions. COMMON BRAND NAME(S): Venofer What should I tell my care team before I take this medication? They need to know if you have any of these conditions: Anemia not caused by low iron levels Heart disease High levels of iron in the blood Kidney disease Liver disease An unusual or allergic reaction to iron, other medications, foods, dyes, or preservatives Pregnant or trying to get pregnant Breastfeeding How should I use this medication? This medication is for infusion into a vein. It is given in a hospital or clinic setting. Talk to your care team about the use of this medication in children. While this medication may be prescribed for children as young as 2 years for selected conditions, precautions do apply. Overdosage: If you think you have taken too much of this medicine contact a poison control center or emergency room at once. NOTE: This medicine is only for you. Do not share this medicine with others. What if I miss a dose? Keep appointments for follow-up doses. It is important not to miss your dose. Call your care team if you are unable to keep an appointment. What may interact with this medication? Do not take this medication with any of the following: Deferoxamine Dimercaprol Other iron products This medication may also interact with the following: Chloramphenicol Deferasirox This list may not describe all possible interactions. Give your health care provider a list of all the medicines, herbs, non-prescription drugs, or dietary supplements you use. Also tell them if you smoke,  drink alcohol, or use illegal drugs. Some items may interact with your medicine. What should I watch for while using this medication? Visit your care team regularly. Tell your care team if your symptoms do not start to get better or if they get worse. You may need blood work done while you are taking this medication. You may need to follow a special diet. Talk to your care team. Foods that contain iron include: whole grains/cereals, dried fruits, beans, or peas, leafy green vegetables, and organ meats (liver, kidney). What side effects may I notice from receiving this medication? Side effects that you should report to your care team as soon as possible: Allergic reactions--skin rash, itching, hives, swelling of the face, lips, tongue, or throat Low blood pressure--dizziness, feeling faint or lightheaded, blurry vision Shortness of breath Side effects that usually do not require medical attention (report to your care team if they continue or are bothersome): Flushing Headache Joint pain Muscle pain Nausea Pain, redness, or irritation at injection site This list may not describe all possible side effects. Call your doctor for medical advice about side effects. You may report side effects to FDA at 1-800-FDA-1088. Where should I keep my medication? This medication is given in a hospital or clinic. It will not be stored at home. NOTE: This sheet is a summary. It may not cover all possible information. If you have questions about this medicine, talk to your doctor, pharmacist, or health care provider.  2024 Elsevier/Gold Standard (2022-08-03 00:00:00)

## 2023-02-05 ENCOUNTER — Inpatient Hospital Stay: Payer: BC Managed Care – PPO

## 2023-02-05 VITALS — BP 122/74 | HR 49 | Resp 16

## 2023-02-05 DIAGNOSIS — I10 Essential (primary) hypertension: Secondary | ICD-10-CM | POA: Diagnosis not present

## 2023-02-05 DIAGNOSIS — Z886 Allergy status to analgesic agent status: Secondary | ICD-10-CM | POA: Diagnosis not present

## 2023-02-05 DIAGNOSIS — G5600 Carpal tunnel syndrome, unspecified upper limb: Secondary | ICD-10-CM | POA: Diagnosis not present

## 2023-02-05 DIAGNOSIS — Z881 Allergy status to other antibiotic agents status: Secondary | ICD-10-CM | POA: Diagnosis not present

## 2023-02-05 DIAGNOSIS — Z9884 Bariatric surgery status: Secondary | ICD-10-CM | POA: Diagnosis not present

## 2023-02-05 DIAGNOSIS — Z87891 Personal history of nicotine dependence: Secondary | ICD-10-CM | POA: Diagnosis not present

## 2023-02-05 DIAGNOSIS — Z90710 Acquired absence of both cervix and uterus: Secondary | ICD-10-CM | POA: Diagnosis not present

## 2023-02-05 DIAGNOSIS — D508 Other iron deficiency anemias: Secondary | ICD-10-CM

## 2023-02-05 DIAGNOSIS — Z882 Allergy status to sulfonamides status: Secondary | ICD-10-CM | POA: Diagnosis not present

## 2023-02-05 DIAGNOSIS — Z79899 Other long term (current) drug therapy: Secondary | ICD-10-CM | POA: Diagnosis not present

## 2023-02-05 DIAGNOSIS — E785 Hyperlipidemia, unspecified: Secondary | ICD-10-CM | POA: Diagnosis not present

## 2023-02-05 DIAGNOSIS — R5383 Other fatigue: Secondary | ICD-10-CM | POA: Diagnosis not present

## 2023-02-05 DIAGNOSIS — D509 Iron deficiency anemia, unspecified: Secondary | ICD-10-CM | POA: Diagnosis not present

## 2023-02-05 DIAGNOSIS — Z880 Allergy status to penicillin: Secondary | ICD-10-CM | POA: Diagnosis not present

## 2023-02-05 DIAGNOSIS — Z9049 Acquired absence of other specified parts of digestive tract: Secondary | ICD-10-CM | POA: Diagnosis not present

## 2023-02-05 DIAGNOSIS — F32A Depression, unspecified: Secondary | ICD-10-CM | POA: Diagnosis not present

## 2023-02-05 DIAGNOSIS — Z8249 Family history of ischemic heart disease and other diseases of the circulatory system: Secondary | ICD-10-CM | POA: Diagnosis not present

## 2023-02-05 MED ORDER — IRON SUCROSE 20 MG/ML IV SOLN
200.0000 mg | Freq: Once | INTRAVENOUS | Status: AC
  Administered 2023-02-05: 200 mg via INTRAVENOUS
  Filled 2023-02-05: qty 10

## 2023-02-05 MED ORDER — DIPHENHYDRAMINE HCL 25 MG PO CAPS
50.0000 mg | ORAL_CAPSULE | Freq: Once | ORAL | Status: AC | PRN
  Administered 2023-02-05: 50 mg via ORAL
  Filled 2023-02-05: qty 2

## 2023-02-05 NOTE — Progress Notes (Signed)
Pt tolerated treatment without complaints.  VSS.  Pt refused 30 minute post observation period.  Pt understands risks.

## 2023-02-05 NOTE — Patient Instructions (Signed)
Iron Sucrose Injection What is this medication? IRON SUCROSE (EYE ern SOO krose) treats low levels of iron (iron deficiency anemia) in people with kidney disease. Iron is a mineral that plays an important role in making red blood cells, which carry oxygen from your lungs to the rest of your body. This medicine may be used for other purposes; ask your health care provider or pharmacist if you have questions. COMMON BRAND NAME(S): Venofer What should I tell my care team before I take this medication? They need to know if you have any of these conditions: Anemia not caused by low iron levels Heart disease High levels of iron in the blood Kidney disease Liver disease An unusual or allergic reaction to iron, other medications, foods, dyes, or preservatives Pregnant or trying to get pregnant Breastfeeding How should I use this medication? This medication is for infusion into a vein. It is given in a hospital or clinic setting. Talk to your care team about the use of this medication in children. While this medication may be prescribed for children as young as 2 years for selected conditions, precautions do apply. Overdosage: If you think you have taken too much of this medicine contact a poison control center or emergency room at once. NOTE: This medicine is only for you. Do not share this medicine with others. What if I miss a dose? Keep appointments for follow-up doses. It is important not to miss your dose. Call your care team if you are unable to keep an appointment. What may interact with this medication? Do not take this medication with any of the following: Deferoxamine Dimercaprol Other iron products This medication may also interact with the following: Chloramphenicol Deferasirox This list may not describe all possible interactions. Give your health care provider a list of all the medicines, herbs, non-prescription drugs, or dietary supplements you use. Also tell them if you smoke,  drink alcohol, or use illegal drugs. Some items may interact with your medicine. What should I watch for while using this medication? Visit your care team regularly. Tell your care team if your symptoms do not start to get better or if they get worse. You may need blood work done while you are taking this medication. You may need to follow a special diet. Talk to your care team. Foods that contain iron include: whole grains/cereals, dried fruits, beans, or peas, leafy green vegetables, and organ meats (liver, kidney). What side effects may I notice from receiving this medication? Side effects that you should report to your care team as soon as possible: Allergic reactions--skin rash, itching, hives, swelling of the face, lips, tongue, or throat Low blood pressure--dizziness, feeling faint or lightheaded, blurry vision Shortness of breath Side effects that usually do not require medical attention (report to your care team if they continue or are bothersome): Flushing Headache Joint pain Muscle pain Nausea Pain, redness, or irritation at injection site This list may not describe all possible side effects. Call your doctor for medical advice about side effects. You may report side effects to FDA at 1-800-FDA-1088. Where should I keep my medication? This medication is given in a hospital or clinic. It will not be stored at home. NOTE: This sheet is a summary. It may not cover all possible information. If you have questions about this medicine, talk to your doctor, pharmacist, or health care provider.  2024 Elsevier/Gold Standard (2022-08-03 00:00:00)

## 2023-02-13 ENCOUNTER — Inpatient Hospital Stay: Payer: Medicare Other | Attending: Oncology

## 2023-02-13 ENCOUNTER — Encounter: Payer: Self-pay | Admitting: Oncology

## 2023-02-13 VITALS — BP 109/63 | HR 60 | Temp 96.0°F | Resp 17

## 2023-02-13 DIAGNOSIS — D508 Other iron deficiency anemias: Secondary | ICD-10-CM

## 2023-02-13 DIAGNOSIS — Z79899 Other long term (current) drug therapy: Secondary | ICD-10-CM | POA: Diagnosis not present

## 2023-02-13 DIAGNOSIS — D509 Iron deficiency anemia, unspecified: Secondary | ICD-10-CM | POA: Insufficient documentation

## 2023-02-13 MED ORDER — SODIUM CHLORIDE 0.9% FLUSH
10.0000 mL | Freq: Once | INTRAVENOUS | Status: DC | PRN
Start: 2023-02-13 — End: 2023-02-13
  Filled 2023-02-13: qty 10

## 2023-02-13 MED ORDER — DIPHENHYDRAMINE HCL 25 MG PO CAPS
50.0000 mg | ORAL_CAPSULE | Freq: Once | ORAL | Status: DC | PRN
Start: 2023-02-13 — End: 2023-02-13

## 2023-02-13 MED ORDER — IRON SUCROSE 20 MG/ML IV SOLN
200.0000 mg | Freq: Once | INTRAVENOUS | Status: AC
  Administered 2023-02-13: 200 mg via INTRAVENOUS
  Filled 2023-02-13: qty 10

## 2023-02-13 NOTE — Progress Notes (Signed)
Pt refuses Benadryl pre Venofer. Pt reports she is aware of the s/s of an allergic reaction and the steps to take if a reaction was to happen. Per Dr. Cathie Hoops okay to proceed with Venofer without Benadryl per pt request.  1430: Pt educated to seek emergency care if delayed reaction occurs, pt reports she is aware of s/s to look for and the steps to take in the event of reaction. Pt and VS stable at discharge.

## 2023-02-20 ENCOUNTER — Inpatient Hospital Stay: Payer: Medicare Other

## 2023-02-20 VITALS — BP 121/62 | HR 60 | Temp 97.6°F | Resp 18

## 2023-02-20 DIAGNOSIS — Z79899 Other long term (current) drug therapy: Secondary | ICD-10-CM | POA: Diagnosis not present

## 2023-02-20 DIAGNOSIS — D509 Iron deficiency anemia, unspecified: Secondary | ICD-10-CM | POA: Diagnosis not present

## 2023-02-20 DIAGNOSIS — D508 Other iron deficiency anemias: Secondary | ICD-10-CM

## 2023-02-20 MED ORDER — SODIUM CHLORIDE 0.9% FLUSH
10.0000 mL | Freq: Once | INTRAVENOUS | Status: AC | PRN
  Administered 2023-02-20: 10 mL
  Filled 2023-02-20: qty 10

## 2023-02-20 MED ORDER — DIPHENHYDRAMINE HCL 25 MG PO CAPS: 50.0000 mg | ORAL_CAPSULE | Freq: Once | ORAL | Status: DC | PRN

## 2023-02-20 MED ORDER — IRON SUCROSE 20 MG/ML IV SOLN
200.0000 mg | Freq: Once | INTRAVENOUS | Status: AC
  Administered 2023-02-20: 200 mg via INTRAVENOUS
  Filled 2023-02-20: qty 10

## 2023-02-20 NOTE — Progress Notes (Signed)
Patient declined to wait the 30 minutes for post iron infusion observation today. Tolerated infusion well. VSS. 

## 2023-03-18 DIAGNOSIS — M170 Bilateral primary osteoarthritis of knee: Secondary | ICD-10-CM | POA: Diagnosis not present

## 2023-03-21 ENCOUNTER — Ambulatory Visit (INDEPENDENT_AMBULATORY_CARE_PROVIDER_SITE_OTHER): Payer: Medicare Other | Admitting: Family Medicine

## 2023-03-21 ENCOUNTER — Encounter: Payer: Self-pay | Admitting: Family Medicine

## 2023-03-21 VITALS — BP 120/78 | HR 55 | Ht 62.0 in | Wt 141.8 lb

## 2023-03-21 DIAGNOSIS — Z7984 Long term (current) use of oral hypoglycemic drugs: Secondary | ICD-10-CM

## 2023-03-21 DIAGNOSIS — Z01818 Encounter for other preprocedural examination: Secondary | ICD-10-CM

## 2023-03-21 DIAGNOSIS — E119 Type 2 diabetes mellitus without complications: Secondary | ICD-10-CM | POA: Diagnosis not present

## 2023-03-21 DIAGNOSIS — I10 Essential (primary) hypertension: Secondary | ICD-10-CM | POA: Diagnosis not present

## 2023-03-21 NOTE — Progress Notes (Signed)
 Date:  03/21/2023   Name:  Phyllis Wagner   DOB:  1955-11-24   MRN:  [id number]   Chief Complaint: Pre-op Exam (Patient presents today for her pre op exam for her left knee surgery on May 08, 2023. She is doing well. )  Patient is a 68 year old female who presents for a surgical clearnce exam. The patient reports the following problems: none. Health maintenance has been reviewed up to date.      Lab Results  Component Value Date   NA 134 01/21/2023   K 4.2 01/21/2023   CO2 24 01/21/2023   GLUCOSE 101 (H) 01/21/2023   BUN 11 01/21/2023   CREATININE 0.62 01/21/2023   CALCIUM 9.0 01/21/2023   EGFR 99 01/21/2023   GFRNONAA 96 04/14/2020   Lab Results  Component Value Date   CHOL 190 01/21/2023   HDL 93 01/21/2023   LDLCALC 87 01/21/2023   TRIG 53 01/21/2023   CHOLHDL 2.5 02/04/2018   Lab Results  Component Value Date   TSH 0.108 (L) 01/21/2023   Lab Results  Component Value Date   HGBA1C 6.6 (H) 05/07/2022   Lab Results  Component Value Date   WBC 5.0 01/21/2023   HGB 8.2 (L) 01/21/2023   HCT 28.2 (L) 01/21/2023   MCV 80 01/21/2023   PLT 350 01/21/2023   Lab Results  Component Value Date   ALT 11 01/21/2023   AST 20 01/21/2023   ALKPHOS 70 01/21/2023   BILITOT 0.2 01/21/2023   No results found for: 25OHVITD2, 25OHVITD3, VD25OH   Review of Systems  Constitutional: Negative.  Negative for chills, fatigue, fever and unexpected weight change.  HENT:  Negative for congestion, ear discharge, ear pain, rhinorrhea, sinus pressure, sneezing and sore throat.   Respiratory:  Negative for cough, shortness of breath, wheezing and stridor.   Gastrointestinal:  Negative for abdominal pain, blood in stool, constipation, diarrhea and nausea.  Genitourinary:  Negative for dysuria, flank pain, frequency, hematuria, urgency and vaginal discharge.  Musculoskeletal:  Negative for arthralgias, back pain and myalgias.  Skin:  Negative for rash.  Neurological:   Negative for dizziness, weakness and headaches.  Hematological:  Negative for adenopathy. Does not bruise/bleed easily.  Psychiatric/Behavioral:  Negative for dysphoric mood. The patient is not nervous/anxious.     Patient Active Problem List   Diagnosis Date Noted   Iron  deficiency anemia 01/24/2023   Calculus of gallbladder without cholecystitis without obstruction 06/12/2017   Chronic maxillary sinusitis 03/15/2015   Chronic rhinitis 03/15/2015   Mild intermittent asthma without complication 03/15/2015   Oroantral fistula 03/15/2015   Essential hypertension 01/05/2015   Thyroid  activity decreased 01/05/2015   Hyperlipidemia 01/05/2015   Recurrent major depressive disorder, in partial remission (HCC) 01/05/2015    Allergies  Allergen Reactions   Aspirin Anaphylaxis, Hives and Shortness Of Breath   Biaxin [Clarithromycin] Anaphylaxis, Hives and Shortness Of Breath   Nsaids Hives and Swelling   Penicillins Anaphylaxis, Hives and Swelling    Has patient had a PCN reaction causing immediate rash, facial/tongue/throat swelling, SOB or lightheadedness with hypotension:Yes Has patient had a PCN reaction causing severe rash involving mucus membranes or skin necrosis: Unknown Has patient had a PCN reaction that required hospitalization: Unknown Has patient had a PCN reaction occurring within the last 10 years: No Childhood reaction. If all of the above answers are NO, then may proceed with Cephalosporin use.    Sulfa Antibiotics Hives   Ancef  [Cefazolin ] Rash  Past Surgical History:  Procedure Laterality Date   BUNIONECTOMY Right 07/19/2021   Procedure: BUNIONECTOMY - LAPIDUS-TYPE;  Surgeon: Ashley Soulier, DPM;  Location: [facility];  Service: Podiatry;  Laterality: Right;   BUNIONECTOMY Left 02/28/2022   Procedure: BUNIONECTOMY - LAPIDUS TYPE;  Surgeon: Ashley Soulier, DPM;  Location: [facility];  Service: Podiatry;  Laterality: Left;  GENERAL WITH LOCAL    CHOLECYSTECTOMY N/A 06/21/2017   Procedure: LAPAROSCOPIC CHOLECYSTECTOMY WITH INTRAOPERATIVE CHOLANGIOGRAM;  Surgeon: Dessa Reyes ORN, MD;  Location: [facility];  Service: General;  Laterality: N/A;   COLONOSCOPY  2012   cleared for 10 yrs- [place]   EYE SURGERY Bilateral 2018   FOOT ARTHRODESIS Right 07/19/2021   Procedure: ARTHRODESIS FOOT; LISFRANC; MULTIPLE;  Surgeon: Ashley Soulier, DPM;  Location: [facility];  Service: Podiatry;  Laterality: Right;  Diabetic   GASTRIC BYPASS  2013   Dr Zelphia Robins at [facility]   KNEE SURGERY Left    LYMPH NODE DISSECTION     TONSILLECTOMY     UMBILICAL HERNIA REPAIR  06/21/2017   Procedure: HERNIA REPAIR UMBILICAL ADULT;  Surgeon: Dessa Reyes ORN, MD;  Location: [facility];  Service: General;;   VAGINAL HYSTERECTOMY     ovaries remain    Social History   Tobacco Use   Smoking status: Former    Current packs/day: 0.00    Average packs/day: 1 pack/day for 20.0 years (20.0 ttl pk-yrs)    Types: Cigarettes    Start date: 06/18/1990    Quit date: 06/18/2010    Years since quitting: 12.7   Smokeless tobacco: Never  Vaping Use   Vaping status: Never Used  Substance Use Topics   Alcohol use: Not Currently    Alcohol/week: 0.0 standard drinks of alcohol   Drug use: No     Medication list has been reviewed and updated.  Current Meds  Medication Sig   cholecalciferol (VITAMIN D) 1000 UNITS tablet Take 1,000 Units by mouth daily.   citalopram  (CELEXA ) 20 MG tablet TAKE 1 TABLET (20 MG TOTAL) BY MOUTH IN THE MORNING AND AT BEDTIME   fluticasone  (FLONASE ) 50 MCG/ACT nasal spray SPRAY 2 SPRAYS INTO EACH NOSTRIL EVERY DAY   glipiZIDE  (GLUCOTROL  XL) 2.5 MG 24 hr tablet TAKE 1 TABLET BY MOUTH EVERY DAY WITH BREAKFAST   Glucosamine-Chondroitin (COSAMIN DS PO) Take 2 tablets by mouth daily.   hydrochlorothiazide  (HYDRODIURIL ) 25 MG tablet Take 1 tablet (25 mg total) by mouth daily.   levothyroxine  (SYNTHROID ) 50 MCG tablet TAKE 1 TABLET BY MOUTH ON  MONDAY, WEDNESDAY AND FRIDAY   levothyroxine  (SYNTHROID ) 75 MCG tablet TAKE 1 TABLET BY MOUTH EVERY T, TH, Sat, and Sunday   metFORMIN (GLUCOPHAGE-XR) 500 MG 24 hr tablet Take 1 tablet (500 mg total) by mouth daily.   Omega-3 Fatty Acids (FISH OIL) 1000 MG CAPS Take 1 capsule (1,000 mg total) by mouth daily.   Polyethyl Glycol-Propyl Glycol 0.4-0.3 % SOLN Place 1 drop into both eyes 3 (three) times daily as needed (for dry eyes.).   potassium chloride (MICRO-K) 10 MEQ CR capsule Take 10 mEq by mouth daily.   simvastatin (ZOCOR) 20 MG tablet TAKE 1 TABLET BY MOUTH EVERY DAY       11 /01/2023    1:37 PM 05/07/2022    9:31 AM 01/02/2022    8:14 AM 08/15/2021    8:17 AM  GAD 7 : Generalized Anxiety Score  Nervous, Anxious, on Edge 0 0 0 0  Control/stop worrying 0 0  3 0  Worry too much - different things 0 0 3 0  Trouble relaxing 0 0 0 0  Restless 0 0 0 0  Easily annoyed or irritable 0 0 3 0  Afraid - awful might happen 0 0 0 0  Total GAD 7 Score 0 0 9 0  Anxiety Difficulty Not difficult at all Not difficult at all Somewhat difficult Not difficult at all       03/21/2023    9:32 AM 03/21/2023    9:31 AM 01/24/2023    3:10 PM  Depression screen PHQ 2/9  Decreased Interest 0 0 0  Down, Depressed, Hopeless 0 1 0  PHQ - 2 Score 0 1 0  Altered sleeping 0 1   Tired, decreased energy 0 0   Change in appetite 0 0   Feeling bad or failure about yourself  0 0   Trouble concentrating 0 0   Moving slowly or fidgety/restless 0 0   Suicidal thoughts 0 0   PHQ-9 Score 0 2   Difficult doing work/chores Not difficult at all Not difficult at all     BP Readings from Last 3 Encounters:  03/21/23 120/78  02/20/23 121/62  02/13/23 109/63    Physical Exam Vitals and nursing note reviewed.  Constitutional:      General: She is not in acute distress.    Appearance: She is not diaphoretic.  HENT:     Head: Normocephalic and atraumatic.     Right Ear: Tympanic membrane, ear canal and external  ear normal.     Left Ear: Tympanic membrane, ear canal and external ear normal.     Nose: Nose normal. No congestion or rhinorrhea.     Mouth/Throat:     Mouth: Mucous membranes are moist.     Pharynx: No oropharyngeal exudate or posterior oropharyngeal erythema.  Eyes:     General:        Right eye: No discharge.        Left eye: No discharge.     Conjunctiva/sclera: Conjunctivae normal.     Pupils: Pupils are equal, round, and reactive to light.  Neck:     Thyroid : No thyromegaly.     Vascular: No JVD.  Cardiovascular:     Rate and Rhythm: Normal rate and regular rhythm.     Heart sounds: Normal heart sounds. No murmur heard.    No friction rub. No gallop.  Pulmonary:     Effort: Pulmonary effort is normal.     Breath sounds: Normal breath sounds. No wheezing, rhonchi or rales.  Chest:     Chest wall: No tenderness.  Abdominal:     General: Bowel sounds are normal.     Palpations: Abdomen is soft. There is no mass.     Tenderness: There is no abdominal tenderness. There is no guarding or rebound.  Musculoskeletal:        General: Normal range of motion.     Cervical back: Normal range of motion and neck supple.  Lymphadenopathy:     Cervical: No cervical adenopathy.  Skin:    General: Skin is warm and dry.  Neurological:     Mental Status: She is alert.     Deep Tendon Reflexes: Reflexes are normal and symmetric.     Wt Readings from Last 3 Encounters:  03/21/23 141 lb 12.8 oz (64.3 kg)  01/24/23 144 lb 8 oz (65.5 kg)  01/22/23 143 lb (64.9 kg)    BP 120/78  Pulse (!) 55   Ht 5' 2 (1.575 m)   Wt 141 lb 12.8 oz (64.3 kg)   SpO2 98%   BMI 25.94 kg/m   Assessment and Plan: 1. Pre-op evaluation (Primary) Patient undergoing preoperative evaluation prior to surgical procedure next month.  She is also followed by hematology for iron  deficiency anemia and this will need to have clearance as well.  My examination is from the medical standpoint there is no  contraindications to upcoming surgery and EKG was done with the following results: Sinus rhythm rate 52 intervals normal.  This would suggest the patient has sinus bradycardia as that she is not on any medications in my list that is rate controlling.  Will await labs and then fill out forms for patient's corresponding surgical procedure. - EKG 12-Lead - CBC with Differential/Platelet - Microalbumin / creatinine urine ratio  2. Diabetes mellitus treated with oral medication (HCC) Chronic controlled.  Stable.  We will attempt A1c for current status of diabetic control. - Hemoglobin A1c  3. Essential hypertension Chronic.  Controlled.  Stable.  Blood pressure 120/78.  Asymptomatic. - Comprehensive metabolic panel     Cathryne Molt, MD

## 2023-03-22 ENCOUNTER — Encounter: Payer: Self-pay | Admitting: Family Medicine

## 2023-03-22 DIAGNOSIS — G5602 Carpal tunnel syndrome, left upper limb: Secondary | ICD-10-CM | POA: Diagnosis not present

## 2023-03-22 DIAGNOSIS — G5601 Carpal tunnel syndrome, right upper limb: Secondary | ICD-10-CM | POA: Diagnosis not present

## 2023-03-22 LAB — CBC WITH DIFFERENTIAL/PLATELET
Basophils Absolute: 0.1 10*3/uL (ref 0.0–0.2)
Basos: 1 %
EOS (ABSOLUTE): 0.1 10*3/uL (ref 0.0–0.4)
Eos: 2 %
Hematocrit: 36.5 % (ref 34.0–46.6)
Hemoglobin: 11.9 g/dL (ref 11.1–15.9)
Immature Grans (Abs): 0 10*3/uL (ref 0.0–0.1)
Immature Granulocytes: 0 %
Lymphocytes Absolute: 1.2 10*3/uL (ref 0.7–3.1)
Lymphs: 28 %
MCH: 27.1 pg (ref 26.6–33.0)
MCHC: 32.6 g/dL (ref 31.5–35.7)
MCV: 83 fL (ref 79–97)
Monocytes Absolute: 0.4 10*3/uL (ref 0.1–0.9)
Monocytes: 10 %
Neutrophils Absolute: 2.5 10*3/uL (ref 1.4–7.0)
Neutrophils: 59 %
Platelets: 260 10*3/uL (ref 150–450)
RBC: 4.39 x10E6/uL (ref 3.77–5.28)
WBC: 4.3 10*3/uL (ref 3.4–10.8)

## 2023-03-22 LAB — COMPREHENSIVE METABOLIC PANEL
ALT: 28 [IU]/L (ref 0–32)
AST: 29 [IU]/L (ref 0–40)
Albumin: 4.5 g/dL (ref 3.9–4.9)
Alkaline Phosphatase: 72 [IU]/L (ref 44–121)
BUN/Creatinine Ratio: 13 (ref 12–28)
BUN: 9 mg/dL (ref 8–27)
Bilirubin Total: 0.2 mg/dL (ref 0.0–1.2)
CO2: 25 mmol/L (ref 20–29)
Calcium: 9.8 mg/dL (ref 8.7–10.3)
Chloride: 101 mmol/L (ref 96–106)
Creatinine, Ser: 0.69 mg/dL (ref 0.57–1.00)
Globulin, Total: 2.6 g/dL (ref 1.5–4.5)
Glucose: 99 mg/dL (ref 70–99)
Potassium: 4.5 mmol/L (ref 3.5–5.2)
Sodium: 141 mmol/L (ref 134–144)
Total Protein: 7.1 g/dL (ref 6.0–8.5)
eGFR: 96 mL/min/{1.73_m2} (ref >59)

## 2023-03-22 LAB — HEMOGLOBIN A1C
Est. average glucose Bld gHb Est-mCnc: 117 mg/dL
Hgb A1c MFr Bld: 5.7 % — ABNORMAL HIGH (ref 4.8–5.6)

## 2023-03-31 ENCOUNTER — Other Ambulatory Visit: Payer: Self-pay | Admitting: Family Medicine

## 2023-03-31 DIAGNOSIS — E782 Mixed hyperlipidemia: Secondary | ICD-10-CM

## 2023-03-31 DIAGNOSIS — E119 Type 2 diabetes mellitus without complications: Secondary | ICD-10-CM

## 2023-04-03 DIAGNOSIS — Z1231 Encounter for screening mammogram for malignant neoplasm of breast: Secondary | ICD-10-CM | POA: Diagnosis not present

## 2023-04-03 LAB — HM MAMMOGRAPHY

## 2023-04-08 ENCOUNTER — Inpatient Hospital Stay: Payer: Medicare Other

## 2023-04-08 ENCOUNTER — Inpatient Hospital Stay: Payer: Medicare Other | Attending: Oncology

## 2023-04-08 DIAGNOSIS — Z9049 Acquired absence of other specified parts of digestive tract: Secondary | ICD-10-CM | POA: Insufficient documentation

## 2023-04-08 DIAGNOSIS — Z880 Allergy status to penicillin: Secondary | ICD-10-CM | POA: Insufficient documentation

## 2023-04-08 DIAGNOSIS — D509 Iron deficiency anemia, unspecified: Secondary | ICD-10-CM | POA: Insufficient documentation

## 2023-04-08 DIAGNOSIS — G8929 Other chronic pain: Secondary | ICD-10-CM | POA: Insufficient documentation

## 2023-04-08 DIAGNOSIS — I10 Essential (primary) hypertension: Secondary | ICD-10-CM | POA: Insufficient documentation

## 2023-04-08 DIAGNOSIS — M25569 Pain in unspecified knee: Secondary | ICD-10-CM | POA: Insufficient documentation

## 2023-04-08 DIAGNOSIS — F32A Depression, unspecified: Secondary | ICD-10-CM | POA: Diagnosis not present

## 2023-04-08 DIAGNOSIS — Z882 Allergy status to sulfonamides status: Secondary | ICD-10-CM | POA: Diagnosis not present

## 2023-04-08 DIAGNOSIS — Z881 Allergy status to other antibiotic agents status: Secondary | ICD-10-CM | POA: Insufficient documentation

## 2023-04-08 DIAGNOSIS — Z833 Family history of diabetes mellitus: Secondary | ICD-10-CM | POA: Insufficient documentation

## 2023-04-08 DIAGNOSIS — Z87891 Personal history of nicotine dependence: Secondary | ICD-10-CM | POA: Diagnosis not present

## 2023-04-08 DIAGNOSIS — Z90710 Acquired absence of both cervix and uterus: Secondary | ICD-10-CM | POA: Diagnosis not present

## 2023-04-08 DIAGNOSIS — E785 Hyperlipidemia, unspecified: Secondary | ICD-10-CM | POA: Diagnosis not present

## 2023-04-08 DIAGNOSIS — Z9884 Bariatric surgery status: Secondary | ICD-10-CM | POA: Insufficient documentation

## 2023-04-08 DIAGNOSIS — Z79899 Other long term (current) drug therapy: Secondary | ICD-10-CM | POA: Diagnosis not present

## 2023-04-08 DIAGNOSIS — Z8249 Family history of ischemic heart disease and other diseases of the circulatory system: Secondary | ICD-10-CM | POA: Diagnosis not present

## 2023-04-08 DIAGNOSIS — D508 Other iron deficiency anemias: Secondary | ICD-10-CM

## 2023-04-08 DIAGNOSIS — R5383 Other fatigue: Secondary | ICD-10-CM | POA: Insufficient documentation

## 2023-04-08 DIAGNOSIS — Z886 Allergy status to analgesic agent status: Secondary | ICD-10-CM | POA: Insufficient documentation

## 2023-04-08 LAB — RETIC PANEL
Immature Retic Fract: 7.2 % (ref 2.3–15.9)
RBC.: 3.99 MIL/uL (ref 3.87–5.11)
Retic Count, Absolute: 29.5 10*3/uL (ref 19.0–186.0)
Retic Ct Pct: 0.7 % (ref 0.4–3.1)
Reticulocyte Hemoglobin: 34.1 pg (ref >27.9)

## 2023-04-08 LAB — CBC WITH DIFFERENTIAL (CANCER CENTER ONLY)
Abs Immature Granulocytes: 0.01 10*3/uL (ref 0.00–0.07)
Basophils Absolute: 0.1 10*3/uL (ref 0.0–0.1)
Basophils Relative: 1 %
Eosinophils Absolute: 0.1 10*3/uL (ref 0.0–0.5)
Eosinophils Relative: 2 %
HCT: 34.7 % — ABNORMAL LOW (ref 36.0–46.0)
Hemoglobin: 11.1 g/dL — ABNORMAL LOW (ref 12.0–15.0)
Immature Granulocytes: 0 %
Lymphocytes Relative: 33 %
Lymphs Abs: 1.6 10*3/uL (ref 0.7–4.0)
MCH: 27.9 pg (ref 26.0–34.0)
MCHC: 32 g/dL (ref 30.0–36.0)
MCV: 87.2 fL (ref 80.0–100.0)
Monocytes Absolute: 0.5 10*3/uL (ref 0.1–1.0)
Monocytes Relative: 11 %
Neutro Abs: 2.7 10*3/uL (ref 1.7–7.7)
Neutrophils Relative %: 53 %
Platelet Count: 218 10*3/uL (ref 150–400)
RBC: 3.98 MIL/uL (ref 3.87–5.11)
RDW: 21.2 % — ABNORMAL HIGH (ref 11.5–15.5)
WBC Count: 5 10*3/uL (ref 4.0–10.5)
nRBC: 0 % (ref 0.0–0.2)

## 2023-04-08 LAB — IRON AND TIBC
Iron: 71 ug/dL (ref 28–170)
Saturation Ratios: 19 % (ref 10.4–31.8)
TIBC: 374 ug/dL (ref 250–450)
UIBC: 303 ug/dL

## 2023-04-08 LAB — FERRITIN: Ferritin: 21 ng/mL (ref 11–307)

## 2023-04-09 NOTE — Assessment & Plan Note (Signed)
Labs are reviewed and discussed with patient. Lab Results  Component Value Date   HGB 11.1 (L) 04/08/2023   TIBC 374 04/08/2023   IRONPCTSAT 19 04/08/2023   FERRITIN 21 04/08/2023    She tolerated IV venofer well without need of premed.  I recommend one extra dose of IV venofer.

## 2023-04-10 ENCOUNTER — Inpatient Hospital Stay: Payer: Medicare Other | Admitting: Oncology

## 2023-04-10 ENCOUNTER — Inpatient Hospital Stay: Payer: Medicare Other

## 2023-04-10 ENCOUNTER — Encounter: Payer: Self-pay | Admitting: Oncology

## 2023-04-10 VITALS — BP 130/74 | HR 48 | Temp 97.0°F

## 2023-04-10 VITALS — BP 140/77 | HR 56 | Temp 97.1°F | Resp 18 | Wt 145.8 lb

## 2023-04-10 DIAGNOSIS — D509 Iron deficiency anemia, unspecified: Secondary | ICD-10-CM | POA: Diagnosis not present

## 2023-04-10 DIAGNOSIS — Z880 Allergy status to penicillin: Secondary | ICD-10-CM | POA: Diagnosis not present

## 2023-04-10 DIAGNOSIS — D508 Other iron deficiency anemias: Secondary | ICD-10-CM

## 2023-04-10 DIAGNOSIS — Z9049 Acquired absence of other specified parts of digestive tract: Secondary | ICD-10-CM | POA: Diagnosis not present

## 2023-04-10 DIAGNOSIS — I10 Essential (primary) hypertension: Secondary | ICD-10-CM | POA: Diagnosis not present

## 2023-04-10 DIAGNOSIS — Z886 Allergy status to analgesic agent status: Secondary | ICD-10-CM | POA: Diagnosis not present

## 2023-04-10 DIAGNOSIS — F32A Depression, unspecified: Secondary | ICD-10-CM | POA: Diagnosis not present

## 2023-04-10 DIAGNOSIS — G8929 Other chronic pain: Secondary | ICD-10-CM | POA: Diagnosis not present

## 2023-04-10 DIAGNOSIS — E785 Hyperlipidemia, unspecified: Secondary | ICD-10-CM | POA: Diagnosis not present

## 2023-04-10 DIAGNOSIS — Z881 Allergy status to other antibiotic agents status: Secondary | ICD-10-CM | POA: Diagnosis not present

## 2023-04-10 DIAGNOSIS — Z87891 Personal history of nicotine dependence: Secondary | ICD-10-CM | POA: Diagnosis not present

## 2023-04-10 DIAGNOSIS — Z9884 Bariatric surgery status: Secondary | ICD-10-CM | POA: Insufficient documentation

## 2023-04-10 DIAGNOSIS — Z882 Allergy status to sulfonamides status: Secondary | ICD-10-CM | POA: Diagnosis not present

## 2023-04-10 DIAGNOSIS — R5383 Other fatigue: Secondary | ICD-10-CM | POA: Diagnosis not present

## 2023-04-10 DIAGNOSIS — Z79899 Other long term (current) drug therapy: Secondary | ICD-10-CM | POA: Diagnosis not present

## 2023-04-10 DIAGNOSIS — Z90710 Acquired absence of both cervix and uterus: Secondary | ICD-10-CM | POA: Diagnosis not present

## 2023-04-10 DIAGNOSIS — M25569 Pain in unspecified knee: Secondary | ICD-10-CM | POA: Diagnosis not present

## 2023-04-10 MED ORDER — DIPHENHYDRAMINE HCL 25 MG PO CAPS
50.0000 mg | ORAL_CAPSULE | Freq: Once | ORAL | Status: DC | PRN
Start: 2023-04-10 — End: 2023-04-10

## 2023-04-10 MED ORDER — SODIUM CHLORIDE 0.9% FLUSH
10.0000 mL | Freq: Once | INTRAVENOUS | Status: AC | PRN
  Administered 2023-04-10: 10 mL
  Filled 2023-04-10: qty 10

## 2023-04-10 MED ORDER — IRON SUCROSE 20 MG/ML IV SOLN
200.0000 mg | Freq: Once | INTRAVENOUS | Status: AC
  Administered 2023-04-10: 200 mg via INTRAVENOUS
  Filled 2023-04-10: qty 10

## 2023-04-10 NOTE — Assessment & Plan Note (Addendum)
Check B12, folate at next visit

## 2023-04-10 NOTE — Progress Notes (Signed)
Hematology/Oncology Progress note [phone] [phone]           REFERRING PROVIDER: Duanne Limerick, MD   CHIEF COMPLAINTS/REASON FOR VISIT:  Iron deficiency anemia, gastric bypass   ASSESSMENT & PLAN:   Iron deficiency anemia Labs are reviewed and discussed with patient. Lab Results  Component Value Date   HGB 11.1 (L) 04/08/2023   TIBC 374 04/08/2023   IRONPCTSAT 19 04/08/2023   FERRITIN 21 04/08/2023    She tolerated IV venofer well without need of premed.  I recommend one extra dose of IV venofer.    History of gastric bypass Check B12, folate at next visit.    Orders Placed This Encounter  Procedures   CBC with Differential (Cancer Center Only)    Standing Status:   Future    Expected Date:   07/09/2023    Expiration Date:   04/09/2024   Iron and TIBC    Standing Status:   Future    Expected Date:   07/09/2023    Expiration Date:   04/09/2024   Ferritin    Standing Status:   Future    Expected Date:   07/09/2023    Expiration Date:   04/09/2024   Retic Panel    Standing Status:   Future    Expected Date:   07/09/2023    Expiration Date:   04/09/2024   Vitamin B12    Standing Status:   Future    Expected Date:   07/09/2023    Expiration Date:   04/09/2024   Folate    Standing Status:   Future    Expected Date:   07/09/2023    Expiration Date:   04/09/2024   Follow up in 3 months.  All questions were answered. The patient knows to call the clinic with any problems, questions or concerns.  Rickard Patience, MD, PhD [facility] Hematology Oncology 04/10/2023   HISTORY OF PRESENTING ILLNESS:   Phyllis Wagner is a  68 y.o.  female with PMH listed below was seen in consultation at the request of  Duanne Limerick, MD  for evaluation of anemia.   The patient, with a history of gastric bypass surgery in 2012, presents with new-onset anemia and a recent drop in hemoglobin to 8.2. She reports no history of blood in the stool and had a  colonoscopy in 2022 with no significant findings. The patient has been feeling increasingly fatigued, which she attributes to the anemia. She also reports unintentional weight loss and a decreased appetite, along with a persistent craving for ice.  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 Despite these efforts, she reports feeling "washed out and exhausted." patient is not currently on any blood thinners. She has just received a prescription for iron supplements but has not started taking them yet. The patient expresses concern about her ongoing symptoms and is eager to feel better.  The patient also mentions a history of carpal tunnel syndrome and is scheduled for an outpatient procedure in the coming week. She has a known allergy to NSAIDs and aspirin, which has previously resulted in severe reactions including anaphylaxis reaction. .  INTERVAL HISTORY Phyllis Wagner is a 68 y.o. female who has above history reviewed by me today presents for follow up visit for iron deficiency anemia.  History of gastric bypass.  She tolerates IV Venofer treatments without need of premeds. Reticulocyte level has improved. She has chronic knee pain due to arthritis and there is plan for knee replacement.  MEDICAL HISTORY:  Past Medical History:  Diagnosis Date   Arthritis    knees   Carpal tunnel syndrome    Complication of anesthesia    HARD TO WAKE UP AFTER LYMPH NODE REMOVAL AND CHILDBIRTH   Depression    Diabetes mellitus without complication (HCC)    Heart murmur    History of hiatal hernia    Hyperlipidemia    Hypertension    Hypothyroidism    Thyroid disease    Wears dentures    partial upper    SURGICAL HISTORY: Past Surgical History:  Procedure Laterality Date   BUNIONECTOMY Right 07/19/2021   Procedure: BUNIONECTOMY -  LAPIDUS-TYPE;  Surgeon: Gwyneth Revels, DPM;  Location: [facility];  Service: Podiatry;  Laterality: Right;   BUNIONECTOMY Left 02/28/2022   Procedure: BUNIONECTOMY - LAPIDUS TYPE;  Surgeon: Gwyneth Revels, DPM;  Location: [facility];  Service: Podiatry;  Laterality: Left;  GENERAL WITH LOCAL   CHOLECYSTECTOMY N/A 06/21/2017   Procedure: LAPAROSCOPIC CHOLECYSTECTOMY WITH INTRAOPERATIVE CHOLANGIOGRAM;  Surgeon: Earline Mayotte, MD;  Location: [facility];  Service: General;  Laterality: N/A;   COLONOSCOPY  2012   cleared for 10 yrs- [facility]   EYE SURGERY Bilateral 2018   FOOT ARTHRODESIS Right 07/19/2021   Procedure: ARTHRODESIS FOOT; LISFRANC; MULTIPLE;  Surgeon: Gwyneth Revels, DPM;  Location: [facility];  Service: Podiatry;  Laterality: Right;  Diabetic   GASTRIC BYPASS  2013   Dr Ethelene Hal at [facility]   KNEE SURGERY Left    LYMPH NODE DISSECTION     TONSILLECTOMY     UMBILICAL HERNIA REPAIR  06/21/2017   Procedure: HERNIA REPAIR UMBILICAL ADULT;  Surgeon: Earline Mayotte, MD;  Location: [facility];  Service: General;;   VAGINAL HYSTERECTOMY     ovaries remain    SOCIAL HISTORY: Social History   Socioeconomic History   Marital status: Married    Spouse name: Not on file   Number of children: Not on file   Years of education: Not on file   Highest education level: Bachelor's degree (e.g., BA, AB, BS)  Occupational History   Not on file  Tobacco Use   Smoking status: Former    Current packs/day: 0.00    Average packs/day: 1 pack/day for 20.0 years (20.0 ttl pk-yrs)    Types: Cigarettes    Start date: 06/18/1990    Quit date: 06/18/2010    Years since quitting: 12.8   Smokeless tobacco: Never  Vaping Use   Vaping status: Never Used  Substance and Sexual Activity   Alcohol use: Not Currently    Alcohol/week: 0.0 standard drinks of alcohol   Drug use: No   Sexual activity: Yes  Other Topics Concern   Not on file  Social History Narrative   Not on file    Social Drivers of Health   Financial Resource Strain: Low Risk  (03/20/2023)   Overall Financial Resource Strain (CARDIA)    Difficulty of Paying Living Expenses: Not hard at all  Food Insecurity: No Food Insecurity (03/20/2023)   Hunger Vital Sign    Worried About Running Out of Food in the Last Year: Never true    Ran Out of Food in the Last Year: Never true  Transportation Needs: No Transportation Needs (03/20/2023)   PRAPARE - Transportation  Lack of Transportation (Medical): No    Lack of Transportation (Non-Medical): No  Physical Activity: Sufficiently Active (03/20/2023)   Exercise Vital Sign    Days of Exercise per Week: 6 days    Minutes of Exercise per Session: 70 min  Stress: No Stress Concern Present (03/20/2023)   [employer] of Occupational Health - Occupational Stress Questionnaire    Feeling of Stress : Not at all  Recent Concern: Stress - Stress Concern Present (01/23/2023)   Received from [facility] of Occupational Health - Occupational Stress Questionnaire    Feeling of Stress : To some extent  Social Connections: Socially Integrated (03/20/2023)   Social Connection and Isolation Panel [NHANES]    Frequency of Communication with Friends and Family: More than three times a week    Frequency of Social Gatherings with Friends and Family: More than three times a week    Attends Religious Services: More than 4 times per year    Active Member of [employer] or Organizations: Yes    Attends Engineer, structural: More than 4 times per year    Marital Status: Married  Catering manager Violence: Not At Risk (01/24/2023)   Humiliation, Afraid, Rape, and Kick questionnaire    Fear of Current or Ex-Partner: No    Emotionally Abused: No    Physically Abused: No    Sexually Abused: No    FAMILY HISTORY: Family History  Problem Relation Age of Onset   Heart disease Mother    Atrial fibrillation Mother    Heart disease Father     Diabetes Maternal Grandmother     ALLERGIES:  is allergic to aspirin, biaxin [clarithromycin], nsaids, penicillins, sulfa antibiotics, and ancef [cefazolin].  MEDICATIONS:  Current Outpatient Medications  Medication Sig Dispense Refill   cholecalciferol (VITAMIN D) 1000 UNITS tablet Take 1,000 Units by mouth daily.     citalopram (CELEXA) 20 MG tablet TAKE 1 TABLET (20 MG TOTAL) BY MOUTH IN THE MORNING AND AT BEDTIME 90 tablet 1   fluticasone (FLONASE) 50 MCG/ACT nasal spray SPRAY 2 SPRAYS INTO EACH NOSTRIL EVERY DAY 48 mL 11   glipiZIDE (GLUCOTROL XL) 2.5 MG 24 hr tablet TAKE 1 TABLET BY MOUTH EVERY DAY WITH BREAKFAST 90 tablet 1   Glucosamine-Chondroitin (COSAMIN DS PO) Take 2 tablets by mouth daily.     hydrochlorothiazide (HYDRODIURIL) 25 MG tablet Take 1 tablet (25 mg total) by mouth daily. 90 tablet 1   levothyroxine (SYNTHROID) 50 MCG tablet TAKE 1 TABLET BY MOUTH ON MONDAY, WEDNESDAY AND FRIDAY 90 tablet 1   levothyroxine (SYNTHROID) 75 MCG tablet TAKE 1 TABLET BY MOUTH EVERY T, TH, Sat, and Sunday 90 tablet 1   metFORMIN (GLUCOPHAGE-XR) 500 MG 24 hr tablet TAKE 1 TABLET (500 MG TOTAL) BY MOUTH DAILY. 90 tablet 1   Omega-3 Fatty Acids (FISH OIL) 1000 MG CAPS Take 1 capsule (1,000 mg total) by mouth daily. 100 capsule 12   Polyethyl Glycol-Propyl Glycol 0.4-0.3 % SOLN Place 1 drop into both eyes 3 (three) times daily as needed (for dry eyes.).     potassium chloride (MICRO-K) 10 MEQ CR capsule Take 10 mEq by mouth daily.     simvastatin (ZOCOR) 20 MG tablet TAKE 1 TABLET BY MOUTH EVERY DAY 30 tablet 5   albuterol (VENTOLIN HFA) 108 (90 Base) MCG/ACT inhaler TAKE 2 PUFFS BY MOUTH EVERY 6 HOURS AS NEEDED FOR WHEEZE OR SHORTNESS OF BREATH (Patient not taking: Reported on 04/10/2023) 6.7 each  0   EPINEPHrine 0.3 mg/0.3 mL IJ SOAJ injection Inject 0.3 mg into the muscle as needed for anaphylaxis. (Patient not taking: Reported on 04/10/2023) 1 each 1   gabapentin (NEURONTIN) 300 MG capsule  Take 300 mg by mouth at bedtime. (Patient not taking: Reported on 03/21/2023)     No current facility-administered medications for this visit.    Review of Systems  Constitutional:  Positive for fatigue. Negative for chills and fever.  HENT:   Negative for hearing loss and voice change.   Eyes:  Negative for eye problems.  Respiratory:  Negative for chest tightness and cough.   Cardiovascular:  Negative for chest pain.  Gastrointestinal:  Negative for abdominal distention, abdominal pain and blood in stool.  Endocrine: Negative for hot flashes.  Genitourinary:  Negative for difficulty urinating and frequency.   Musculoskeletal:  Negative for arthralgias.  Skin:  Negative for itching and rash.  Neurological:  Negative for extremity weakness.  Hematological:  Negative for adenopathy.  Psychiatric/Behavioral:  Negative for confusion.    PHYSICAL EXAMINATION: ECOG PERFORMANCE STATUS: 1 - Symptomatic but completely ambulatory Vitals:   04/10/23 1041  BP: (!) 140/77  Pulse: (!) 56  Resp: 18  Temp: (!) 97.1 F (36.2 C)  SpO2: 96%   Filed Weights   04/10/23 1041  Weight: 145 lb 12.8 oz (66.1 kg)    Physical Exam Constitutional:      General: She is not in acute distress. HENT:     Head: Normocephalic and atraumatic.  Eyes:     General: No scleral icterus. Cardiovascular:     Rate and Rhythm: Normal rate and regular rhythm.  Pulmonary:     Effort: Pulmonary effort is normal. No respiratory distress.     Breath sounds: No wheezing.  Abdominal:     General: Bowel sounds are normal. There is no distension.     Palpations: Abdomen is soft.  Musculoskeletal:        General: Normal range of motion.     Cervical back: Normal range of motion and neck supple.  Skin:    General: Skin is warm and dry.     Coloration: Skin is not pale.     Findings: No erythema or rash.  Neurological:     Mental Status: She is alert and oriented to person, place, and time. Mental status is at  baseline.  Psychiatric:        Mood and Affect: Mood normal.     LABORATORY DATA:  I have reviewed the data as listed    Latest Ref Rng & Units 04/08/2023    1:37 PM 03/21/2023   10:16 AM 01/21/2023    2:15 PM  CBC  WBC 4.0 - 10.5 K/uL 5.0  4.3  5.0   Hemoglobin 12.0 - 15.0 g/dL 16.1  09.6  8.2   Hematocrit 36.0 - 46.0 % 34.7  36.5  28.2   Platelets 150 - 400 K/uL 218  260  350       Latest Ref Rng & Units 03/21/2023   10:16 AM 01/21/2023    2:15 PM 02/28/2022   11:00 AM  CMP  Glucose 70 - 99 mg/dL 99  045    BUN 8 - 27 mg/dL 9  11    Creatinine 4.09 - 1.00 mg/dL 8.11  9.14    Sodium 782 - 144 mmol/L 141  134    Potassium 3.5 - 5.2 mmol/L 4.5  4.2  3.4   Chloride 96 - 106 mmol/L  101  95    CO2 20 - 29 mmol/L 25  24    Calcium 8.7 - 10.3 mg/dL 9.8  9.0    Total Protein 6.0 - 8.5 g/dL 7.1  6.8    Total Bilirubin 0.0 - 1.2 mg/dL 0.2  0.2    Alkaline Phos 44 - 121 IU/L 72  70    AST 0 - 40 IU/L 29  20    ALT 0 - 32 IU/L 28  11        RADIOGRAPHIC STUDIES: I have personally reviewed the radiological images as listed and agreed with the findings in the report. No results found.

## 2023-04-21 ENCOUNTER — Other Ambulatory Visit: Payer: Self-pay | Admitting: Family Medicine

## 2023-04-21 DIAGNOSIS — F3341 Major depressive disorder, recurrent, in partial remission: Secondary | ICD-10-CM

## 2023-04-29 ENCOUNTER — Other Ambulatory Visit: Payer: BC Managed Care – PPO

## 2023-05-01 ENCOUNTER — Ambulatory Visit: Payer: BC Managed Care – PPO | Admitting: Oncology

## 2023-05-01 ENCOUNTER — Ambulatory Visit: Payer: BC Managed Care – PPO

## 2023-05-08 DIAGNOSIS — R262 Difficulty in walking, not elsewhere classified: Secondary | ICD-10-CM | POA: Diagnosis not present

## 2023-05-08 DIAGNOSIS — M1712 Unilateral primary osteoarthritis, left knee: Secondary | ICD-10-CM | POA: Diagnosis not present

## 2023-06-06 ENCOUNTER — Other Ambulatory Visit: Payer: Self-pay | Admitting: Family Medicine

## 2023-06-06 DIAGNOSIS — E119 Type 2 diabetes mellitus without complications: Secondary | ICD-10-CM

## 2023-06-27 ENCOUNTER — Other Ambulatory Visit: Payer: Self-pay | Admitting: Family Medicine

## 2023-06-27 DIAGNOSIS — I10 Essential (primary) hypertension: Secondary | ICD-10-CM

## 2023-07-08 ENCOUNTER — Telehealth: Payer: Self-pay | Admitting: Oncology

## 2023-07-08 ENCOUNTER — Inpatient Hospital Stay: Payer: Medicare Other | Attending: Oncology

## 2023-07-08 DIAGNOSIS — Z9884 Bariatric surgery status: Secondary | ICD-10-CM | POA: Insufficient documentation

## 2023-07-08 DIAGNOSIS — D509 Iron deficiency anemia, unspecified: Secondary | ICD-10-CM | POA: Insufficient documentation

## 2023-07-08 DIAGNOSIS — D508 Other iron deficiency anemias: Secondary | ICD-10-CM

## 2023-07-08 LAB — CBC WITH DIFFERENTIAL (CANCER CENTER ONLY)
Abs Immature Granulocytes: 0.01 10*3/uL (ref 0.00–0.07)
Basophils Absolute: 0.1 10*3/uL (ref 0.0–0.1)
Basophils Relative: 2 %
Eosinophils Absolute: 0.1 10*3/uL (ref 0.0–0.5)
Eosinophils Relative: 3 %
HCT: 38.6 % (ref 36.0–46.0)
Hemoglobin: 12.6 g/dL (ref 12.0–15.0)
Immature Granulocytes: 0 %
Lymphocytes Relative: 31 %
Lymphs Abs: 1.5 10*3/uL (ref 0.7–4.0)
MCH: 31.3 pg (ref 26.0–34.0)
MCHC: 32.6 g/dL (ref 30.0–36.0)
MCV: 95.8 fL (ref 80.0–100.0)
Monocytes Absolute: 0.3 10*3/uL (ref 0.1–1.0)
Monocytes Relative: 7 %
Neutro Abs: 2.8 10*3/uL (ref 1.7–7.7)
Neutrophils Relative %: 57 %
Platelet Count: 268 10*3/uL (ref 150–400)
RBC: 4.03 MIL/uL (ref 3.87–5.11)
RDW: 12.3 % (ref 11.5–15.5)
WBC Count: 4.8 10*3/uL (ref 4.0–10.5)
nRBC: 0 % (ref 0.0–0.2)

## 2023-07-08 LAB — RETIC PANEL
Immature Retic Fract: 4.6 % (ref 2.3–15.9)
RBC.: 4 MIL/uL (ref 3.87–5.11)
Retic Count, Absolute: 36 10*3/uL (ref 19.0–186.0)
Retic Ct Pct: 0.9 % (ref 0.4–3.1)
Reticulocyte Hemoglobin: 34.5 pg (ref >27.9)

## 2023-07-08 LAB — VITAMIN B12: Vitamin B-12: 2295 pg/mL — ABNORMAL HIGH (ref 180–914)

## 2023-07-08 LAB — IRON AND TIBC
Iron: 77 ug/dL (ref 28–170)
Saturation Ratios: 22 % (ref 10.4–31.8)
TIBC: 349 ug/dL (ref 250–450)
UIBC: 272 ug/dL

## 2023-07-08 LAB — FERRITIN: Ferritin: 32 ng/mL (ref 11–307)

## 2023-07-08 LAB — FOLATE: Folate: 13.6 ng/mL (ref >5.9)

## 2023-07-08 NOTE — Telephone Encounter (Signed)
 Patient called to ask if she will still need her appointment 4/30. She states she looked at the results on mychart and dont think she needs them. Please advise.

## 2023-07-10 ENCOUNTER — Inpatient Hospital Stay: Payer: Medicare Other | Admitting: Oncology

## 2023-07-10 ENCOUNTER — Inpatient Hospital Stay: Payer: Medicare Other

## 2023-07-10 ENCOUNTER — Other Ambulatory Visit: Payer: Self-pay | Admitting: Family Medicine

## 2023-07-10 DIAGNOSIS — E782 Mixed hyperlipidemia: Secondary | ICD-10-CM

## 2024-01-07 ENCOUNTER — Other Ambulatory Visit: Payer: Self-pay

## 2024-01-07 DIAGNOSIS — D508 Other iron deficiency anemias: Secondary | ICD-10-CM

## 2024-01-08 ENCOUNTER — Inpatient Hospital Stay: Attending: Oncology | Admitting: Oncology

## 2024-01-08 ENCOUNTER — Inpatient Hospital Stay

## 2024-01-08 ENCOUNTER — Encounter: Payer: Self-pay | Admitting: Oncology

## 2024-01-08 VITALS — BP 111/82 | HR 70 | Temp 97.8°F | Resp 16 | Wt 151.0 lb

## 2024-01-08 DIAGNOSIS — Z90710 Acquired absence of both cervix and uterus: Secondary | ICD-10-CM | POA: Diagnosis not present

## 2024-01-08 DIAGNOSIS — Z79899 Other long term (current) drug therapy: Secondary | ICD-10-CM | POA: Diagnosis not present

## 2024-01-08 DIAGNOSIS — E039 Hypothyroidism, unspecified: Secondary | ICD-10-CM | POA: Diagnosis not present

## 2024-01-08 DIAGNOSIS — Z833 Family history of diabetes mellitus: Secondary | ICD-10-CM | POA: Diagnosis not present

## 2024-01-08 DIAGNOSIS — I10 Essential (primary) hypertension: Secondary | ICD-10-CM | POA: Diagnosis not present

## 2024-01-08 DIAGNOSIS — Z880 Allergy status to penicillin: Secondary | ICD-10-CM | POA: Insufficient documentation

## 2024-01-08 DIAGNOSIS — Z9049 Acquired absence of other specified parts of digestive tract: Secondary | ICD-10-CM | POA: Insufficient documentation

## 2024-01-08 DIAGNOSIS — E785 Hyperlipidemia, unspecified: Secondary | ICD-10-CM | POA: Diagnosis not present

## 2024-01-08 DIAGNOSIS — D508 Other iron deficiency anemias: Secondary | ICD-10-CM | POA: Diagnosis not present

## 2024-01-08 DIAGNOSIS — Z886 Allergy status to analgesic agent status: Secondary | ICD-10-CM | POA: Insufficient documentation

## 2024-01-08 DIAGNOSIS — F32A Depression, unspecified: Secondary | ICD-10-CM | POA: Diagnosis not present

## 2024-01-08 DIAGNOSIS — Z881 Allergy status to other antibiotic agents status: Secondary | ICD-10-CM | POA: Diagnosis not present

## 2024-01-08 DIAGNOSIS — R5383 Other fatigue: Secondary | ICD-10-CM | POA: Insufficient documentation

## 2024-01-08 DIAGNOSIS — D509 Iron deficiency anemia, unspecified: Secondary | ICD-10-CM | POA: Diagnosis present

## 2024-01-08 DIAGNOSIS — Z87891 Personal history of nicotine dependence: Secondary | ICD-10-CM | POA: Diagnosis not present

## 2024-01-08 DIAGNOSIS — Z8249 Family history of ischemic heart disease and other diseases of the circulatory system: Secondary | ICD-10-CM | POA: Diagnosis not present

## 2024-01-08 DIAGNOSIS — Z9884 Bariatric surgery status: Secondary | ICD-10-CM | POA: Insufficient documentation

## 2024-01-08 DIAGNOSIS — G5600 Carpal tunnel syndrome, unspecified upper limb: Secondary | ICD-10-CM | POA: Diagnosis not present

## 2024-01-08 DIAGNOSIS — Z882 Allergy status to sulfonamides status: Secondary | ICD-10-CM | POA: Insufficient documentation

## 2024-01-08 LAB — CBC WITH DIFFERENTIAL (CANCER CENTER ONLY)
Abs Immature Granulocytes: 0.01 K/uL (ref 0.00–0.07)
Basophils Absolute: 0.1 K/uL (ref 0.0–0.1)
Basophils Relative: 1 %
Eosinophils Absolute: 0.2 K/uL (ref 0.0–0.5)
Eosinophils Relative: 3 %
HCT: 38.8 % (ref 36.0–46.0)
Hemoglobin: 12.9 g/dL (ref 12.0–15.0)
Immature Granulocytes: 0 %
Lymphocytes Relative: 36 %
Lymphs Abs: 1.9 K/uL (ref 0.7–4.0)
MCH: 31.7 pg (ref 26.0–34.0)
MCHC: 33.2 g/dL (ref 30.0–36.0)
MCV: 95.3 fL (ref 80.0–100.0)
Monocytes Absolute: 0.5 K/uL (ref 0.1–1.0)
Monocytes Relative: 9 %
Neutro Abs: 2.6 K/uL (ref 1.7–7.7)
Neutrophils Relative %: 51 %
Platelet Count: 210 K/uL (ref 150–400)
RBC: 4.07 MIL/uL (ref 3.87–5.11)
RDW: 12.2 % (ref 11.5–15.5)
WBC Count: 5.3 K/uL (ref 4.0–10.5)
nRBC: 0 % (ref 0.0–0.2)

## 2024-01-08 LAB — VITAMIN B12: Vitamin B-12: 659 pg/mL (ref 180–914)

## 2024-01-08 LAB — IRON AND TIBC
Iron: 104 ug/dL (ref 28–170)
Saturation Ratios: 29 % (ref 10.4–31.8)
TIBC: 364 ug/dL (ref 250–450)
UIBC: 260 ug/dL

## 2024-01-08 LAB — RETIC PANEL
Immature Retic Fract: 4.9 % (ref 2.3–15.9)
RBC.: 4.15 MIL/uL (ref 3.87–5.11)
Retic Count, Absolute: 40.3 K/uL (ref 19.0–186.0)
Retic Ct Pct: 1 % (ref 0.4–3.1)
Reticulocyte Hemoglobin: 35.7 pg (ref >27.9)

## 2024-01-08 LAB — FOLATE: Folate: 12.6 ng/mL (ref >5.9)

## 2024-01-08 LAB — FERRITIN: Ferritin: 21 ng/mL (ref 11–307)

## 2024-01-08 NOTE — Progress Notes (Signed)
 Hematology/Oncology Progress note [phone] [phone]           REFERRING PROVIDER: Joshua Cathryne BROCKS, MD   CHIEF COMPLAINTS/REASON FOR VISIT:  Iron  deficiency anemia, gastric bypass   ASSESSMENT & PLAN:   Iron  deficiency anemia Labs are reviewed and discussed with patient. Lab Results  Component Value Date   HGB 12.9 01/08/2024   TIBC 364 01/08/2024   IRONPCTSAT 29 01/08/2024   FERRITIN 21 01/08/2024    She tolerated IV venofer  well  I recommend one dose of maintenance IV venofer .    History of gastric bypass Adequate B12, folate level    Orders Placed This Encounter  Procedures   CBC with Differential (Cancer Center Only)    Standing Status:   Future    Expected Date:   04/09/2024    Expiration Date:   07/08/2024   Iron  and TIBC    Standing Status:   Future    Expected Date:   04/09/2024    Expiration Date:   07/08/2024   Ferritin    Standing Status:   Future    Expected Date:   04/09/2024    Expiration Date:   07/08/2024   Follow up in 6 months.  All questions were answered. The patient knows to call the clinic with any problems, questions or concerns.  Zelphia Cap, MD, PhD [facility] Hematology Oncology 01/08/2024   HISTORY OF PRESENTING ILLNESS:   Phyllis Wagner is a  68 y.o.  female with PMH listed below was seen in consultation at the request of  Joshua Cathryne BROCKS, MD  for evaluation of anemia.   The patient, with a history of gastric bypass surgery in 2012, presents with new-onset anemia and a recent drop in hemoglobin to 8.2. She reports no history of blood in the stool and had a colonoscopy in 2022 with no significant findings. The patient has been feeling increasingly fatigued, which she attributes to the anemia. She also reports unintentional weight loss and a decreased appetite, along with a persistent craving for ice.  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 Despite these efforts, she reports feeling washed out and exhausted. patient is not currently on any blood thinners. She has just received a prescription for iron  supplements but has not started taking them yet. The patient expresses concern about her ongoing symptoms and is eager to feel better.  The patient also mentions a history of carpal tunnel syndrome and is scheduled for an outpatient procedure in the coming week. She has a known allergy to NSAIDs and aspirin, which has previously resulted in severe reactions including anaphylaxis reaction. .  INTERVAL HISTORY Phyllis Wagner is a 68 y.o. female who has above history reviewed by me today presents for follow up visit for iron  deficiency anemia.  History of gastric bypass.  She tolerates IV Venofer  treatments without need of premeds.  S/p left knee replacement.  MEDICAL HISTORY:  Past Medical History:  Diagnosis Date   Arthritis    knees   Carpal tunnel syndrome    Complication of anesthesia    HARD TO WAKE UP AFTER LYMPH NODE REMOVAL AND CHILDBIRTH   Depression    Diabetes mellitus without complication (HCC)    Heart murmur    History of hiatal hernia    Hyperlipidemia    Hypertension  Hypothyroidism    Thyroid  disease    Wears dentures    partial upper    SURGICAL HISTORY: Past Surgical History:  Procedure Laterality Date   BUNIONECTOMY Right 07/19/2021   Procedure: BUNIONECTOMY - LAPIDUS-TYPE;  Surgeon: Ashley Soulier, DPM;  Location: [facility];  Service: Podiatry;  Laterality: Right;   BUNIONECTOMY Left 02/28/2022   Procedure: BUNIONECTOMY - LAPIDUS TYPE;  Surgeon: Ashley Soulier, DPM;  Location: [facility];  Service: Podiatry;  Laterality: Left;  GENERAL WITH LOCAL   CHOLECYSTECTOMY N/A 06/21/2017   Procedure: LAPAROSCOPIC CHOLECYSTECTOMY WITH INTRAOPERATIVE CHOLANGIOGRAM;  Surgeon:  Dessa Reyes ORN, MD;  Location: [facility];  Service: General;  Laterality: N/A;   COLONOSCOPY  2012   cleared for 10 yrs- [facility]   EYE SURGERY Bilateral 2018   FOOT ARTHRODESIS Right 07/19/2021   Procedure: ARTHRODESIS FOOT; LISFRANC; MULTIPLE;  Surgeon: Ashley Soulier, DPM;  Location: [facility];  Service: Podiatry;  Laterality: Right;  Diabetic   GASTRIC BYPASS  2013   Dr Zelphia Robins at [facility]   KNEE SURGERY Left    LYMPH NODE DISSECTION     TONSILLECTOMY     UMBILICAL HERNIA REPAIR  06/21/2017   Procedure: HERNIA REPAIR UMBILICAL ADULT;  Surgeon: Dessa Reyes ORN, MD;  Location: [facility];  Service: General;;   VAGINAL HYSTERECTOMY     ovaries remain    SOCIAL HISTORY: Social History   Socioeconomic History   Marital status: Married    Spouse name: Not on file   Number of children: Not on file   Years of education: Not on file   Highest education level: Bachelor's degree (e.g., BA, AB, BS)  Occupational History   Not on file  Tobacco Use   Smoking status: Former    Current packs/day: 0.00    Average packs/day: 1 pack/day for 20.0 years (20.0 ttl pk-yrs)    Types: Cigarettes    Start date: 06/18/1990    Quit date: 06/18/2010    Years since quitting: 13.5   Smokeless tobacco: Never  Vaping Use   Vaping status: Never Used  Substance and Sexual Activity   Alcohol use: Not Currently    Alcohol/week: 0.0 standard drinks of alcohol   Drug use: No   Sexual activity: Yes  Other Topics Concern   Not on file  Social History Narrative   Not on file   Social Drivers of Health   Financial Resource Strain: Low Risk  (10/18/2023)   Received from [facility]   Overall Financial Resource Strain (CARDIA)    Difficulty of Paying Living Expenses: Not hard at all  Food Insecurity: No Food Insecurity (10/18/2023)   Received from [facility]   Hunger Vital Sign    Within the past 12 months, you worried that your food would run out before you  got the money to buy more.: Never true    Within the past 12 months, the food you bought just didn't last and you didn't have money to get more.: Never true  Transportation Needs: No Transportation Needs (10/18/2023)   Received from [facility] - Transportation    In the past 12 months, has lack of transportation kept you from medical appointments or from getting medications?: No    Lack of Transportation (Non-Medical): No  Physical Activity: Sufficiently Active (03/20/2023)   Exercise Vital Sign    Days of Exercise per Week: 6 days    Minutes of Exercise per Session:  70 min  Stress: No Stress Concern Present (03/20/2023)   [employer] of Occupational Health - Occupational Stress Questionnaire    Feeling of Stress : Not at all  Recent Concern: Stress - Stress Concern Present (01/23/2023)   Received from [facility] of Occupational Health - Occupational Stress Questionnaire    Feeling of Stress : To some extent  Social Connections: Socially Integrated (03/20/2023)   Social Connection and Isolation Panel    Frequency of Communication with Friends and Family: More than three times a week    Frequency of Social Gatherings with Friends and Family: More than three times a week    Attends Religious Services: More than 4 times per year    Active Member of [employer] or Organizations: Yes    Attends Engineer, Structural: More than 4 times per year    Marital Status: Married  Catering Manager Violence: Not At Risk (01/24/2023)   Humiliation, Afraid, Rape, and Kick questionnaire    Fear of Current or Ex-Partner: No    Emotionally Abused: No    Physically Abused: No    Sexually Abused: No    FAMILY HISTORY: Family History  Problem Relation Age of Onset   Heart disease Mother    Atrial fibrillation Mother    Heart disease Father    Diabetes Maternal Grandmother     ALLERGIES:  is allergic to aspirin, biaxin  [clarithromycin], nsaids, penicillins, sulfa antibiotics, and ancef  [cefazolin ].  MEDICATIONS:  Current Outpatient Medications  Medication Sig Dispense Refill   ascorbic acid (VITAMIN C) 500 MG tablet Take 500 mg by mouth 2 (two) times daily.     cholecalciferol (VITAMIN D) 1000 UNITS tablet Take 1,000 Units by mouth daily.     citalopram  (CELEXA ) 20 MG tablet TAKE 1 TABLET (20 MG TOTAL) BY MOUTH IN THE MORNING AND AT BEDTIME 180 tablet 1   fluticasone  (FLONASE ) 50 MCG/ACT nasal spray SPRAY 2 SPRAYS INTO EACH NOSTRIL EVERY DAY 48 mL 11   gabapentin (NEURONTIN) 300 MG capsule Take 300 mg by mouth at bedtime.     glipiZIDE  (GLUCOTROL  XL) 2.5 MG 24 hr tablet TAKE 1 TABLET BY MOUTH EVERY DAY WITH BREAKFAST 30 tablet 5   Glucosamine-Chondroitin (COSAMIN DS PO) Take 2 tablets by mouth daily.     hydrochlorothiazide  (HYDRODIURIL ) 25 MG tablet TAKE 1 TABLET (25 MG TOTAL) BY MOUTH DAILY. 30 tablet 2   levothyroxine  (SYNTHROID ) 50 MCG tablet TAKE 1 TABLET BY MOUTH ON MONDAY, WEDNESDAY AND FRIDAY 90 tablet 1   levothyroxine  (SYNTHROID ) 75 MCG tablet TAKE 1 TABLET BY MOUTH EVERY T, TH, Sat, and Sunday 90 tablet 1   metFORMIN  (GLUCOPHAGE -XR) 500 MG 24 hr tablet TAKE 1 TABLET (500 MG TOTAL) BY MOUTH DAILY. 90 tablet 1   mirtazapine (REMERON SOL-TAB) 15 MG disintegrating tablet Take 15 mg by mouth at bedtime.     Omega-3 Fatty Acids (FISH OIL ) 1000 MG CAPS Take 1 capsule (1,000 mg total) by mouth daily. 100 capsule 12   Polyethyl Glycol-Propyl Glycol 0.4-0.3 % SOLN Place 1 drop into both eyes 3 (three) times daily as needed (for dry eyes.).     potassium chloride  (MICRO-K ) 10 MEQ CR capsule Take 10 mEq by mouth daily.     simvastatin  (ZOCOR ) 20 MG tablet TAKE 1 TABLET BY MOUTH EVERY DAY 90 tablet 1   vitamin B-12 (CYANOCOBALAMIN ) 500 MCG tablet Take 1,000 mcg by mouth every other day.     albuterol  (VENTOLIN  HFA)  108 (90 Base) MCG/ACT inhaler TAKE 2 PUFFS BY MOUTH EVERY 6 HOURS AS NEEDED FOR WHEEZE OR  SHORTNESS OF BREATH (Patient not taking: Reported on 01/08/2024) 6.7 each 0   EPINEPHrine  0.3 mg/0.3 mL IJ SOAJ injection Inject 0.3 mg into the muscle as needed for anaphylaxis. (Patient not taking: Reported on 01/08/2024) 1 each 1   No current facility-administered medications for this visit.    Review of Systems  Constitutional:  Positive for fatigue. Negative for chills and fever.  HENT:   Negative for hearing loss and voice change.   Eyes:  Negative for eye problems.  Respiratory:  Negative for chest tightness and cough.   Cardiovascular:  Negative for chest pain.  Gastrointestinal:  Negative for abdominal distention, abdominal pain and blood in stool.  Endocrine: Negative for hot flashes.  Genitourinary:  Negative for difficulty urinating and frequency.   Musculoskeletal:  Negative for arthralgias.  Skin:  Negative for itching and rash.  Neurological:  Negative for extremity weakness.  Hematological:  Negative for adenopathy.  Psychiatric/Behavioral:  Negative for confusion.    PHYSICAL EXAMINATION: ECOG PERFORMANCE STATUS: 1 - Symptomatic but completely ambulatory Vitals:   01/08/24 1055  BP: 111/82  Pulse: 70  Resp: 16  Temp: 97.8 F (36.6 C)  SpO2: 98%   Filed Weights   01/08/24 1055  Weight: 151 lb (68.5 kg)    Physical Exam Constitutional:      General: She is not in acute distress. HENT:     Head: Normocephalic and atraumatic.  Eyes:     General: No scleral icterus. Cardiovascular:     Rate and Rhythm: Normal rate and regular rhythm.  Pulmonary:     Effort: Pulmonary effort is normal. No respiratory distress.     Breath sounds: No wheezing.  Abdominal:     General: Bowel sounds are normal. There is no distension.     Palpations: Abdomen is soft.  Musculoskeletal:        General: Normal range of motion.     Cervical back: Normal range of motion and neck supple.  Skin:    General: Skin is warm and dry.     Coloration: Skin is not pale.     Findings:  No erythema or rash.  Neurological:     Mental Status: She is alert and oriented to person, place, and time. Mental status is at baseline.  Psychiatric:        Mood and Affect: Mood normal.     LABORATORY DATA:  I have reviewed the data as listed    Latest Ref Rng & Units 01/08/2024   10:35 AM 07/08/2023    9:27 AM 04/08/2023    1:37 PM  CBC  WBC 4.0 - 10.5 K/uL 5.3  4.8  5.0   Hemoglobin 12.0 - 15.0 g/dL 87.0  87.3  88.8   Hematocrit 36.0 - 46.0 % 38.8  38.6  34.7   Platelets 150 - 400 K/uL 210  268  218       Latest Ref Rng & Units 03/21/2023   10:16 AM 01/21/2023    2:15 PM 02/28/2022   11:00 AM  CMP  Glucose 70 - 99 mg/dL 99  898    BUN 8 - 27 mg/dL 9  11    Creatinine 9.42 - 1.00 mg/dL 9.30  9.37    Sodium 865 - 144 mmol/L 141  134    Potassium 3.5 - 5.2 mmol/L 4.5  4.2  3.4   Chloride 96 -  106 mmol/L 101  95    CO2 20 - 29 mmol/L 25  24    Calcium 8.7 - 10.3 mg/dL 9.8  9.0    Total Protein 6.0 - 8.5 g/dL 7.1  6.8    Total Bilirubin 0.0 - 1.2 mg/dL 0.2  0.2    Alkaline Phos 44 - 121 IU/L 72  70    AST 0 - 40 IU/L 29  20    ALT 0 - 32 IU/L 28  11        RADIOGRAPHIC STUDIES: I have personally reviewed the radiological images as listed and agreed with the findings in the report. No results found.

## 2024-01-08 NOTE — Assessment & Plan Note (Addendum)
 Labs are reviewed and discussed with patient. Lab Results  Component Value Date   HGB 12.9 01/08/2024   TIBC 364 01/08/2024   IRONPCTSAT 29 01/08/2024   FERRITIN 21 01/08/2024    She tolerated IV venofer  well  I recommend one dose of maintenance IV venofer .

## 2024-01-08 NOTE — Assessment & Plan Note (Signed)
 Adequate B12, folate level

## 2024-03-23 ENCOUNTER — Encounter: Payer: Self-pay | Admitting: Ophthalmology

## 2024-03-24 NOTE — Discharge Instructions (Signed)
 INSTRUCTIONS FOLLOWING OCULOPLASTIC SURGERY Phyllis EMERSON GAY, MD  AFTER YOUR EYE SURGERY, THER ARE MANY THINGS WHICH YOU, THE PATIENT, CAN DO TO ASSURE THE BEST POSSIBLE RESULT FROM YOUR OPERATION.  THIS SHEET SHOULD BE REFERRED TO WHENEVER QUESTIONS ARISE.  IF THERE ARE ANY QUESTIONS NOT ANSWERED HERE, DO NOT HESITATE TO CALL OUR OFFICE AT [phone] OR [phone].  THERE IS ALWAYS SOMEONE AVAILABLE TO CALL IF QUESTIONS OR PROBLEMS ARISE.  VISION: Your vision may be blurred and out of focus after surgery until you are able to stop using your ointment, swelling resolves and your eye(s) heal. This may take 1 to 2 weeks at the least.  If your vision becomes gradually more dim or dark, this is not normal and you need to call our office immediately.  EYE CARE: For the first 48 hours after surgery, use ice packs frequently - "20 minutes on, 20 minutes off" - to help reduce swelling and bruising.  Small bags of frozen peas or corn make good ice packs along with cloths soaked in ice water.  If you are wearing a patch or other type of dressing following surgery, keep this on for the amount of time specified by your doctor.  For the first week following surgery, you will need to treat your stitches with great care.  It is OK to shower, but take care to not allow soapy water to run into your eye(s) to help reduce chances of infection.  You may gently clean the eyelashes and around the eye(s) with cotton balls and bottled water, BUT DO NOT RUB THE STITCHES VIGOROUSLY.  Keeping your stitches moist with ointment will help promote healing with minimal scar formation.  ACTIVITY: When you leave the surgery center, you should go home, rest and be inactive.  The eye(s) may feel scratchy and keeping the eyes closed will allow for faster healing.  The first week following surgery, avoid straining (anything making the face turn red) or lifting over 20 pounds.  Additionally, avoid bending which causes your head to go below  your waist.  Using your eyes will NOT harm them, so feel free to read, watch television, use the computer, etc as desired.  Driving depends on each individual, so check with your doctor if you have questions about driving. Do not wear contact lenses for about 2 weeks.  Do not wear eye makeup for 2 weeks.  Avoid swimming, hot tubs, gardening, and dusting for 1 to 2 weeks to reduce the risk of an infection.  MEDICATIONS:  You will be given a prescription for an ointment to use 4 times a day on your stitches.  You can use the ointment in your eyes if they feel scratchy or irritated.  If you eyelid(s) don't close completely when you sleep, put some ointment in your eyes before bedtime.  EMERGENCY: If you experience SEVERE EYE PAIN OR HEADACHE UNRELIEVED BY TYLENOL OR TRAMADOL , NAUSEA OR VOMITING, WORSENING REDNESS, OR WORSENING VISION (ESPECIALLY VISION THAT WAS INITIALLY BETTER) CALL [phone] OR [phone] DURING BUSINESS HOURS OR AFTER HOURS.

## 2024-03-27 ENCOUNTER — Other Ambulatory Visit: Payer: Self-pay

## 2024-03-27 ENCOUNTER — Ambulatory Visit
Admission: RE | Admit: 2024-03-27 | Discharge: 2024-03-27 | Disposition: A | Discharge status: Home / Self Care | Attending: Ophthalmology | Admitting: Ophthalmology

## 2024-03-27 ENCOUNTER — Encounter: Payer: Self-pay | Admitting: Ophthalmology

## 2024-03-27 ENCOUNTER — Ambulatory Visit: Payer: Self-pay | Admitting: Anesthesiology

## 2024-03-27 ENCOUNTER — Encounter: Payer: Self-pay | Admitting: Anesthesiology

## 2024-03-27 ENCOUNTER — Encounter: Admission: RE | Disposition: A | Payer: Self-pay | Source: Home / Self Care | Attending: Ophthalmology

## 2024-03-27 DIAGNOSIS — H02834 Dermatochalasis of left upper eyelid: Secondary | ICD-10-CM | POA: Diagnosis present

## 2024-03-27 DIAGNOSIS — I10 Essential (primary) hypertension: Secondary | ICD-10-CM | POA: Diagnosis not present

## 2024-03-27 DIAGNOSIS — H02831 Dermatochalasis of right upper eyelid: Secondary | ICD-10-CM | POA: Diagnosis not present

## 2024-03-27 DIAGNOSIS — Z87891 Personal history of nicotine dependence: Secondary | ICD-10-CM | POA: Diagnosis not present

## 2024-03-27 HISTORY — DX: Prediabetes: R73.03

## 2024-03-27 HISTORY — PX: BROW LIFT: SHX178

## 2024-03-27 MED ORDER — MIDAZOLAM HCL 2 MG/2ML IJ SOLN
INTRAMUSCULAR | Status: AC
  Filled 2024-03-27: qty 2

## 2024-03-27 MED ORDER — FENTANYL CITRATE (PF) 100 MCG/2ML IJ SOLN
INTRAMUSCULAR | Status: DC | PRN
  Administered 2024-03-27: 50 ug via INTRAVENOUS
  Administered 2024-03-27 (×2): 25 ug via INTRAVENOUS

## 2024-03-27 MED ORDER — ONDANSETRON HCL 4 MG/2ML IJ SOLN
INTRAMUSCULAR | Status: AC
  Filled 2024-03-27: qty 2

## 2024-03-27 MED ORDER — LIDOCAINE HCL (CARDIAC) PF 100 MG/5ML IV SOSY
PREFILLED_SYRINGE | INTRAVENOUS | Status: DC | PRN
  Administered 2024-03-27: 70 mg via INTRAVENOUS

## 2024-03-27 MED ORDER — LIDOCAINE-EPINEPHRINE 2 %-1:100000 IJ SOLN
INTRAMUSCULAR | Status: DC | PRN
  Administered 2024-03-27: 3 mL via OPHTHALMIC

## 2024-03-27 MED ORDER — PROPOFOL 10 MG/ML IV BOLUS
INTRAVENOUS | Status: AC
  Filled 2024-03-27: qty 40

## 2024-03-27 MED ORDER — TETRACAINE HCL 0.5 % OP SOLN
OPHTHALMIC | Status: DC | PRN
  Administered 2024-03-27: 2 [drp] via OPHTHALMIC

## 2024-03-27 MED ORDER — BSS IO SOLN
INTRAOCULAR | Status: DC | PRN
  Administered 2024-03-27: 15 mL via INTRAOCULAR

## 2024-03-27 MED ORDER — ONDANSETRON HCL 4 MG/2ML IJ SOLN
INTRAMUSCULAR | Status: DC | PRN
  Administered 2024-03-27: 4 mg via INTRAVENOUS

## 2024-03-27 MED ORDER — EPHEDRINE 5 MG/ML INJ
INTRAVENOUS | Status: AC
  Filled 2024-03-27: qty 5

## 2024-03-27 MED ORDER — EPHEDRINE SULFATE (PRESSORS) 25 MG/5ML IV SOSY
PREFILLED_SYRINGE | INTRAVENOUS | Status: DC | PRN
  Administered 2024-03-27 (×2): 5 mg via INTRAVENOUS

## 2024-03-27 MED ORDER — LIDOCAINE HCL (PF) 2 % IJ SOLN
INTRAMUSCULAR | Status: AC
  Filled 2024-03-27: qty 5

## 2024-03-27 MED ORDER — FENTANYL CITRATE (PF) 100 MCG/2ML IJ SOLN
INTRAMUSCULAR | Status: AC
  Filled 2024-03-27: qty 2

## 2024-03-27 MED ORDER — PROPOFOL 500 MG/50ML IV EMUL
INTRAVENOUS | Status: DC | PRN
  Administered 2024-03-27: 40 mg via INTRAVENOUS
  Administered 2024-03-27: 120 ug/kg/min via INTRAVENOUS

## 2024-03-27 MED ORDER — TRAMADOL HCL 50 MG PO TABS: ORAL_TABLET | ORAL | 0 refills | Status: AC

## 2024-03-27 MED ORDER — LACTATED RINGERS IV SOLN: INTRAVENOUS | Status: DC

## 2024-03-27 MED ORDER — ERYTHROMYCIN 5 MG/GM OP OINT: TOPICAL_OINTMENT | OPHTHALMIC | 2 refills | Status: AC

## 2024-03-27 MED ORDER — MIDAZOLAM HCL (PF) 2 MG/2ML IJ SOLN
INTRAMUSCULAR | Status: DC | PRN
  Administered 2024-03-27: 2 mg via INTRAVENOUS

## 2024-03-27 NOTE — Anesthesia Preprocedure Evaluation (Signed)
"                                    Anesthesia Evaluation  Patient identified by MRN, date of birth, ID band Patient awake    Reviewed: Allergy & Precautions, H&P , NPO status , Patient's Chart, lab work & pertinent test results  Airway Mallampati: II  TM Distance: >3 FB Neck ROM: Full    Dental no notable dental hx. (+) Upper Dentures, Lower Dentures   Pulmonary neg pulmonary ROS, former smoker   Pulmonary exam normal breath sounds clear to auscultation       Cardiovascular hypertension, negative cardio ROS Normal cardiovascular exam Rhythm:Regular Rate:Normal     Neuro/Psych negative neurological ROS  negative psych ROS   GI/Hepatic negative GI ROS, Neg liver ROS,,,  Endo/Other  negative endocrine ROS    Renal/GU negative Renal ROS  negative genitourinary   Musculoskeletal negative musculoskeletal ROS (+)    Abdominal   Peds negative pediatric ROS (+)  Hematology negative hematology ROS (+)   Anesthesia Other Findings   Reproductive/Obstetrics negative OB ROS                              Anesthesia Physical Anesthesia Plan  ASA: 2  Anesthesia Plan: General   Post-op Pain Management:    Induction: Intravenous  PONV Risk Score and Plan:   Airway Management Planned:   Additional Equipment:   Intra-op Plan:   Post-operative Plan: Extubation in OR  Informed Consent: I have reviewed the patients History and Physical, chart, labs and discussed the procedure including the risks, benefits and alternatives for the proposed anesthesia with the patient or authorized representative who has indicated his/her understanding and acceptance.     Dental advisory given  Plan Discussed with: CRNA  Anesthesia Plan Comments:         Anesthesia Quick Evaluation  "

## 2024-03-27 NOTE — H&P (Signed)
 [facility]  Primary Care Physician:  Salli Amato, MD Ophthalmologist: Dr. Greig HERO. Ashley, M.D.  Pre-Procedure History & Physical: HPI:  Phyllis Wagner is a 69 y.o. female here for periocular surgery.   Past Medical History:  Diagnosis Date   Arthritis    knees   Carpal tunnel syndrome    Complication of anesthesia    HARD TO WAKE UP AFTER LYMPH NODE REMOVAL AND CHILDBIRTH   Depression    History of hiatal hernia    Hyperlipidemia    Hypertension    Hypothyroidism    Pre-diabetes    Thyroid  disease    Wears dentures    partial upper    Past Surgical History:  Procedure Laterality Date   BUNIONECTOMY Right 07/19/2021   Procedure: BUNIONECTOMY - LAPIDUS-TYPE;  Surgeon: Ashley Soulier, DPM;  Location: [facility];  Service: Podiatry;  Laterality: Right;   BUNIONECTOMY Left 02/28/2022   Procedure: BUNIONECTOMY - LAPIDUS TYPE;  Surgeon: Ashley Soulier, DPM;  Location: [facility];  Service: Podiatry;  Laterality: Left;  GENERAL WITH LOCAL   CHOLECYSTECTOMY N/A 06/21/2017   Procedure: LAPAROSCOPIC CHOLECYSTECTOMY WITH INTRAOPERATIVE CHOLANGIOGRAM;  Surgeon: Dessa Reyes ORN, MD;  Location: [facility];  Service: General;  Laterality: N/A;   COLONOSCOPY  2012   cleared for 10 yrs- [place] [facility]   EYE SURGERY Bilateral 2018   FOOT ARTHRODESIS Right 07/19/2021   Procedure: ARTHRODESIS FOOT; LISFRANC; MULTIPLE;  Surgeon: Ashley Soulier, DPM;  Location: [facility];  Service: Podiatry;  Laterality: Right;  Diabetic   GASTRIC BYPASS  2013   Dr Zelphia Robins at [facility]   KNEE SURGERY Left    LYMPH NODE DISSECTION     TONSILLECTOMY     UMBILICAL HERNIA REPAIR  06/21/2017   Procedure: HERNIA REPAIR UMBILICAL ADULT;  Surgeon: Dessa Reyes ORN, MD;  Location: [facility];  Service: General;;   VAGINAL HYSTERECTOMY     ovaries remain    Prior to Admission medications  Medication Sig Start Date End Date Taking? Authorizing Provider  ascorbic  acid (VITAMIN C) 500 MG tablet Take 1,000 mg by mouth daily.   Yes [provider]  cholecalciferol (VITAMIN D) 1000 UNITS tablet Take 2,000 Units by mouth daily.   Yes [provider]  citalopram  (CELEXA ) 20 MG tablet TAKE 1 TABLET (20 MG TOTAL) BY MOUTH IN THE MORNING AND AT BEDTIME 04/23/23  Yes Joshua Cathryne BROCKS, MD  docusate sodium (COLACE) 50 MG capsule Take 50 mg by mouth 2 (two) times daily. Takes 4 gummies   Yes [provider]  gabapentin (NEURONTIN) 300 MG capsule Take 300 mg by mouth as needed. 11/10/21  Yes [provider]  hydrochlorothiazide  (HYDRODIURIL ) 25 MG tablet TAKE 1 TABLET (25 MG TOTAL) BY MOUTH DAILY. 06/28/23  Yes Joshua Cathryne BROCKS, MD  levothyroxine  (SYNTHROID ) 50 MCG tablet TAKE 1 TABLET BY MOUTH ON MONDAY, WEDNESDAY AND FRIDAY 01/21/23  Yes Joshua Cathryne BROCKS, MD  levothyroxine  (SYNTHROID ) 75 MCG tablet TAKE 1 TABLET BY MOUTH EVERY T, TH, Sat, and Sunday 01/21/23  Yes Joshua Cathryne BROCKS, MD  Omega-3 Fatty Acids (FISH OIL ) 1000 MG CAPS Take 1 capsule (1,000 mg total) by mouth daily. 10/13/19  Yes Joshua Cathryne BROCKS, MD  potassium chloride  (MICRO-K ) 10 MEQ CR capsule Take 10 mEq by mouth daily.   Yes [provider]  simvastatin  (ZOCOR ) 20 MG tablet TAKE 1 TABLET BY MOUTH EVERY DAY 07/12/23  Yes Jones, Deanna C, MD  vitamin B-12 (  CYANOCOBALAMIN ) 500 MCG tablet Take 1,000 mcg by mouth every other day.   Yes [provider]  albuterol  (VENTOLIN  HFA) 108 (90 Base) MCG/ACT inhaler TAKE 2 PUFFS BY MOUTH EVERY 6 HOURS AS NEEDED FOR WHEEZE OR SHORTNESS OF BREATH 10/18/20   Jones, Deanna C, MD  fluticasone  (FLONASE ) 50 MCG/ACT nasal spray SPRAY 2 SPRAYS INTO EACH NOSTRIL EVERY DAY Patient taking differently: 2 sprays as needed. SPRAY 2 SPRAYS INTO EACH NOSTRIL EVERY DAY 01/22/23   Jones, Deanna C, MD  Glucosamine-Chondroitin (COSAMIN DS PO) Take 2 tablets by mouth daily.    [provider]  mirtazapine (REMERON SOL-TAB) 15 MG disintegrating  tablet Take 15 mg by mouth as needed.    [provider]  Polyethyl Glycol-Propyl Glycol 0.4-0.3 % SOLN Place 1 drop into both eyes in the morning, at noon, in the evening, and at bedtime.    [provider]    Allergies as of 01/06/2024 - Review Complete 04/10/2023  Allergen Reaction Noted   Aspirin Anaphylaxis, Hives, and Shortness Of Breath 01/05/2015   Biaxin [clarithromycin] Anaphylaxis, Hives, and Shortness Of Breath 01/18/2015   Nsaids Hives and Swelling 04/15/2015   Penicillins Anaphylaxis, Hives, and Swelling 01/05/2015   Sulfa antibiotics Hives 01/04/2016   Ancef  [cefazolin ] Rash 06/21/2017    Family History  Problem Relation Age of Onset   Heart disease Mother    Atrial fibrillation Mother    Heart disease Father    Diabetes Maternal Grandmother     Social History   Socioeconomic History   Marital status: Married    Spouse name: Not on file   Number of children: Not on file   Years of education: Not on file   Highest education level: Bachelor's degree (e.g., BA, AB, BS)  Occupational History   Not on file  Tobacco Use   Smoking status: Former    Current packs/day: 0.00    Average packs/day: 1 pack/day for 20.0 years (20.0 ttl pk-yrs)    Types: Cigarettes    Start date: 06/18/1990    Quit date: 06/18/2010    Years since quitting: 13.7   Smokeless tobacco: Never  Vaping Use   Vaping status: Never Used  Substance and Sexual Activity   Alcohol use: Yes    Comment: rarely   Drug use: No   Sexual activity: Yes  Other Topics Concern   Not on file  Social History Narrative   Not on file   Social Drivers of Health   Tobacco Use: Medium Risk (03/27/2024)   Patient History    Smoking Tobacco Use: Former    Smokeless Tobacco Use: Never    Passive Exposure: Not on Actuary Strain: Low Risk  (01/31/2024)   [facility] from [facility]   Overall Financial Resource Strain (CARDIA)    Difficulty of Paying Living  Expenses: Not hard at all  Food Insecurity: No Food Insecurity (01/31/2024)   Received from [facility]   Epic    Within the past 12 months, you worried that your food would run out before you got the money to buy more.: Never true    Within the past 12 months, the food you bought just didn't last and you didn't have money to get more.: Never true  Transportation Needs: No Transportation Needs (01/31/2024)   Received from [id number] - Transportation    In the past 12 months, has lack of transportation kept you from medical appointments  or from getting medications?: No    Lack of Transportation (Non-Medical): No  Physical Activity: Sufficiently Active (03/20/2023)   Exercise Vital Sign    Days of Exercise per Week: 6 days    Minutes of Exercise per Session: 70 min  Stress: No Stress Concern Present (03/20/2023)   [employer] [facility] [employer] [facility] - Occupational Stress Questionnaire    Feeling of Stress : Not at all  Recent Concern: Stress - Stress Concern Present (01/23/2023)   Received from [facility] - Occupational Stress Questionnaire    Feeling of Stress : To some extent  Social Connections: Socially Integrated (03/20/2023)   Social Connection and Isolation Panel    Frequency of Communication with Friends and Family: More than three times a week    Frequency of Social Gatherings with Friends and Family: More than three times a week    Attends Religious Services: More than 4 times per year    Active Member of [employer] or Organizations: Yes    Attends Engineer, Structural: More than 4 times per year    Marital Status: Married  Catering Manager Violence: Not At Risk (01/24/2023)   Humiliation, Afraid, Rape, and Kick questionnaire    Fear of Current or Ex-Partner: No    Emotionally Abused: No    Physically Abused: No    Sexually Abused: No  Depression (PHQ2-9): Low  Risk (01/08/2024)   Depression (PHQ2-9)    PHQ-2 Score: 0  Alcohol Screen: Low Risk (03/20/2023)   Alcohol Screen    Last Alcohol Screening Score (AUDIT): 1  Housing: Low Risk  (01/31/2024)   Received from [facility]   Epic    In the last 12 months, was there a time when you were not able to pay the mortgage or rent on time?: No    In the past 12 months, how many times have you moved where you were living?: 0    At any time in the past 12 months, were you homeless or living in a shelter (including now)?: No  Utilities: Not At Risk (01/31/2024)   Received from [facility]   Epic    In the past 12 months has the electric, gas, oil, or water company threatened to shut off services in your home?: No  Health Literacy: Not on file    Review of Systems: See HPI, otherwise negative ROS  Physical Exam: BP 126/70   Pulse (!) 52   Temp (!) 96.8 F (36 C) (Temporal)   Resp 12   Ht 5' 2.99 (1.6 m)   Wt 70 kg   SpO2 97%   BMI 27.36 kg/m  General:   Alert and cooperative in NAD Head:  Normocephalic and atraumatic. Respiratory:  Normal work of breathing.  Impression/Plan: Phyllis Wagner is here for periocular surgery.  Risks, benefits, limitations, and alternatives regarding surgery have been reviewed with the patient.  Questions have been answered.  All parties agreeable.   Ashley Greig HERO, MD  03/27/2024, 7:31 AM  "

## 2024-03-27 NOTE — Interval H&P Note (Signed)
 History and Physical Interval Note:  03/27/2024 7:32 AM  Phyllis Wagner  has presented today for surgery, with the diagnosis of Bilateral Dermatochalasis.  The various methods of treatment have been discussed with the patient and family. After consideration of risks, benefits and other options for treatment, the patient has consented to  Procedures: BLEPHAROPLASTY (Bilateral) as a surgical intervention.  The patient's history has been reviewed, patient examined, no change in status, stable for surgery.  I have reviewed the patient's chart and labs.  Questions were answered to the patient's satisfaction.     Ashley, Zabria Liss M

## 2024-03-27 NOTE — Op Note (Signed)
 Preoperative Diagnosis:  Visually significant dermatochalasis bilateral  Upper Eyelid(s)  Postoperative Diagnosis:  Same.  Procedure(s) Performed:   Upper eyelid blepharoplasty with excess skin excision  both  Upper Eyelid(s)  Surgeon: Greig HERO. Ashley, M.D.  Assistants: none  Anesthesia: MAC  Specimens: None.  Estimated Blood Loss: Minimal.  Complications: None.  Operative Findings: None Dictated  Procedure:   Allergies were reviewed and the patient is allergic to Aspirin, Biaxin [clarithromycin], Nsaids, Penicillins, Sulfa antibiotics, and Ancef  [cefazolin ].   After the risks, benefits, complications and alternatives were discussed with the patient, appropriate informed consent was obtained and the patient was brought to the operating suite. The patient was reclined supine and a timeout was conducted.  The patient was then sedated.  Local anesthetic consisting of a 50-50 mixture of 2% lidocaine  with epinephrine  and 0.75% bupivacaine  with added Hylenex  was injected subcutaneously to both  upper eyelid(s). After adequate local was instilled, the patient was prepped and draped in the usual sterile fashion for eyelid surgery.   Attention was turned to the upper eyelids. A 8mm upper eyelid crease incision line was marked with calipers on both  upper eyelid(s).  A pinch test was used to estimate the amount of excess skin to remove and this was marked in standard blepharoplasty style fashion. Attention was turned to the  right  upper eyelid. A #15 blade was used to open the premarked incision line. A Skin and muscle flap was excised and hemostasis was obtained with bipolar cautery. A buttonhole was created medially in orbicularis and orbital septum to reveal the medial fat pocket. This was dissected free from fascial attachments, cauterized towards the pedicle base and excised to produce a nice flattening of the medial corner of the upper eyelid.  Attention was then turned to the opposite  eyelid where the same procedure was performed in the same manner. Hemostasis was obtained with bipolar cautery throughout. All incisions were then closed with a combination of running and interrupted 6-0 fast absorbing plain suture. The patient tolerated the procedure well.  Erythromycin  ophthalmic ointment was applied to her incision sites, followed by ice packs. She was taken to the recovery area where she recovered without difficulty.  Post-Op Plan/Instructions:  The patient was instructed to use ice packs frequently for the next 48 hours. She was instructed to use Erythromycin  ophthalmic ointment on her incisions 4 times a day for the next 12 to 14 days. She was given a prescription for tramadol  (or similar) for pain control should Tylenol  not be effective. She was asked to to follow up in 2-3 weeks' time at the [facility] in [place], [place] or sooner as needed for problems.  Ilene Witcher M. Ashley, M.D. Ophthalmology

## 2024-03-27 NOTE — Anesthesia Postprocedure Evaluation (Signed)
"   Anesthesia Post Note  Patient: Phyllis Wagner  Procedure(s) Performed: BLEPHAROPLASTY (Bilateral: Eye)  Patient location during evaluation: PACU Anesthesia Type: MAC Level of consciousness: awake and alert Pain management: pain level controlled Vital Signs Assessment: post-procedure vital signs reviewed and stable Respiratory status: spontaneous breathing, nonlabored ventilation, respiratory function stable and patient connected to nasal cannula oxygen Cardiovascular status: blood pressure returned to baseline and stable Postop Assessment: no apparent nausea or vomiting Anesthetic complications: no   No notable events documented.   Last Vitals:  Vitals:   03/27/24 0827 03/27/24 0833  BP: 107/69 111/62  Pulse: 60 64  Resp: 14 15  Temp: (!) 36.3 C (!) 36.3 C  SpO2: 93% 94%    Last Pain:  Vitals:   03/27/24 0833  TempSrc:   PainSc: 0-No pain                 [name]      "

## 2024-03-27 NOTE — Transfer of Care (Signed)
 Immediate Anesthesia Transfer of Care Note  Patient: Phyllis Wagner  Procedure(s) Performed: BLEPHAROPLASTY (Bilateral: Eye)  Patient Location: PACU  Anesthesia Type: General  Level of Consciousness: awake, alert  and patient cooperative  Airway and Oxygen Therapy: Patient Spontanous Breathing and Patient connected to supplemental oxygen  Post-op Assessment: Post-op Vital signs reviewed, Patient's Cardiovascular Status Stable, Respiratory Function Stable, Patent Airway and No signs of Nausea or vomiting  Post-op Vital Signs: Reviewed and stable  Complications: No notable events documented.

## 2024-07-08 ENCOUNTER — Inpatient Hospital Stay: Admitting: Oncology

## 2024-07-08 ENCOUNTER — Inpatient Hospital Stay
